# Patient Record
Sex: Male | Born: 1955 | Race: White | Hispanic: No | Marital: Married | Smoking: Never smoker
Health system: Southern US, Community
[De-identification: ages and names within clinical notes are randomized; demographics above are authoritative.]

## PROBLEM LIST (undated history)

## (undated) DIAGNOSIS — I10 Essential (primary) hypertension: Secondary | ICD-10-CM

## (undated) HISTORY — DX: Essential (primary) hypertension: I10

## (undated) HISTORY — PX: HERNIA REPAIR: SHX51

---

## 2008-01-05 ENCOUNTER — Encounter (INDEPENDENT_AMBULATORY_CARE_PROVIDER_SITE_OTHER): Payer: Self-pay | Admitting: General Surgery

## 2008-01-05 ENCOUNTER — Observation Stay (HOSPITAL_COMMUNITY): Admission: RE | Admit: 2008-01-05 | Discharge: 2008-01-08 | Payer: Self-pay | Admitting: General Surgery

## 2011-02-08 ENCOUNTER — Other Ambulatory Visit: Payer: Self-pay | Admitting: Gastroenterology

## 2011-02-23 NOTE — Consult Note (Signed)
Shane Sampson, Shane Sampson                 ACCOUNT NO.:  [id number]   MEDICAL RECORD NO.:  [id number]          PATIENT TYPE:  OIB   LOCATION:  1540                         FACILITY:  [facility]   PHYSICIAN:  Beckey Rutter, MD  DATE OF BIRTH:  05/02/1956   DATE OF CONSULTATION:  01/06/2008  DATE OF DISCHARGE:                                 CONSULTATION   PRIMARY CARE PHYSICIAN:  Unassigned to [facility].   REQUESTING PHYSICIAN:  Lorne Skeens. Hoxworth, M.D.   HISTORY:  Shane Sampson is a pleasant Caucasian male with past medical  history significant for hypertension who came in yesterday with rectal  bleeding and he underwent hemorrhoidectomy.  Since the operation the  patient was noticed to have extremely elevated blood pressure.  At  around 10:00 after surgery the patient's systolic blood pressure was 230  and diastolic was 110.  The patient had improvement in the systolic  blood pressure reading but the diastolic remained more than 100 all the  time.  Last blood pressure was 155/107.  The patient is known to have  hypertension and he was following up with his primary care physician who  increased the dose of Vasotec from 10 to 15 recently and the plan is to  follow up with her again to further adjust the blood pressure  medication.  The patient denied headache, sweating or any other  symptoms.   PAST MEDICAL HISTORY:  Significant for hypertension.   SOCIAL HISTORY:  The patient is married and lives with his wife.   PAST SURGICAL HISTORY:  Now postoperative day 1 is status post  hemorrhoidectomy.   MEDICATIONS:  Vasotec 15 mg daily.   REVIEW OF SYSTEMS:  Noncontributory.   PHYSICAL EXAMINATION:  VITAL SIGNS:  Recent vitals showing temperature  98.5, pulse 109, blood pressure is 155/107, saturation is 92.  HEENT:  Head atraumatic, normocephalic.  PERRL.  Mouth moist.  No ulcer.  NECK:  Supple.  No JVD.  HEART:  Precordium first and second heart sounds audible, no added  sound.  LUNGS:   Bilateral air entry.  ABDOMEN:  Soft, nontender.  Bowel sounds present.  EXTREMITIES:  No lower extremity edema.   LABS AND X-RAYS:  CBC done on January 05, 2008, showing white blood count  of 13.3, hematocrit is 44.8, hemoglobin is 8.1, platelet count of 396.  On January 05, 2008, his sodium is 141, potassium 4.2, chloride 110,  bicarb is 22, glucose 117, BUN is 31, creatinine is 1.94.  EKG done  January 05, 2008, was showing sinus bradycardia with ventricular rate 105.  There is questionable T wave in lead 2, 3 and aVF.   ASSESSMENT AND PLAN:  This is a 55 year old pleasant Caucasian status  post hemorrhoidectomy now with uncontrolled blood pressure.  The patient  has multiple reasons for elevated blood pressure on a background of  uncontrolled hypertension to start with.  The patient is on normal  saline fluid as well as the recent history of the surgical intervention.  Nevertheless, the patient is having some tachycardia as well with his  condition and for that  I will suggest to rule the patient out for acute  coronary syndrome.   For the blood pressure the patient currently is taking Vasotec 15 mg as  well as clonidine 0.2 mg p.o. twice a day.  I would continue on the  Vasotec at the current dose but I will add calcium channel blocker and  beta-blocker and hopefully the patient will be discharged on those  medications instead of the clonidine.  We will need to continue to  monitor blood pressure and adjust medication accordingly.   Next, the patient's creatinine is 1.94 which is obviously elevated as  well as the BUN 31.  The calculated glomerular filtration rate is 38%.  The patient obviously has renal insufficiency which could be acute but  given the history of the prolonged uncontrolled hypertension chronic  kidney disease is also possible in this setting.  I think the settling  factor for this is old records from his primary physician, this renal  imparement  is  obviously is a  contributory factor to his uncontrolled  hypertension.  I would suggest to continue on IV fluid hydration and the  patient can have further evaluation for chronic disease as well as  better control of blood pressure as an outpatient if the patient is  stable from the surgical point of view.   Thank you very much for the consultation.  We will follow through with  you.      Beckey Rutter, MD  Electronically Signed     EME/MEDQ  D:  01/06/2008  T:  01/06/2008  Job:  [id number]

## 2011-02-23 NOTE — Op Note (Signed)
Shane Sampson, FANCHER                 ACCOUNT NO.:  [id number]   MEDICAL RECORD NO.:  [id number]          PATIENT TYPE:  INP   LOCATION:  1540                         FACILITY:  [facility]   PHYSICIAN:  Sharlet Salina T. Hoxworth, M.D.DATE OF BIRTH:  08/22/1956   DATE OF PROCEDURE:  01/05/2008  DATE OF DISCHARGE:                               OPERATIVE REPORT   PREOPERATIVE DIAGNOSIS:  Severe thrombosed combination hemorrhoids with  prolapse.   POSTOPERATIVE DIAGNOSIS:  Severe thrombosed combination hemorrhoids with  prolapse.   SURGICAL PROCEDURE:  Extensive hemorrhoidectomy.   SURGEON:  Lorne Skeens. Hoxworth, M.D.   ANESTHESIA:  General.   BRIEF HISTORY:  [name] is a 55 year old male with only minimal  previous hemorrhoidal symptoms who presents with 5 days of gradually  worsening swelling, pain and now bleeding from hemorrhoids.  Examination  in the office revealed severe nearly circumferential thrombosed,  prolapsed, inflamed hemorrhoids with some bleeding as well.  This was  associated with severe pain.  We have recommended proceeding to the  operating room for urgent hemorrhoidectomy.  The nature of the  procedure, its indications, risks of bleeding, infection, expected  recovery were discussed and understood.  He was brought to the operating  room for this procedure.   DESCRIPTION OF OPERATION:  The patient was brought to the operating room  and placed in supine position on the operating room table and general  endotracheal anesthesia was induced.  He was carefully positioned in  lithotomy position and perineum sterilely prepped and draped.  He  received preoperative antibiotics.  External rectal exam revealed  severely edematous, thrombosed internal and external hemorrhoids with  prolapse.  The rectal retractor was placed and the anus gently dilated.  The hemorrhoids could be somewhat grouped into left lateral, right  anterior and right posterior groups with the left lateral  and the right  posterior being the most severe.  The right posterior group was  approached first.  A 3-0 chromic suture was placed in the rectum at the  apex of the hemorrhoidal group.  The hemorrhoidal group was then  elliptically excised out on to the anoderm and then carefully dissected  up off of the anal sphincter carefully protecting the sphincter.  There  were multiple large thromboses.  The group was completely excised.  Some  of the thrombosed hemorrhoids on either side were undermined slightly to  remove these and then the rectal mucosa and anoderm were closed with the  previously placed 3-0 chromic suture in running locking fashion  internally to externally.  Following this the left lateral group was  excised in an identical fashion.  The right anterior group was  significantly smaller and a more limited external excision was performed  here and closed with a running 3-0 chromic.  Blood loss was about 100  mL.  There did not appear to be any significant stenosis of the anal  canal  following the procedure.  All the suture lines were intact and without  bleeding.  A Gelfoam pack was placed.  A perianal block with Marcaine  was performed.  Sponges and  needle counts were correct.  The patient was  taken to recovery in satisfactory condition.      Lorne Skeens. Hoxworth, M.D.  Electronically Signed     BTH/MEDQ  D:  01/05/2008  T:  01/06/2008  Job:  [id number]

## 2011-07-05 LAB — BASIC METABOLIC PANEL
Calcium: 10
GFR calc Af Amer: 44 — ABNORMAL LOW
GFR calc non Af Amer: 37 — ABNORMAL LOW
GFR calc non Af Amer: 50 — ABNORMAL LOW
Potassium: 3.5
Potassium: 4.2
Sodium: 139
Sodium: 141

## 2011-07-05 LAB — CARDIAC PANEL(CRET KIN+CKTOT+MB+TROPI)
CK, MB: 0.9
CK, MB: 1.2
CK, MB: 1.7
Relative Index: 0.7
Relative Index: 0.7
Relative Index: 1.3
Relative Index: INVALID
Total CK: 161
Total CK: 173
Total CK: 83
Total CK: 97
Troponin I: 0.01
Troponin I: 0.02

## 2011-07-05 LAB — CBC
Hemoglobin: 12.5 — ABNORMAL LOW
Hemoglobin: 16.1
Platelets: 287
RBC: 5.11
RDW: 12.7
RDW: 13
WBC: 11.5 — ABNORMAL HIGH
WBC: 13.3 — ABNORMAL HIGH

## 2015-01-27 ENCOUNTER — Ambulatory Visit (INDEPENDENT_AMBULATORY_CARE_PROVIDER_SITE_OTHER): Payer: 59 | Admitting: Internal Medicine

## 2015-01-27 ENCOUNTER — Ambulatory Visit (INDEPENDENT_AMBULATORY_CARE_PROVIDER_SITE_OTHER): Payer: 59

## 2015-01-27 VITALS — BP 120/82 | HR 112 | Temp 98.2°F | Resp 18 | Ht 73.0 in | Wt 179.0 lb

## 2015-01-27 DIAGNOSIS — R059 Cough, unspecified: Secondary | ICD-10-CM

## 2015-01-27 DIAGNOSIS — J189 Pneumonia, unspecified organism: Secondary | ICD-10-CM

## 2015-01-27 DIAGNOSIS — R05 Cough: Secondary | ICD-10-CM | POA: Diagnosis not present

## 2015-01-27 DIAGNOSIS — R61 Generalized hyperhidrosis: Secondary | ICD-10-CM | POA: Diagnosis not present

## 2015-01-27 DIAGNOSIS — R6883 Chills (without fever): Secondary | ICD-10-CM

## 2015-01-27 DIAGNOSIS — H109 Unspecified conjunctivitis: Secondary | ICD-10-CM

## 2015-01-27 LAB — POCT CBC
GRANULOCYTE PERCENT: 89.1 % — AB (ref 37–80)
HEMATOCRIT: 49.5 % (ref 43.5–53.7)
HEMOGLOBIN: 17.1 g/dL (ref 14.1–18.1)
Lymph, poc: 1.8 (ref 0.6–3.4)
MCH: 29.9 pg (ref 27–31.2)
MCHC: 34.6 g/dL (ref 31.8–35.4)
MCV: 86.5 fL (ref 80–97)
MID (CBC): 0.4 (ref 0–0.9)
MPV: 7.1 fL (ref 0–99.8)
PLATELET COUNT, POC: 509 10*3/uL — AB (ref 142–424)
POC Granulocyte: 17.8 — AB (ref 2–6.9)
POC LYMPH PERCENT: 8.9 %L — AB (ref 10–50)
POC MID %: 2 %M (ref 0–12)
RBC: 5.72 M/uL (ref 4.69–6.13)
RDW, POC: 13.3 %
WBC: 20 10*3/uL — AB (ref 4.6–10.2)

## 2015-01-27 LAB — POCT INFLUENZA A/B
INFLUENZA B, POC: NEGATIVE
Influenza A, POC: NEGATIVE

## 2015-01-27 MED ORDER — CEFTRIAXONE SODIUM 1 G IJ SOLR
1.0000 g | Freq: Once | INTRAMUSCULAR | Status: AC
  Administered 2015-01-27: 1 g via INTRAMUSCULAR

## 2015-01-27 MED ORDER — OFLOXACIN 0.3 % OP SOLN: 1.0000 [drp] | OPHTHALMIC | Status: DC

## 2015-01-27 MED ORDER — AZITHROMYCIN 500 MG PO TABS: 500.0000 mg | ORAL_TABLET | Freq: Every day | ORAL | Status: DC

## 2015-01-27 MED ORDER — HYDROCODONE-ACETAMINOPHEN 7.5-325 MG/15ML PO SOLN: 5.0000 mL | Freq: Four times a day (QID) | ORAL | Status: DC | PRN

## 2015-01-27 NOTE — Patient Instructions (Signed)
Bacterial Conjunctivitis Bacterial conjunctivitis, commonly called pink eye, is an inflammation of the clear membrane that covers the white part of the eye (conjunctiva). The inflammation can also happen on the underside of the eyelids. The blood vessels in the conjunctiva become inflamed, causing the eye to become red or pink. Bacterial conjunctivitis may spread easily from one eye to another and from person to person (contagious).  CAUSES  Bacterial conjunctivitis is caused by bacteria. The bacteria may come from your own skin, your upper respiratory tract, or from someone else with bacterial conjunctivitis. SYMPTOMS  The normally white color of the eye or the underside of the eyelid is usually pink or red. The pink eye is usually associated with irritation, tearing, and some sensitivity to light. Bacterial conjunctivitis is often associated with a thick, yellowish discharge from the eye. The discharge may turn into a crust on the eyelids overnight, which causes your eyelids to stick together. If a discharge is present, there may also be some blurred vision in the affected eye. DIAGNOSIS  Bacterial conjunctivitis is diagnosed by your caregiver through an eye exam and the symptoms that you report. Your caregiver looks for changes in the surface tissues of your eyes, which may point to the specific type of conjunctivitis. A sample of any discharge may be collected on a cotton-tip swab if you have a severe case of conjunctivitis, if your cornea is affected, or if you keep getting repeat infections that do not respond to treatment. The sample will be sent to a lab to see if the inflammation is caused by a bacterial infection and to see if the infection will respond to antibiotic medicines. TREATMENT   Bacterial conjunctivitis is treated with antibiotics. Antibiotic eyedrops are most often used. However, antibiotic ointments are also available. Antibiotics pills are sometimes used. Artificial tears or eye  washes may ease discomfort. HOME CARE INSTRUCTIONS   To ease discomfort, apply a cool, clean washcloth to your eye for 10-20 minutes, 3-4 times a day.  Gently wipe away any drainage from your eye with a warm, wet washcloth or a cotton ball.  Wash your hands often with soap and water. Use paper towels to dry your hands.  Do not share towels or washcloths. This may spread the infection.  Change or wash your pillowcase every day.  You should not use eye makeup until the infection is gone.  Do not operate machinery or drive if your vision is blurred.  Stop using contact lenses. Ask your caregiver how to sterilize or replace your contacts before using them again. This depends on the type of contact lenses that you use.  When applying medicine to the infected eye, do not touch the edge of your eyelid with the eyedrop bottle or ointment tube. SEEK IMMEDIATE MEDICAL CARE IF:   Your infection has not improved within 3 days after beginning treatment.  You had yellow discharge from your eye and it returns.  You have increased eye pain.  Your eye redness is spreading.  Your vision becomes blurred.  You have a fever or persistent symptoms for more than 2-3 days.  You have a fever and your symptoms suddenly get worse.  You have facial pain, redness, or swelling. MAKE SURE YOU:   Understand these instructions.  Will watch your condition.  Will get help right away if you are not doing well or get worse. Document Released: 09/27/2005 Document Revised: 02/11/2014 Document Reviewed: 02/28/2012 [employer] Patient Information 2015 [employer]. This information is not intended to   replace advice given to you by your health care provider. Make sure you discuss any questions you have with your health care provider. Pneumonia Pneumonia is an infection of the lungs.  CAUSES Pneumonia may be caused by bacteria or a virus. Usually, these infections are caused by breathing infectious particles  into the lungs (respiratory tract). SIGNS AND SYMPTOMS   Cough.  Fever.  Chest pain.  Increased rate of breathing.  Wheezing.  Mucus production. DIAGNOSIS  If you have the common symptoms of pneumonia, your health care provider will typically confirm the diagnosis with a chest X-ray. The X-ray will show an abnormality in the lung (pulmonary infiltrate) if you have pneumonia. Other tests of your blood, urine, or sputum may be done to find the specific cause of your pneumonia. Your health care provider may also do tests (blood gases or pulse oximetry) to see how well your lungs are working. TREATMENT  Some forms of pneumonia may be spread to other people when you cough or sneeze. You may be asked to wear a mask before and during your exam. Pneumonia that is caused by bacteria is treated with antibiotic medicine. Pneumonia that is caused by the influenza virus may be treated with an antiviral medicine. Most other viral infections must run their course. These infections will not respond to antibiotics.  HOME CARE INSTRUCTIONS   Cough suppressants may be used if you are losing too much rest. However, coughing protects you by clearing your lungs. You should avoid using cough suppressants if you can.  Your health care provider may have prescribed medicine if he or she thinks your pneumonia is caused by bacteria or influenza. Finish your medicine even if you start to feel better.  Your health care provider may also prescribe an expectorant. This loosens the mucus to be coughed up.  Take medicines only as directed by your health care provider.  Do not smoke. Smoking is a common cause of bronchitis and can contribute to pneumonia. If you are a smoker and continue to smoke, your cough may last several weeks after your pneumonia has cleared.  A cold steam vaporizer or humidifier in your room or home may help loosen mucus.  Coughing is often worse at night. Sleeping in a semi-upright position in a  recliner or using a couple pillows under your head will help with this.  Get rest as you feel it is needed. Your body will usually let you know when you need to rest. PREVENTION A pneumococcal shot (vaccine) is available to prevent a common bacterial cause of pneumonia. This is usually suggested for:  People over 19 years old.  Patients on chemotherapy.  People with chronic lung problems, such as bronchitis or emphysema.  People with immune system problems. If you are over 65 or have a high risk condition, you may receive the pneumococcal vaccine if you have not received it before. In some countries, a routine influenza vaccine is also recommended. This vaccine can help prevent some cases of pneumonia.You may be offered the influenza vaccine as part of your care. If you smoke, it is time to quit. You may receive instructions on how to stop smoking. Your health care provider can provide medicines and counseling to help you quit. SEEK MEDICAL CARE IF: You have a fever. SEEK IMMEDIATE MEDICAL CARE IF:   Your illness becomes worse. This is especially true if you are elderly or weakened from any other disease.  You cannot control your cough with suppressants and are losing sleep.  You begin coughing up blood.  You develop pain which is getting worse or is uncontrolled with medicines.  Any of the symptoms which initially brought you in for treatment are getting worse rather than better.  You develop shortness of breath or chest pain. MAKE SURE YOU:   Understand these instructions.  Will watch your condition.  Will get help right away if you are not doing well or get worse. Document Released: 09/27/2005 Document Revised: 02/11/2014 Document Reviewed: 12/17/2010 [facility] Patient Information 2015 [place], [place]. This information is not intended to replace advice given to you by your health care provider. Make sure you discuss any questions you have with your health care provider.

## 2015-01-27 NOTE — Progress Notes (Signed)
   Subjective:    Patient ID: DEIDRICK RAINEY, male    DOB: January 02, 1956, 59 y.o.   MRN: [id number]  HPI  59 year old male   Complains of cough,headache,right eye redness with puss (grey Willmann sputum with sinus discharge.  3 days No sob or syncope, does have cp with cough and copius Bukowski sputum. Had normal 59 year old CPE this year.    Review of Systems     Objective:   Physical Exam  Constitutional: He is oriented to person, place, and time. He appears well-developed and well-nourished. He appears distressed.  HENT:  Head: Normocephalic.  Right Ear: External ear normal.  Left Ear: External ear normal.  Nose: Mucosal edema, rhinorrhea and sinus tenderness present. No epistaxis. Right sinus exhibits frontal sinus tenderness. Right sinus exhibits no maxillary sinus tenderness. Left sinus exhibits frontal sinus tenderness. Left sinus exhibits no maxillary sinus tenderness.  Mouth/Throat: Oropharynx is clear and moist.  Eyes: EOM are normal. Pupils are equal, round, and reactive to light. Right eye exhibits discharge and exudate. Left eye exhibits no discharge and no exudate. Right conjunctiva is injected. Left conjunctiva is not injected.  Neck: Normal range of motion.  Cardiovascular: Normal rate, regular rhythm and normal heart sounds.   Pulmonary/Chest: Effort normal. No respiratory distress. He has decreased breath sounds. He has no wheezes. He has rhonchi. He has no rales. He exhibits tenderness.  Musculoskeletal: Normal range of motion.  Lymphadenopathy:    He has cervical adenopathy.  Neurological: He is alert and oriented to person, place, and time. He exhibits normal muscle tone. Coordination normal.  Psychiatric: He has a normal mood and affect. His behavior is normal. Thought content normal.   Results for orders placed or performed in visit on 01/27/15  POCT CBC  Result Value Ref Range   WBC 20.0 (A) 4.6 - 10.2 K/uL   Lymph, poc 1.8 0.6 - 3.4   POC LYMPH PERCENT 8.9 (A) 10  - 50 %L   MID (cbc) 0.4 0 - 0.9   POC MID % 2.0 0 - 12 %M   POC Granulocyte 17.8 (A) 2 - 6.9   Granulocyte percent 89.1 (A) 37 - 80 %G   RBC 5.72 4.69 - 6.13 M/uL   Hemoglobin 17.1 14.1 - 18.1 g/dL   HCT, POC 49.5 43.5 - 53.7 %   MCV 86.5 80 - 97 fL   MCH, POC 29.9 27 - 31.2 pg   MCHC 34.6 31.8 - 35.4 g/dL   RDW, POC 13.3 %   Platelet Count, POC 509 (A) 142 - 424 K/uL   MPV 7.1 0 - 99.8 fL  POCT Influenza A/B  Result Value Ref Range   Influenza A, POC Negative    Influenza B, POC Negative    [facility] reading (PRIMARY) by  Dr.Dajane Valli infiltrates bilateral suggestive of pneumonia.         Assessment & Plan:  Bronchitis/Conjunctivitis/Pneumonitis Rocephin 1g/Zithromax 500mg /ocuflox eye

## 2015-01-29 ENCOUNTER — Ambulatory Visit (INDEPENDENT_AMBULATORY_CARE_PROVIDER_SITE_OTHER): Payer: 59 | Admitting: Internal Medicine

## 2015-01-29 ENCOUNTER — Encounter: Payer: Self-pay | Admitting: Internal Medicine

## 2015-01-29 VITALS — BP 122/78 | HR 77 | Temp 98.1°F | Resp 18 | Ht 73.0 in | Wt 178.8 lb

## 2015-01-29 DIAGNOSIS — J159 Unspecified bacterial pneumonia: Secondary | ICD-10-CM | POA: Diagnosis not present

## 2015-01-29 LAB — POCT CBC
Granulocyte percent: 81.3 %G — AB (ref 37–80)
HCT, POC: 48.4 % (ref 43.5–53.7)
Hemoglobin: 16.4 g/dL (ref 14.1–18.1)
Lymph, poc: 1.6 (ref 0.6–3.4)
MCH: 29.8 pg (ref 27–31.2)
MCHC: 33.8 g/dL (ref 31.8–35.4)
MCV: 88 fL (ref 80–97)
MID (CBC): 0.5 (ref 0–0.9)
MPV: 7.1 fL (ref 0–99.8)
PLATELET COUNT, POC: 535 10*3/uL — AB (ref 142–424)
POC Granulocyte: 9.1 — AB (ref 2–6.9)
POC LYMPH PERCENT: 13.9 %L (ref 10–50)
POC MID %: 4.8 %M (ref 0–12)
RBC: 5.5 M/uL (ref 4.69–6.13)
RDW, POC: 12.6 %
WBC: 11.2 10*3/uL — AB (ref 4.6–10.2)

## 2015-01-29 NOTE — Progress Notes (Signed)
   Subjective:    Patient ID: Shane Sampson, male    DOB: September 23, 1956, 59 y.o.   MRN: [id number]  HPI  Mr turman is following up today for pneumonia. He had an  with an elevated white count of 20k . He feels much better but tired. Chills are gone.  Less cough, minimal scant sputum, feeling better. No chest pain Review of Systems     Objective:   Physical Exam  Constitutional: He is oriented to person, place, and time. He appears well-developed and well-nourished. No distress.  HENT:  Head: Normocephalic.  Mouth/Throat: Oropharynx is clear and moist.  Eyes: EOM are normal. Pupils are equal, round, and reactive to light. No scleral icterus.  Neck: Normal range of motion. Neck supple.  Cardiovascular: Normal rate, regular rhythm and normal heart sounds.   Pulmonary/Chest: Effort normal and breath sounds normal. No respiratory distress. He has no wheezes. He has no rales. He exhibits no tenderness.  Musculoskeletal: Normal range of motion.  Neurological: He is alert and oriented to person, place, and time. He exhibits normal muscle tone. Coordination normal.  Psychiatric: He has a normal mood and affect. His behavior is normal.   Results for orders placed or performed in visit on 01/29/15  POCT CBC  Result Value Ref Range   WBC 11.2 (A) 4.6 - 10.2 K/uL   Lymph, poc 1.6 0.6 - 3.4   POC LYMPH PERCENT 13.9 10 - 50 %L   MID (cbc) 0.5 0 - 0.9   POC MID % 4.8 0 - 12 %M   POC Granulocyte 9.1 (A) 2 - 6.9   Granulocyte percent 81.3 (A) 37 - 80 %G   RBC 5.50 4.69 - 6.13 M/uL   Hemoglobin 16.4 14.1 - 18.1 g/dL   HCT, POC 48.4 43.5 - 53.7 %   MCV 88.0 80 - 97 fL   MCH, POC 29.8 27 - 31.2 pg   MCHC 33.8 31.8 - 35.4 g/dL   RDW, POC 12.6 %   Platelet Count, POC 535 (A) 142 - 424 K/uL   MPV 7.1 0 - 99.8 fL          Assessment & Plan:  Resolving pneumonia/Finish zithromax CXR needs repeat 1 month he agrees CBC

## 2015-01-29 NOTE — Patient Instructions (Signed)
Repeat CXR in 1 month  Immunization Schedule, Adult  Influenza vaccine.  All adults should be immunized every year.  All adults, including pregnant women and people with hives-only allergy to eggs can receive the inactivated influenza (IIV) vaccine.  Adults aged 60-49 years can receive the recombinant influenza (RIV) vaccine. The RIV vaccine does not contain any egg protein.  Adults aged 64 years or older can receive the standard-dose IIV or the high-dose IIV.  Tetanus, diphtheria, and acellular pertussis (Td, Tdap) vaccine.  Pregnant women should receive 1 dose of Tdap vaccine during each pregnancy. The dose should be obtained regardless of the length of time since the last dose. Immunization is preferred during the 27th to 36th week of gestation.  An adult who has not previously received Tdap or who does not know his or her vaccine status should receive 1 dose of Tdap. This initial dose should be followed by tetanus and diphtheria toxoids (Td) booster doses every 10 years.  Adults with an unknown or incomplete history of completing a 3-dose immunization series with Td-containing vaccines should begin or complete a primary immunization series including a Tdap dose.  Adults should receive a Td booster every 10 years.  Varicella vaccine.  An adult without evidence of immunity to varicella should receive 2 doses or a second dose if he or she has previously received 1 dose.  Pregnant females who do not have evidence of immunity should receive the first dose after pregnancy. This first dose should be obtained before leaving the health care facility. The second dose should be obtained 4-8 weeks after the first dose.  Human papillomavirus (HPV) vaccine.  Females aged 13-26 years who have not received the vaccine previously should obtain the 3-dose series.  The vaccine is not recommended for use in pregnant females. However, pregnancy testing is not needed before receiving a dose. If a  male is found to be pregnant after receiving a dose, no treatment is needed. In that case, the remaining doses should be delayed until after the pregnancy.  Males aged 38-21 years who have not received the vaccine previously should receive the 3-dose series. Males aged 22-26 years may be immunized.  Immunization is recommended through the age of 36 years for any male who has sex with males and did not get any or all doses earlier.  Immunization is recommended for any person with an immunocompromised condition through the age of 37 years if he or she did not get any or all doses earlier.  During the 3-dose series, the second dose should be obtained 4-8 weeks after the first dose. The third dose should be obtained 24 weeks after the first dose and 16 weeks after the second dose.  Zoster vaccine.  One dose is recommended for adults aged 49 years or older unless certain conditions are present.  Measles, mumps, and rubella (MMR) vaccine.  Adults born before [birth year] generally are considered immune to measles and mumps.  Adults born in [birth year] or later should have 1 or more doses of MMR vaccine unless there is a contraindication to the vaccine or there is laboratory evidence of immunity to each of the three diseases.  A routine second dose of MMR vaccine should be obtained at least 28 days after the first dose for students attending postsecondary schools, health care workers, or international travelers.  People who received inactivated measles vaccine or an unknown type of measles vaccine during 1963-1967 should receive 2 doses of MMR vaccine.  People who received inactivated  mumps vaccine or an unknown type of mumps vaccine before 1979 and are at high risk for mumps infection should consider immunization with 2 doses of MMR vaccine.  For females of childbearing age, rubella immunity should be determined. If there is no evidence of immunity, females who are not pregnant should be vaccinated. If there  is no evidence of immunity, females who are pregnant should delay immunization until after pregnancy.  Unvaccinated health care workers born before [birth year] who lack laboratory evidence of measles, mumps, or rubella immunity or laboratory confirmation of disease should consider measles and mumps immunization with 2 doses of MMR vaccine or rubella immunization with 1 dose of MMR vaccine.  Pneumococcal 13-valent conjugate (PCV13) vaccine.  When indicated, a person who is uncertain of his or her immunization history and has no record of immunization should receive the PCV13 vaccine.  An adult aged 70 years or older who has certain medical conditions and has not been previously immunized should receive 1 dose of PCV13 vaccine. This PCV13 should be followed with a dose of pneumococcal polysaccharide (PPSV23) vaccine. The PPSV23 vaccine dose should be obtained at least 8 weeks after the dose of PCV13 vaccine.  An adult aged 62 years or older who has certain medical conditions and previously received 1 or more doses of PPSV23 vaccine should receive 1 dose of PCV13. The PCV13 vaccine dose should be obtained 1 or more years after the last PPSV23 vaccine dose.  Pneumococcal polysaccharide (PPSV23) vaccine.  When PCV13 is also indicated, PCV13 should be obtained first.  All adults aged 64 years and older should be immunized.  An adult younger than age 42 years who has certain medical conditions should be immunized.  Any person who resides in a nursing home or long-term care facility should be immunized.  An adult smoker should be immunized.  People with an immunocompromised condition and certain other conditions should receive both PCV13 and PPSV23 vaccines.  People with human immunodeficiency virus (HIV) infection should be immunized as soon as possible after diagnosis.  Immunization during chemotherapy or radiation therapy should be avoided.  Routine use of PPSV23 vaccine is not recommended for  American Indians, [place] Natives, or people younger than 65 years unless there are medical conditions that require PPSV23 vaccine.  When indicated, people who have unknown immunization and have no record of immunization should receive PPSV23 vaccine.  One-time revaccination 5 years after the first dose of PPSV23 is recommended for people aged 19-64 years who have chronic kidney failure, nephrotic syndrome, asplenia, or immunocompromised conditions.  People who received 1-2 doses of PPSV23 before age 65 years should receive another dose of PPSV23 vaccine at age 45 years or later if at least 5 years have passed since the previous dose.  Doses of PPSV23 are not needed for people immunized with PPSV23 at or after age 86 years.  Meningococcal vaccine.  Adults with asplenia or persistent complement component deficiencies should receive 2 doses of quadrivalent meningococcal conjugate (MenACWY-D) vaccine. The doses should be obtained at least 2 months apart.  Microbiologists working with certain meningococcal bacteria, [place] recruits, people at risk during an outbreak, and people who travel to or live in countries with a high rate of meningitis should be immunized.  A first-year college student up through age 37 years who is living in a residence hall should receive a dose if he or she did not receive a dose on or after his or her 16th birthday.  Adults who have certain high-risk conditions  should receive one or more doses of vaccine.  Hepatitis A vaccine.  Adults who wish to be protected from this disease, have certain high-risk conditions, work with hepatitis A-infected animals, work in hepatitis A research labs, or travel to or work in countries with a high rate of hepatitis A should be immunized.  Adults who were previously unvaccinated and who anticipate close contact with an international adoptee during the first 60 days after arrival in the [place] from a country with a high rate of  hepatitis A should be immunized.  Hepatitis B vaccine.  Adults who wish to be protected from this disease, have certain high-risk conditions, may be exposed to blood or other infectious body fluids, are household contacts or sex partners of hepatitis B positive people, are clients or workers in certain care facilities, or travel to or work in countries with a high rate of hepatitis B should be immunized.  Haemophilus influenzae type b (Hib) vaccine.  A previously unvaccinated person with asplenia or sickle cell disease or having a scheduled splenectomy should receive 1 dose of Hib vaccine.  Regardless of previous immunization, a recipient of a hematopoietic stem cell transplant should receive a 3-dose series 6-12 months after his or her successful transplant.  Hib vaccine is not recommended for adults with HIV infection. Document Released: 12/18/2003 Document Revised: 01/22/2013 Document Reviewed: 11/14/2012 [facility] Patient Information 2015 [place], [place]. This information is not intended to replace advice given to you by your health care provider. Make sure you discuss any questions you have with your health care provider.

## 2017-09-07 ENCOUNTER — Other Ambulatory Visit: Payer: Self-pay | Admitting: Internal Medicine

## 2017-09-14 ENCOUNTER — Ambulatory Visit
Admission: RE | Admit: 2017-09-14 | Discharge: 2017-09-14 | Disposition: A | Discharge status: Home / Self Care | Payer: 59 | Source: Ambulatory Visit | Attending: Internal Medicine | Admitting: Internal Medicine

## 2018-02-14 DIAGNOSIS — Z23 Encounter for immunization: Secondary | ICD-10-CM | POA: Diagnosis not present

## 2018-08-16 DIAGNOSIS — Z23 Encounter for immunization: Secondary | ICD-10-CM | POA: Diagnosis not present

## 2018-09-11 DIAGNOSIS — D2261 Melanocytic nevi of right upper limb, including shoulder: Secondary | ICD-10-CM | POA: Diagnosis not present

## 2018-09-11 DIAGNOSIS — L570 Actinic keratosis: Secondary | ICD-10-CM | POA: Diagnosis not present

## 2018-09-11 DIAGNOSIS — D0462 Carcinoma in situ of skin of left upper limb, including shoulder: Secondary | ICD-10-CM | POA: Diagnosis not present

## 2018-09-11 DIAGNOSIS — D225 Melanocytic nevi of trunk: Secondary | ICD-10-CM | POA: Diagnosis not present

## 2018-09-11 DIAGNOSIS — D2262 Melanocytic nevi of left upper limb, including shoulder: Secondary | ICD-10-CM | POA: Diagnosis not present

## 2018-09-19 DIAGNOSIS — E785 Hyperlipidemia, unspecified: Secondary | ICD-10-CM | POA: Diagnosis not present

## 2018-09-19 DIAGNOSIS — Z125 Encounter for screening for malignant neoplasm of prostate: Secondary | ICD-10-CM | POA: Diagnosis not present

## 2018-09-19 DIAGNOSIS — R7301 Impaired fasting glucose: Secondary | ICD-10-CM | POA: Diagnosis not present

## 2018-09-19 DIAGNOSIS — R82998 Other abnormal findings in urine: Secondary | ICD-10-CM | POA: Diagnosis not present

## 2018-09-19 DIAGNOSIS — I10 Essential (primary) hypertension: Secondary | ICD-10-CM | POA: Diagnosis not present

## 2018-09-26 DIAGNOSIS — Z0000 Encounter for general adult medical examination without abnormal findings: Secondary | ICD-10-CM | POA: Diagnosis not present

## 2018-09-26 DIAGNOSIS — K8020 Calculus of gallbladder without cholecystitis without obstruction: Secondary | ICD-10-CM | POA: Diagnosis not present

## 2018-09-26 DIAGNOSIS — Z1389 Encounter for screening for other disorder: Secondary | ICD-10-CM | POA: Diagnosis not present

## 2018-09-26 DIAGNOSIS — K760 Fatty (change of) liver, not elsewhere classified: Secondary | ICD-10-CM | POA: Diagnosis not present

## 2018-09-26 DIAGNOSIS — Z1212 Encounter for screening for malignant neoplasm of rectum: Secondary | ICD-10-CM | POA: Diagnosis not present

## 2018-09-26 DIAGNOSIS — R9431 Abnormal electrocardiogram [ECG] [EKG]: Secondary | ICD-10-CM | POA: Diagnosis not present

## 2018-09-27 ENCOUNTER — Telehealth (HOSPITAL_COMMUNITY): Payer: Self-pay | Admitting: *Deleted

## 2018-09-27 NOTE — Telephone Encounter (Addendum)
09/27/18 attempted to reach pt to schedule - 09/27/2018 msg left asking for return call to schedule

## 2018-10-03 ENCOUNTER — Telehealth (HOSPITAL_COMMUNITY): Payer: Self-pay | Admitting: *Deleted

## 2018-10-03 NOTE — Telephone Encounter (Signed)
10/03/2018 pt left msg stating he will call after 10/09/2018 to schedule appt.

## 2018-10-13 ENCOUNTER — Ambulatory Visit (HOSPITAL_COMMUNITY)
Admission: RE | Admit: 2018-10-13 | Discharge: 2018-10-13 | Disposition: A | Discharge status: Home / Self Care | Payer: 59 | Source: Ambulatory Visit | Attending: Family | Admitting: Family

## 2018-10-13 ENCOUNTER — Other Ambulatory Visit (HOSPITAL_COMMUNITY): Payer: Self-pay | Admitting: Internal Medicine

## 2018-10-13 DIAGNOSIS — N183 Chronic kidney disease, stage 3 unspecified: Secondary | ICD-10-CM

## 2018-10-30 DIAGNOSIS — I10 Essential (primary) hypertension: Secondary | ICD-10-CM | POA: Diagnosis not present

## 2019-02-06 DIAGNOSIS — I129 Hypertensive chronic kidney disease with stage 1 through stage 4 chronic kidney disease, or unspecified chronic kidney disease: Secondary | ICD-10-CM | POA: Diagnosis not present

## 2019-02-06 DIAGNOSIS — R809 Proteinuria, unspecified: Secondary | ICD-10-CM | POA: Diagnosis not present

## 2019-02-06 DIAGNOSIS — N183 Chronic kidney disease, stage 3 (moderate): Secondary | ICD-10-CM | POA: Diagnosis not present

## 2019-02-07 DIAGNOSIS — N183 Chronic kidney disease, stage 3 (moderate): Secondary | ICD-10-CM | POA: Diagnosis not present

## 2019-02-14 ENCOUNTER — Other Ambulatory Visit: Payer: Self-pay | Admitting: Nephrology

## 2019-02-14 DIAGNOSIS — N183 Chronic kidney disease, stage 3 unspecified: Secondary | ICD-10-CM

## 2019-02-26 ENCOUNTER — Ambulatory Visit
Admission: RE | Admit: 2019-02-26 | Discharge: 2019-02-26 | Disposition: A | Discharge status: Home / Self Care | Payer: 59 | Source: Ambulatory Visit | Attending: Nephrology | Admitting: Nephrology

## 2019-02-26 DIAGNOSIS — N183 Chronic kidney disease, stage 3 unspecified: Secondary | ICD-10-CM

## 2019-11-14 ENCOUNTER — Other Ambulatory Visit: Payer: Self-pay | Admitting: Internal Medicine

## 2019-11-14 DIAGNOSIS — E785 Hyperlipidemia, unspecified: Secondary | ICD-10-CM

## 2019-12-31 ENCOUNTER — Ambulatory Visit
Admission: RE | Admit: 2019-12-31 | Discharge: 2019-12-31 | Disposition: A | Discharge status: Home / Self Care | Payer: No Typology Code available for payment source | Source: Ambulatory Visit | Attending: Internal Medicine | Admitting: Internal Medicine

## 2019-12-31 DIAGNOSIS — E785 Hyperlipidemia, unspecified: Secondary | ICD-10-CM

## 2020-01-02 ENCOUNTER — Encounter: Payer: Self-pay | Admitting: Cardiology

## 2020-01-02 ENCOUNTER — Ambulatory Visit (INDEPENDENT_AMBULATORY_CARE_PROVIDER_SITE_OTHER): Payer: 59 | Admitting: Cardiology

## 2020-01-02 ENCOUNTER — Other Ambulatory Visit: Payer: Self-pay

## 2020-01-02 VITALS — BP 148/82 | HR 67 | Ht 72.0 in | Wt 180.8 lb

## 2020-01-02 DIAGNOSIS — R9431 Abnormal electrocardiogram [ECG] [EKG]: Secondary | ICD-10-CM

## 2020-01-02 DIAGNOSIS — I10 Essential (primary) hypertension: Secondary | ICD-10-CM

## 2020-01-02 DIAGNOSIS — I2510 Atherosclerotic heart disease of native coronary artery without angina pectoris: Secondary | ICD-10-CM | POA: Diagnosis not present

## 2020-01-02 DIAGNOSIS — R931 Abnormal findings on diagnostic imaging of heart and coronary circulation: Secondary | ICD-10-CM

## 2020-01-02 DIAGNOSIS — E785 Hyperlipidemia, unspecified: Secondary | ICD-10-CM

## 2020-01-02 NOTE — Patient Instructions (Signed)
Medication Instructions:  Your physician recommends that you continue on your current medications as directed. Please refer to the Current Medication list given to you today.  Lab Work: NONE  Testing/Procedures: Your physician has requested that you have an echocardiogram. Echocardiography is a painless test that uses sound waves to create images of your heart. It provides your doctor with information about the size and shape of your heart and how well your heart's chambers and valves are working. This procedure takes approximately one hour. There are no restrictions for this procedure.  This will be done at our [facility] location:  [place] has requested that you have a lexiscan myoview. For further information please visit [email or web address]. Please follow instruction sheet, as given.   Follow-Up: At [facility], you and your health needs are our priority.  As part of our continuing mission to provide you with exceptional heart care, we have created designated Provider Care Teams.  These Care Teams include your primary Cardiologist (physician) and Advanced Practice Providers (APPs -  Physician Assistants and Nurse Practitioners) who all work together to provide you with the care you need, when you need it.  We recommend signing up for the patient portal called "MyChart".  Sign up information is provided on this After Visit Summary.  MyChart is used to connect with patients for Virtual Visits (Telemedicine).  Patients are able to view lab/test results, encounter notes, upcoming appointments, etc.  Non-urgent messages can be sent to your provider as well.   To learn more about what you can do with MyChart, go to [email or web address].    Your next appointment:   2 month(s)  The format for your next appointment:   Either In Person or Virtual  Provider:   Oswaldo Milian, MD

## 2020-01-02 NOTE — Progress Notes (Signed)
Cardiology Office Note:    Date:  01-05-20   ID:  Shane Sampson, DOB [birth year], MRN [id number]  PCP:  Crist Infante, MD  Cardiologist:  No primary care provider on file.  Electrophysiologist:  None   Referring MD: Crist Infante, MD   Chief Complaint  Patient presents with  . Coronary Artery Disease    History of Present Illness:    Shane Sampson is a 64 y.o. male with a hx of hypertension, hyperlipidemia, CKD who is referred by Dr. Joylene Draft for evaluation of coronary artery disease.  He underwent a calcium score in 12/29/2019, which showed calcium score 1065 (94th percentile).  His atorvastatin was increased from 10 mg to 40 mg and he was referred to cardiology for further evaluation.  He reports that he is active, golfing 18 holes every Saturday and 05-Jan-2023, in addition to golfing 6-7 holes several times during the week.  He walks the golf course and carries his bag, denies any chest pain or dyspnea.  Smoked 1 pack/day for 6 years, quit in 01-05-1979.  Father died of brain anuerysm rupture at 33.  Mother had MI at 19.      Past Medical History:  Diagnosis Date  . Hypertension     Past Surgical History:  Procedure Laterality Date  . HERNIA REPAIR      Current Medications: Current Meds  Medication Sig  . amLODipine (NORVASC) 10 MG tablet Take 10 mg by mouth daily.  Marland Kitchen aspirin 81 MG tablet Take 81 mg by mouth daily.  Marland Kitchen atorvastatin (LIPITOR) 40 MG tablet Take 40 mg by mouth daily.  . enalapril (VASOTEC) 20 MG tablet Take 20 mg by mouth daily.  . hydrALAZINE (APRESOLINE) 25 MG tablet Take 25 mg by mouth 3 (three) times daily.  . metoprolol (LOPRESSOR) 100 MG tablet Take 100 mg by mouth daily.     Allergies:   Patient has no known allergies.   Social History   Socioeconomic History  . Marital status: Married    Spouse name: Not on file  . Number of children: Not on file  . Years of education: Not on file  . Highest education level: Not on file  Occupational History  . Not on  file  Tobacco Use  . Smoking status: Never Smoker  . Smokeless tobacco: Never Used  Substance and Sexual Activity  . Alcohol use: Yes    Alcohol/week: 1.0 standard drinks    Types: 1 Standard drinks or equivalent per week  . Drug use: No  . Sexual activity: Not on file  Other Topics Concern  . Not on file  Social History Narrative  . Not on file   Social Determinants of Health   Financial Resource Strain:   . Difficulty of Paying Living Expenses:   Food Insecurity:   . Worried About Charity fundraiser in the Last Year:   . Arboriculturist in the Last Year:   Transportation Needs:   . Film/video editor (Medical):   Marland Kitchen Lack of Transportation (Non-Medical):   Physical Activity:   . Days of Exercise per Week:   . Minutes of Exercise per Session:   Stress:   . Feeling of Stress :   Social Connections:   . Frequency of Communication with Friends and Family:   . Frequency of Social Gatherings with Friends and Family:   . Attends Religious Services:   . Active Member of Clubs or Organizations:   . Attends Archivist Meetings:   .  Marital Status:      Family History: Father died of brain anuerysm rupture at 39.  Mother had MI at 19.    ROS:   Please see the history of present illness.     All other systems reviewed and are negative.  EKGs/Labs/Other Studies Reviewed:    The following studies were reviewed today:   EKG:  EKG is  ordered today.  The ekg ordered today demonstrates normal sinus rhythm, rate 67, Q waves in leads III/aVF, no ST/T abnormalities  Calcium score: 1. Age advanced coronary artery calcium. Coronary calcium score is 1065 and this is at percentile 94 for patients of the same age, gender and ethnicity. 2. Several small pulmonary nodules scattered throughout the visualized lungs. Some of these pulmonary nodules are calcified. These small pulmonary nodules are indeterminate but could represent postinflammatory changes and granulomas.  No follow-up needed if patient is low-risk (and has no known or suspected primary neoplasm). Non-contrast chest CT can be considered in 12 months if patient is high-risk. This recommendation follows the consensus statement: Guidelines for Management of Incidental Pulmonary Nodules Detected on CT Images: From the Fleischner Society 2017; Radiology 2017; 284:228-243. 3.  Aortic Atherosclerosis (ICD10-I70.0).  Recent Labs: No results found for requested labs within last 8760 hours.  Recent Lipid Panel No results found for: CHOL, TRIG, HDL, CHOLHDL, VLDL, LDLCALC, LDLDIRECT  Physical Exam:    VS:  BP (!) 148/82   Pulse 67   Ht 6' (1.829 m)   Wt 180 lb 12.8 oz (82 kg)   BMI 24.52 kg/m     Wt Readings from Last 3 Encounters:  01/02/20 180 lb 12.8 oz (82 kg)  01/29/15 178 lb 12.8 oz (81.1 kg)  01/27/15 179 lb (81.2 kg)     GEN:  Well nourished, well developed in no acute distress HEENT: Normal NECK: No JVD; No carotid bruits LYMPHATICS: No lymphadenopathy CARDIAC: RRR, no murmurs, rubs, gallops RESPIRATORY:  Clear to auscultation without rales, wheezing or rhonchi  ABDOMEN: Soft, non-tender, non-distended MUSCULOSKELETAL:  No edema; No deformity  SKIN: Warm and dry NEUROLOGIC:  Alert and oriented x 3 PSYCHIATRIC:  Normal affect   ASSESSMENT:    1. Coronary artery disease involving native coronary artery of native heart without angina pectoris   2. Elevated coronary artery calcium score   3. Abnormal electrocardiogram   4. Hyperlipidemia, unspecified hyperlipidemia type   5. Essential hypertension    PLAN:     CAD: Calcium score 1065 (94th percentile).  He is asymptomatic, but given significantly elevated calcium score will evaluate for ischemia with Lexiscan Myoview.  LDL 92 on atorvastatin 10 mg daily, recently increased to 40 mg for goal LDL less than 70 given CAD.  Abnormal EKG: Inferior Q waves suggestive of prior infarct, will check TTE  Hypertension: On  hydralazine 25 mg 3 times daily, Toprol-XL 100 mg daily, enalapril 20 mg daily, amlodipine 10 mg daily  Hyperlipidemia: Recently increase atorvastatin to 40 mg daily as above.  RTC in 2 months   Medication Adjustments/Labs and Tests Ordered: Current medicines are reviewed at length with the patient today.  Concerns regarding medicines are outlined above.  Orders Placed This Encounter  Procedures  . MYOCARDIAL PERFUSION IMAGING  . EKG 12-Lead  . ECHOCARDIOGRAM COMPLETE   No orders of the defined types were placed in this encounter.   Patient Instructions  Medication Instructions:  Your physician recommends that you continue on your current medications as directed. Please refer to the Current Medication  list given to you today.  Lab Work: NONE  Testing/Procedures: Your physician has requested that you have an echocardiogram. Echocardiography is a painless test that uses sound waves to create images of your heart. It provides your doctor with information about the size and shape of your heart and how well your heart's chambers and valves are working. This procedure takes approximately one hour. There are no restrictions for this procedure.  This will be done at our [facility] location:  [place] has requested that you have a lexiscan myoview. For further information please visit [email or web address]. Please follow instruction sheet, as given.   Follow-Up: At [facility], you and your health needs are our priority.  As part of our continuing mission to provide you with exceptional heart care, we have created designated Provider Care Teams.  These Care Teams include your primary Cardiologist (physician) and Advanced Practice Providers (APPs -  Physician Assistants and Nurse Practitioners) who all work together to provide you with the care you need, when you need it.  We recommend signing up for the patient portal called "MyChart".  Sign up  information is provided on this After Visit Summary.  MyChart is used to connect with patients for Virtual Visits (Telemedicine).  Patients are able to view lab/test results, encounter notes, upcoming appointments, etc.  Non-urgent messages can be sent to your provider as well.   To learn more about what you can do with MyChart, go to [email or web address].    Your next appointment:   2 month(s)  The format for your next appointment:   Either In Person or Virtual  Provider:   Oswaldo Milian, MD         Signed, Donato Heinz, MD  01/02/2020 1:21 PM    [place]

## 2020-01-08 ENCOUNTER — Encounter (HOSPITAL_COMMUNITY): Payer: 59

## 2020-01-09 ENCOUNTER — Ambulatory Visit (HOSPITAL_COMMUNITY): Payer: 59 | Attending: Cardiovascular Disease

## 2020-01-09 ENCOUNTER — Other Ambulatory Visit: Payer: Self-pay

## 2020-01-09 DIAGNOSIS — R9431 Abnormal electrocardiogram [ECG] [EKG]: Secondary | ICD-10-CM | POA: Insufficient documentation

## 2020-01-09 DIAGNOSIS — R931 Abnormal findings on diagnostic imaging of heart and coronary circulation: Secondary | ICD-10-CM | POA: Diagnosis present

## 2020-01-10 ENCOUNTER — Telehealth (HOSPITAL_COMMUNITY): Payer: Self-pay

## 2020-01-10 NOTE — Telephone Encounter (Signed)
Encounter complete. 

## 2020-01-15 ENCOUNTER — Other Ambulatory Visit: Payer: Self-pay

## 2020-01-15 ENCOUNTER — Ambulatory Visit (HOSPITAL_COMMUNITY)
Admission: RE | Admit: 2020-01-15 | Discharge: 2020-01-15 | Disposition: A | Discharge status: Home / Self Care | Payer: 59 | Source: Ambulatory Visit | Attending: Cardiology | Admitting: Cardiology

## 2020-01-15 DIAGNOSIS — R9431 Abnormal electrocardiogram [ECG] [EKG]: Secondary | ICD-10-CM | POA: Insufficient documentation

## 2020-01-15 DIAGNOSIS — R931 Abnormal findings on diagnostic imaging of heart and coronary circulation: Secondary | ICD-10-CM | POA: Insufficient documentation

## 2020-01-15 LAB — MYOCARDIAL PERFUSION IMAGING
LV dias vol: 143 mL (ref 62–150)
LV sys vol: 78 mL
Peak HR: 88 {beats}/min
Rest HR: 54 {beats}/min
SDS: 8
SRS: 0
SSS: 8
TID: 1.04

## 2020-01-15 MED ORDER — REGADENOSON 0.4 MG/5ML IV SOLN
0.4000 mg | Freq: Once | INTRAVENOUS | Status: AC
  Administered 2020-01-15: 0.4 mg via INTRAVENOUS

## 2020-01-15 MED ORDER — TECHNETIUM TC 99M TETROFOSMIN IV KIT
31.0000 | PACK | Freq: Once | INTRAVENOUS | Status: AC | PRN
  Administered 2020-01-15: 31 via INTRAVENOUS
  Filled 2020-01-15: qty 31

## 2020-01-15 MED ORDER — TECHNETIUM TC 99M TETROFOSMIN IV KIT
10.8000 | PACK | Freq: Once | INTRAVENOUS | Status: AC | PRN
  Administered 2020-01-15: 10.8 via INTRAVENOUS
  Filled 2020-01-15: qty 11

## 2020-03-02 NOTE — Progress Notes (Signed)
Virtual Visit via Video Note   This visit type was conducted due to national recommendations for restrictions regarding the COVID-19 Pandemic (e.g. social distancing) in an effort to limit this patient's exposure and mitigate transmission in our community.  Due to his co-morbid illnesses, this patient is at least at moderate risk for complications without adequate follow up.  This format is felt to be most appropriate for this patient at this time.  All issues noted in this document were discussed and addressed.  A limited physical exam was performed with this format.  Please refer to the patient's chart for his consent to telehealth for [facility].   Date:  03/03/2020   ID:  Shane Sampson, DOB May 22, 1956, MRN [id number]  Patient Location: Home Provider Location: Office  PCP:  Crist Infante, MD  Cardiologist:  No primary care provider on file.  Electrophysiologist:  None   Evaluation Performed:  Follow-Up Visit  Chief Complaint:  CAD  History of Present Illness:    Shane Sampson is a 64 y.o. male with a hx of hypertension, hyperlipidemia, CKD who presents for follow-up. He was referred by Dr. Joylene Draft for evaluation of coronary artery disease, initially seen on 11-Jan-2020.  He underwent a calcium score in 12/29/2019, which showed calcium score 1065 (94th percentile).  His atorvastatin was increased from 10 mg to 40 mg and he was referred to cardiology for further evaluation.  He reports that he is active, golfing 18 holes every Saturday and 01/11/2023, in addition to golfing 6-7 holes several times during the week.  He walks the golf course and carries his bag, denies any chest pain or dyspnea.  Smoked 1 pack/day for 6 years, quit in 01-11-79.  Father died of brain anuerysm rupture at 60.  Mother had MI at 48.    Lexiscan Myoview on 01/15/2020 showed normal perfusion, EF 46%. TTE 01/09/2020 showed LVEF 55 to 60%, mild hypertrophy septal hypertrophy, grade 2 diastolic dysfunction, normal RV function, no  significant valvular disease.  Since last clinic visit, he reports that he has been doing well.  He denies any chest pain or dyspnea.  Continues to golf 18 holes Saturday and 01-11-23, walks and carries his bag (walked 11.5 miles this weekend).  Also golfs 6-10 holes after work most days.  He denies any lightheadedness, syncope, or palpitations.  Reports BP has been 120s to 130s over 80s.   Past Medical History:  Diagnosis Date  . Hypertension    Past Surgical History:  Procedure Laterality Date  . HERNIA REPAIR       Current Meds  Medication Sig  . amLODipine (NORVASC) 10 MG tablet Take 10 mg by mouth daily.  Marland Kitchen aspirin 81 MG tablet Take 81 mg by mouth daily.  Marland Kitchen atorvastatin (LIPITOR) 40 MG tablet Take 40 mg by mouth daily.  . enalapril (VASOTEC) 20 MG tablet Take 20 mg by mouth daily.  . hydrALAZINE (APRESOLINE) 25 MG tablet Take 25 mg by mouth 3 (three) times daily.  . metoprolol (LOPRESSOR) 100 MG tablet Take 100 mg by mouth daily.     Allergies:   Patient has no known allergies.   Social History   Tobacco Use  . Smoking status: Never Sampson  . Smokeless tobacco: Never Used  Substance Use Topics  . Alcohol use: Yes    Alcohol/week: 1.0 standard drinks    Types: 1 Standard drinks or equivalent per week  . Drug use: No     Family Hx: The patient's family  history is not on file.  ROS:   Please see the history of present illness.     All other systems reviewed and are negative.   Prior CV studies:   The following studies were reviewed today:    Labs/Other Tests and Data Reviewed:    EKG:  No ECG reviewed.  Recent Labs: No results found for requested labs within last 8760 hours.   Recent Lipid Panel No results found for: CHOL, TRIG, HDL, CHOLHDL, LDLCALC, LDLDIRECT  Wt Readings from Last 3 Encounters:  03/03/20 176 lb (79.8 kg)  01/15/20 180 lb (81.6 kg)  01/02/20 180 lb 12.8 oz (82 kg)     Objective:    Vital Signs:  Ht 6' (1.829 m)   Wt 176 lb  (79.8 kg)   BMI 23.87 kg/m    VITAL SIGNS:  reviewed  Gen: in no acute distress, appears comfortable.  Able to converse comfortably  ASSESSMENT & PLAN:     CAD: Calcium score 1065 (94th percentile).  He is asymptomatic, but given significantly elevated calcium score Lexiscan Myoview was ordered, which showed normal perfusion.  LDL 92 on 10/31/19 on atorvastatin 10 mg daily, increased to 40 mg for goal LDL less than 70.  TTE showed normal LV systolic function  Hypertension: On hydralazine 25 mg 3 times daily, Toprol-XL 100 mg daily, enalapril 20 mg daily, amlodipine 10 mg daily.  Reports controlled.    CKD stage III: Follows with nephrology  Hyperlipidemia:  Continue atorvastatin 40 mg daily  RTC in 6 months   Time:   Today, I have spent 10 minutes with the patient with telehealth technology discussing the above problems.     Medication Adjustments/Labs and Tests Ordered: Current medicines are reviewed at length with the patient today.  Concerns regarding medicines are outlined above.   Tests Ordered: No orders of the defined types were placed in this encounter.   Medication Changes: No orders of the defined types were placed in this encounter.   Follow Up:  In Person in 6 month(s)  Signed, Donato Heinz, MD  03/03/2020 12:58 PM    [place]

## 2020-03-03 ENCOUNTER — Telehealth (INDEPENDENT_AMBULATORY_CARE_PROVIDER_SITE_OTHER): Payer: 59 | Admitting: Cardiology

## 2020-03-03 VITALS — Ht 72.0 in | Wt 176.0 lb

## 2020-03-03 DIAGNOSIS — I10 Essential (primary) hypertension: Secondary | ICD-10-CM

## 2020-03-03 DIAGNOSIS — E785 Hyperlipidemia, unspecified: Secondary | ICD-10-CM

## 2020-03-03 DIAGNOSIS — I2510 Atherosclerotic heart disease of native coronary artery without angina pectoris: Secondary | ICD-10-CM

## 2020-03-03 NOTE — Patient Instructions (Signed)
Medication Instructions:  Your physician recommends that you continue on your current medications as directed. Please refer to the Current Medication list given to you today.  *If you need a refill on your cardiac medications before your next appointment, please call your pharmacy*  Follow-Up: At [facility], you and your health needs are our priority.  As part of our continuing mission to provide you with exceptional heart care, we have created designated Provider Care Teams.  These Care Teams include your primary Cardiologist (physician) and Advanced Practice Providers (APPs -  Physician Assistants and Nurse Practitioners) who all work together to provide you with the care you need, when you need it.  We recommend signing up for the patient portal called "MyChart".  Sign up information is provided on this After Visit Summary.  MyChart is used to connect with patients for Virtual Visits (Telemedicine).  Patients are able to view lab/test results, encounter notes, upcoming appointments, etc.  Non-urgent messages can be sent to your provider as well.   To learn more about what you can do with MyChart, go to [email or web address].    Your next appointment:   6 month(s)  The format for your next appointment:   Either In Person or Virtual  Provider:   Oswaldo Milian, MD

## 2020-08-31 NOTE — Progress Notes (Signed)
Cardiology Office Note:    Date:  09/01/2020   ID:  Shane Sampson, DOB 10-02-1956, MRN [id number]  PCP:  Crist Infante, MD  Cardiologist:  No primary care provider on file.  Electrophysiologist:  None   Referring MD: Crist Infante, MD   No chief complaint on file.   History of Present Illness:    Shane Sampson is a 64 y.o. male with a hx of hypertension, hyperlipidemia, CKD who presents for follow-up.  He was referred by Dr. Joylene Draft for evaluation of coronary artery disease, initially seen on 01/02/2020.  He underwent a calcium score in 12/29/2019, which showed calcium score 1065 (94th percentile).  His atorvastatin was increased from 10 mg to 40 mg and he was referred to cardiology for further evaluation.  He reports that he is active, golfing 18 holes every Saturday and Sunday, in addition to golfing 6-7 holes several times during the week.  He walks the golf course and carries his bag, denies any chest pain or dyspnea.  Smoked 1 pack/day for 6 years, quit in 1979-01-08.  Father died of brain anuerysm rupture at 69.  Mother had MI at 69.    Lexiscan Myoview on 01/15/2020 showed normal perfusion, EF 46%. TTE 01/09/2020 showed LVEF 55 to 60%, mild hypertrophy septal hypertrophy, grade 2 diastolic dysfunction, normal RV function, no significant valvular disease.  Since last clinic visit, he reports that he has been doing well.  He denies any chest pain or dyspnea.  Denies any lightheadedness, syncope, lower extremity edema, or palpitations.  He continues to get regular exercise by golfing.  Always walks the course.  This summer was golfing 6 days/week.  Reports his BP has been 130s to 140s over 80s when he checks at home.   BP Readings from Last 3 Encounters:  09/01/20 (!) 145/76  01/02/20 (!) 148/82  01/29/15 122/78     Past Medical History:  Diagnosis Date  . Hypertension     Past Surgical History:  Procedure Laterality Date  . HERNIA REPAIR      Current Medications: Current Meds    Medication Sig  . amLODipine (NORVASC) 10 MG tablet Take 10 mg by mouth daily.  Marland Kitchen aspirin 81 MG tablet Take 81 mg by mouth daily.  Marland Kitchen atorvastatin (LIPITOR) 40 MG tablet Take 40 mg by mouth daily.  . enalapril (VASOTEC) 20 MG tablet Take 20 mg by mouth daily.  . hydrALAZINE (APRESOLINE) 25 MG tablet Take 25 mg by mouth 3 (three) times daily.  . [DISCONTINUED] metoprolol (LOPRESSOR) 100 MG tablet Take 100 mg by mouth daily.     Allergies:   Patient has no known allergies.   Social History   Socioeconomic History  . Marital status: Married    Spouse name: Not on file  . Number of children: Not on file  . Years of education: Not on file  . Highest education level: Not on file  Occupational History  . Not on file  Tobacco Use  . Smoking status: Never Smoker  . Smokeless tobacco: Never Used  Substance and Sexual Activity  . Alcohol use: Yes    Alcohol/week: 1.0 standard drink    Types: 1 Standard drinks or equivalent per week  . Drug use: No  . Sexual activity: Not on file  Other Topics Concern  . Not on file  Social History Narrative  . Not on file   Social Determinants of Health   Financial Resource Strain:   . Difficulty of Paying Living Expenses:  Not on file  Food Insecurity:   . Worried About Charity fundraiser in the Last Year: Not on file  . Ran Out of Food in the Last Year: Not on file  Transportation Needs:   . Lack of Transportation (Medical): Not on file  . Lack of Transportation (Non-Medical): Not on file  Physical Activity:   . Days of Exercise per Week: Not on file  . Minutes of Exercise per Session: Not on file  Stress:   . Feeling of Stress : Not on file  Social Connections:   . Frequency of Communication with Friends and Family: Not on file  . Frequency of Social Gatherings with Friends and Family: Not on file  . Attends Religious Services: Not on file  . Active Member of Clubs or Organizations: Not on file  . Attends Archivist Meetings:  Not on file  . Marital Status: Not on file     Family History: Father died of brain anuerysm rupture at 16.  Mother had MI at 74.    ROS:   Please see the history of present illness.     All other systems reviewed and are negative.  EKGs/Labs/Other Studies Reviewed:    The following studies were reviewed today:   EKG:  EKG is  ordered today.  The ekg ordered today demonstrates normal sinus rhythm, rate 64,  no ST/T abnormalities  Calcium score: 1. Age advanced coronary artery calcium. Coronary calcium score is 1065 and this is at percentile 94 for patients of the same age, gender and ethnicity. 2. Several small pulmonary nodules scattered throughout the visualized lungs. Some of these pulmonary nodules are calcified. These small pulmonary nodules are indeterminate but could represent postinflammatory changes and granulomas. No follow-up needed if patient is low-risk (and has no known or suspected primary neoplasm). Non-contrast chest CT can be considered in 12 months if patient is high-risk. This recommendation follows the consensus statement: Guidelines for Management of Incidental Pulmonary Nodules Detected on CT Images: From the Fleischner Society 2017; Radiology 2017; 284:228-243. 3.  Aortic Atherosclerosis (ICD10-I70.0).  Recent Labs: No results found for requested labs within last 8760 hours.  Recent Lipid Panel No results found for: CHOL, TRIG, HDL, CHOLHDL, VLDL, LDLCALC, LDLDIRECT  Physical Exam:    VS:  BP (!) 145/76   Pulse 64   Ht 6' (1.829 m)   Wt 181 lb 9.6 oz (82.4 kg)   SpO2 98%   BMI 24.63 kg/m     Wt Readings from Last 3 Encounters:  09/01/20 181 lb 9.6 oz (82.4 kg)  03/03/20 176 lb (79.8 kg)  01/15/20 180 lb (81.6 kg)     GEN:  Well nourished, well developed in no acute distress HEENT: Normal NECK: No JVD; No carotid bruits LYMPHATICS: No lymphadenopathy CARDIAC: RRR, no murmurs, rubs, gallops RESPIRATORY:  Clear to auscultation without  rales, wheezing or rhonchi  ABDOMEN: Soft, non-tender, non-distended MUSCULOSKELETAL:  No edema; No deformity  SKIN: Warm and dry NEUROLOGIC:  Alert and oriented x 3 PSYCHIATRIC:  Normal affect   ASSESSMENT:    1. Coronary artery disease involving native coronary artery of native heart without angina pectoris   2. Essential hypertension   3. Stage 3 chronic kidney disease, unspecified whether stage 3a or 3b CKD ([place])   4. Hyperlipidemia, unspecified hyperlipidemia type    PLAN:     BLT:[id number] score 1065 (94th percentile). He is asymptomatic, butgivensignificantlyelevated calcium score Lexiscan Myoview was ordered, which showed normal perfusion. LDL 92  on 10/31/19 on atorvastatin 10 mgdaily, increased to 40 mg for goal LDL less than 70.  TTE showed normal LV systolic function  Hypertension: On hydralazine 25 mg 3 times daily, Toprol-XL 100 mg daily, enalapril 20 mg daily, amlodipine 10 mg daily.    BP elevated, will switch from Toprol-XL to carvedilol 12.5 mg twice daily.  Asked patient to monitor BP daily for next 2 weeks and call with results.  CKD stage III: Follows with nephrology  Hyperlipidemia: Continue atorvastatin 40 mg daily.  He is having lab work done with PCP next week, will follow up results of lipid panel.  Goal LDL less than 70.  RTC in 6 months  Medication Adjustments/Labs and Tests Ordered: Current medicines are reviewed at length with the patient today.  Concerns regarding medicines are outlined above.  Orders Placed This Encounter  Procedures  . EKG 12-Lead   Meds ordered this encounter  Medications  . carvedilol (COREG) 12.5 MG tablet    Sig: Take 1 tablet (12.5 mg total) by mouth 2 (two) times daily.    Dispense:  180 tablet    Refill:  3    Patient Instructions  Medication Instructions:  STOP metoprolol START carvedilol (Coreg) 12.5 mg two times daily  Please check your blood pressure at home daily, write it down.  Call the office or  send message via Mychart with the readings in 2 weeks for Dr. Gardiner Rhyme to review.   *If you need a refill on your cardiac medications before your next appointment, please call your pharmacy*   Lab Work: Call when you get your cholesterol checked with your primary Have them fax to [phone]  Follow-Up: At [facility], you and your health needs are our priority.  As part of our continuing mission to provide you with exceptional heart care, we have created designated Provider Care Teams.  These Care Teams include your primary Cardiologist (physician) and Advanced Practice Providers (APPs -  Physician Assistants and Nurse Practitioners) who all work together to provide you with the care you need, when you need it.  We recommend signing up for the patient portal called "MyChart".  Sign up information is provided on this After Visit Summary.  MyChart is used to connect with patients for Virtual Visits (Telemedicine).  Patients are able to view lab/test results, encounter notes, upcoming appointments, etc.  Non-urgent messages can be sent to your provider as well.   To learn more about what you can do with MyChart, go to [email or web address].    Your next appointment:   6 month(s)  The format for your next appointment:   In Person  Provider:   Dr. Gardiner Rhyme      Signed, Donato Heinz, MD  09/01/2020 10:19 PM    [place]

## 2020-09-01 ENCOUNTER — Other Ambulatory Visit: Payer: Self-pay

## 2020-09-01 ENCOUNTER — Ambulatory Visit (INDEPENDENT_AMBULATORY_CARE_PROVIDER_SITE_OTHER): Payer: 59 | Admitting: Cardiology

## 2020-09-01 ENCOUNTER — Encounter: Payer: Self-pay | Admitting: Cardiology

## 2020-09-01 VITALS — BP 145/76 | HR 64 | Ht 72.0 in | Wt 181.6 lb

## 2020-09-01 DIAGNOSIS — I10 Essential (primary) hypertension: Secondary | ICD-10-CM | POA: Diagnosis not present

## 2020-09-01 DIAGNOSIS — I2510 Atherosclerotic heart disease of native coronary artery without angina pectoris: Secondary | ICD-10-CM | POA: Diagnosis not present

## 2020-09-01 DIAGNOSIS — E785 Hyperlipidemia, unspecified: Secondary | ICD-10-CM | POA: Diagnosis not present

## 2020-09-01 DIAGNOSIS — N183 Chronic kidney disease, stage 3 unspecified: Secondary | ICD-10-CM

## 2020-09-01 MED ORDER — CARVEDILOL 12.5 MG PO TABS
12.5000 mg | ORAL_TABLET | Freq: Two times a day (BID) | ORAL | 3 refills | Status: DC
Start: 2020-09-01 — End: 2020-09-29

## 2020-09-01 NOTE — Patient Instructions (Signed)
Medication Instructions:  STOP metoprolol START carvedilol (Coreg) 12.5 mg two times daily  Please check your blood pressure at home daily, write it down.  Call the office or send message via Mychart with the readings in 2 weeks for Dr. Gardiner Rhyme to review.   *If you need a refill on your cardiac medications before your next appointment, please call your pharmacy*   Lab Work: Call when you get your cholesterol checked with your primary Have them fax to [phone]  Follow-Up: At [facility], you and your health needs are our priority.  As part of our continuing mission to provide you with exceptional heart care, we have created designated Provider Care Teams.  These Care Teams include your primary Cardiologist (physician) and Advanced Practice Providers (APPs -  Physician Assistants and Nurse Practitioners) who all work together to provide you with the care you need, when you need it.  We recommend signing up for the patient portal called "MyChart".  Sign up information is provided on this After Visit Summary.  MyChart is used to connect with patients for Virtual Visits (Telemedicine).  Patients are able to view lab/test results, encounter notes, upcoming appointments, etc.  Non-urgent messages can be sent to your provider as well.   To learn more about what you can do with MyChart, go to [email or web address].    Your next appointment:   6 month(s)  The format for your next appointment:   In Person  Provider:   Dr. Gardiner Rhyme

## 2020-09-29 NOTE — Telephone Encounter (Signed)
BP is looking better, no changes at this time

## 2021-05-18 ENCOUNTER — Ambulatory Visit: Payer: Self-pay

## 2021-05-18 ENCOUNTER — Other Ambulatory Visit: Payer: Self-pay

## 2021-05-18 ENCOUNTER — Encounter: Payer: Self-pay | Admitting: Orthopaedic Surgery

## 2021-05-18 ENCOUNTER — Ambulatory Visit (INDEPENDENT_AMBULATORY_CARE_PROVIDER_SITE_OTHER): Payer: 59 | Admitting: Orthopaedic Surgery

## 2021-05-18 VITALS — BP 133/80 | HR 99 | Ht 72.0 in | Wt 178.0 lb

## 2021-05-18 DIAGNOSIS — M7121 Synovial cyst of popliteal space [Baker], right knee: Secondary | ICD-10-CM

## 2021-05-18 DIAGNOSIS — M25561 Pain in right knee: Secondary | ICD-10-CM

## 2021-05-18 NOTE — Progress Notes (Addendum)
Office Visit Note   Patient: Shane Sampson           Date of Birth: [birth year]           MRN: [id number] Visit Date: 05/18/2021              Requested by: Crist Infante, MD [address] [place],  [place] [address] PCP: Crist Infante, MD   Assessment & Plan: Visit Diagnoses:  1. Acute pain of right knee   2. Baker's cyst of knee, right     Plan: Patient has Baker's cyst decreased in size last 48 hours.  He is ambulatory not really having as much symptoms.  If it enlarges he can return.  Follow-Up Instructions: No follow-ups on file.   Orders:  Orders Placed This Encounter  Procedures   XR Knee 1-2 Views Right   No orders of the defined types were placed in this encounter.     Procedures: No procedures performed   Clinical Data: No additional findings.   Subjective: Chief Complaint  Patient presents with   Right Knee - Pain    HPI 65 year old male had onset of significant popliteal swelling on Thursday and Friday of last week with tightness with flexion.  No specific injury.  He does like to walk and play golf but does not recall any twisting injury.  Swelling in the popliteal region was moderate to large.  He had come by my house it was noticed that it was tight and he came into the office today for imaging studies and repeat exam.  Review of Systems patient has fairly significant hypertension on several medications also high cholesterol.  He has had a cardiac stress test which was normal.  He did have high cardiac calcium score.  Negative for chest pain negative for MI.   Objective: Vital Signs: BP 133/80   Pulse 99   Ht 6' (1.829 m)   Wt 178 lb (80.7 kg)   BMI 24.14 kg/m   Physical Exam Constitutional:      Appearance: He is well-developed.  HENT:     Head: Normocephalic and atraumatic.     Right Ear: External ear normal.     Left Ear: External ear normal.  Eyes:     Pupils: Pupils are equal, round, and reactive to light.  Neck:     Thyroid: No  thyromegaly.     Trachea: No tracheal deviation.  Cardiovascular:     Rate and Rhythm: Normal rate.  Pulmonary:     Effort: Pulmonary effort is normal.     Breath sounds: No wheezing.  Abdominal:     General: Bowel sounds are normal.     Palpations: Abdomen is soft.  Musculoskeletal:     Cervical back: Neck supple.  Skin:    General: Skin is warm and dry.     Capillary Refill: Capillary refill takes less than 2 seconds.  Neurological:     Mental Status: He is alert and oriented to person, place, and time.  Psychiatric:        Behavior: Behavior normal.        Thought Content: Thought content normal.        Judgment: Judgment normal.    Ortho Exam patient has tenderness and less popliteal swelling then 48 hours ago.  Previously posterior region was tight.  He does have a Baker's cyst that is small to medium size.  No tenderness down in the calf.  Gastrocs normal.  He is able to  heel and toe walk negative straight leg raising 90 degrees.  Negative Lachman.  No medial or lateral joint line tenderness.  Specialty Comments:  No specialty comments available.  Imaging: No results found.   PMFS History: Patient Active Problem List   Diagnosis Date Noted   Baker's cyst of knee, right 05/19/2021   Past Medical History:  Diagnosis Date   Hypertension     No family history on file.  Past Surgical History:  Procedure Laterality Date   HERNIA REPAIR     Social History   Occupational History   Not on file  Tobacco Use   Smoking status: Never   Smokeless tobacco: Never  Substance and Sexual Activity   Alcohol use: Yes    Alcohol/week: 1.0 standard drink    Types: 1 Standard drinks or equivalent per week   Drug use: No   Sexual activity: Not on file

## 2021-05-19 DIAGNOSIS — M7121 Synovial cyst of popliteal space [Baker], right knee: Secondary | ICD-10-CM | POA: Insufficient documentation

## 2021-06-02 ENCOUNTER — Encounter: Payer: Self-pay | Admitting: Orthopaedic Surgery

## 2021-06-02 DIAGNOSIS — M25561 Pain in right knee: Secondary | ICD-10-CM

## 2021-06-02 DIAGNOSIS — M7121 Synovial cyst of popliteal space [Baker], right knee: Secondary | ICD-10-CM

## 2021-06-18 ENCOUNTER — Ambulatory Visit
Admission: RE | Admit: 2021-06-18 | Discharge: 2021-06-18 | Disposition: A | Discharge status: Home / Self Care | Payer: 59 | Source: Ambulatory Visit | Attending: Orthopaedic Surgery | Admitting: Orthopaedic Surgery

## 2021-06-18 ENCOUNTER — Other Ambulatory Visit: Payer: Self-pay

## 2021-06-18 DIAGNOSIS — M25561 Pain in right knee: Secondary | ICD-10-CM

## 2021-06-18 DIAGNOSIS — M7121 Synovial cyst of popliteal space [Baker], right knee: Secondary | ICD-10-CM

## 2021-07-01 ENCOUNTER — Encounter: Payer: Self-pay | Admitting: Orthopaedic Surgery

## 2021-07-01 NOTE — Telephone Encounter (Signed)
Please advise. Patient has had MRI.

## 2021-07-07 ENCOUNTER — Encounter: Payer: Self-pay | Admitting: Orthopaedic Surgery

## 2021-07-07 ENCOUNTER — Other Ambulatory Visit: Payer: Self-pay

## 2021-07-07 ENCOUNTER — Ambulatory Visit (INDEPENDENT_AMBULATORY_CARE_PROVIDER_SITE_OTHER): Payer: 59 | Admitting: Orthopaedic Surgery

## 2021-07-07 VITALS — BP 116/73 | HR 89 | Ht 72.0 in | Wt 178.0 lb

## 2021-07-07 DIAGNOSIS — M25561 Pain in right knee: Secondary | ICD-10-CM | POA: Diagnosis not present

## 2021-07-07 DIAGNOSIS — M7121 Synovial cyst of popliteal space [Baker], right knee: Secondary | ICD-10-CM

## 2021-07-07 MED ORDER — LIDOCAINE HCL 1 % IJ SOLN
0.5000 mL | INTRAMUSCULAR | Status: AC | PRN
  Administered 2021-07-07: .5 mL

## 2021-07-07 MED ORDER — BUPIVACAINE HCL 0.25 % IJ SOLN
4.0000 mL | INTRAMUSCULAR | Status: AC | PRN
  Administered 2021-07-07: 4 mL via INTRA_ARTICULAR

## 2021-07-07 MED ORDER — METHYLPREDNISOLONE ACETATE 40 MG/ML IJ SUSP
40.0000 mg | INTRAMUSCULAR | Status: AC | PRN
  Administered 2021-07-07: 40 mg via INTRA_ARTICULAR

## 2021-07-07 NOTE — Progress Notes (Signed)
Office Visit Note   Patient: Shane Sampson           Date of Birth: [birth year]           MRN: [id number] Visit Date: 07/07/2021              Requested by: Crist Infante, MD [address] [place],  [place] [address] PCP: Crist Infante, MD   Assessment & Plan: Visit Diagnoses:  1. Baker's cyst of knee, right     Plan: MRI scan images and report reviewed with patient.  Knee injected with cortisone hopefully this will decrease production of fluid help with a Baker's cyst.  He has persistent problems he can return for Baker's cyst aspiration.  We discussed possible arthroscopy if he has persistent or increasing symptoms with time.  He can follow-up with me as needed.  Follow-Up Instructions: No follow-ups on file.   Orders:  No orders of the defined types were placed in this encounter.  No orders of the defined types were placed in this encounter.     Procedures: Large Joint Inj: R knee on 07/07/2021 10:54 AM Indications: pain and joint swelling Details: 22 G 1.5 in needle, anterolateral approach  Arthrogram: No  Medications: 40 mg methylPREDNISolone acetate 40 MG/ML; 0.5 mL lidocaine 1 %; 4 mL bupivacaine 0.25 % Outcome: tolerated well, no immediate complications Procedure, treatment alternatives, risks and benefits explained, specific risks discussed. Consent was given by the patient. Immediately prior to procedure a time out was called to verify the correct patient, procedure, equipment, support staff and site/side marked as required. Patient was prepped and draped in the usual sterile fashion.      Clinical Data: No additional findings.   Subjective: Chief Complaint  Patient presents with  . Right Knee - Pain    HPI 65 year old male likes to walk the golf course carry his bag returns post MRI scan which showed moderately large Baker's cyst with posterior medial meniscal tear and some chondrosis of the knee primarily patellofemoral joint.  He has some discomfort with  full extension notes some fullness in the popliteal region and sometimes pain and discomfort in that region.  Review of Systems all other systems updated noncontributory.   Objective: Vital Signs: BP 116/73   Pulse 89   Ht 6' (1.829 m)   Wt 178 lb (80.7 kg)   BMI 24.14 kg/m   Physical Exam Constitutional:      Appearance: He is well-developed.  HENT:     Head: Normocephalic and atraumatic.     Right Ear: External ear normal.     Left Ear: External ear normal.  Eyes:     Pupils: Pupils are equal, round, and reactive to light.  Neck:     Thyroid: No thyromegaly.     Trachea: No tracheal deviation.  Cardiovascular:     Rate and Rhythm: Normal rate.  Pulmonary:     Effort: Pulmonary effort is normal.     Breath sounds: No wheezing.  Abdominal:     General: Bowel sounds are normal.     Palpations: Abdomen is soft.  Musculoskeletal:     Cervical back: Neck supple.  Skin:    General: Skin is warm and dry.     Capillary Refill: Capillary refill takes less than 2 seconds.  Neurological:     Mental Status: He is alert and oriented to person, place, and time.  Psychiatric:        Behavior: Behavior normal.  Thought Content: Thought content normal.        Judgment: Judgment normal.    Ortho Exam right knee moderate Baker's cyst.  Posterior medial pain with hyperextension.  He is amatory without limp.  2+ knee effusion noted anteriorly.  Specialty Comments:  No specialty comments available.  Imaging: No results found.CLINICAL DATA:  Knee pain swelling.   EXAM: MRI OF THE RIGHT KNEE WITHOUT CONTRAST   TECHNIQUE: Multiplanar, multisequence MR imaging of the knee was performed. No intravenous contrast was administered.   COMPARISON:  Radiographs 05/18/2021   FINDINGS: MENISCI   Medial meniscus: Free edge tears involving the posterior horn mid body junction region and peripheral longitudinal tear involving the midbody.   Lateral meniscus:  Intact    LIGAMENTS   Cruciates:  Intact   Collaterals:  Intact. MCL and pes anserine bursitis.   CARTILAGE   Patellofemoral:  Moderate degenerative chondrosis.   Medial: Moderate degenerative chondrosis with early joint space narrowing and spurring.   Lateral:  Mild to moderate degenerative chondrosis.   Joint:  Moderate-sized joint effusion.   Popliteal Fossa:  Large leaking Baker's cyst measuring 7.3 x 3.4 cm.   Extensor Mechanism: The patella retinacular structures are intact and the quadriceps and patellar tendons are intact.   Bones:  No acute bony findings osteochondral lesion.   Other: Unremarkable knee musculature.   IMPRESSION: 1. Free edge tears involving the posterior horn mid body junction region of the medial meniscus along with a peripheral longitudinal tear involving the midbody region. 2. Intact ligamentous structures and no acute bony findings. 3. Moderate-sized joint effusion and large leaking Baker's cyst. 4. Tricompartmental degenerative degenerative changes.     Electronically Signed   By: Marijo Sanes M.D.   On: 06/21/2021 10:41   PMFS History: Patient Active Problem List   Diagnosis Date Noted  . Baker's cyst of knee, right 05/19/2021   Past Medical History:  Diagnosis Date  . Hypertension     No family history on file.  Past Surgical History:  Procedure Laterality Date  . HERNIA REPAIR     Social History   Occupational History  . Not on file  Tobacco Use  . Smoking status: Never  . Smokeless tobacco: Never  Substance and Sexual Activity  . Alcohol use: Yes    Alcohol/week: 1.0 standard drink    Types: 1 Standard drinks or equivalent per week  . Drug use: No  . Sexual activity: Not on file

## 2021-09-23 ENCOUNTER — Other Ambulatory Visit: Payer: Self-pay | Admitting: Cardiology

## 2021-09-25 ENCOUNTER — Other Ambulatory Visit: Payer: Self-pay | Admitting: Cardiology

## 2021-09-25 ENCOUNTER — Telehealth: Payer: Self-pay | Admitting: Cardiology

## 2021-09-25 NOTE — Telephone Encounter (Signed)
This is Dr. Schumann's pt 

## 2021-09-25 NOTE — Telephone Encounter (Signed)
Returned call to pt. No answer. Unable to leave msg to call back.

## 2021-09-25 NOTE — Telephone Encounter (Signed)
Pt c/o medication issue:  1. Name of Medication: carvedillol  2. How are you currently taking this medication (dosage and times per day)?    3. Are you having a reaction (difficulty breathing--STAT)? no  4. What is your medication issue? Patient was calling for refill on this medication but is not on his current list of medication. Patient is out of medication Please advise

## 2021-09-27 NOTE — Telephone Encounter (Signed)
Can give short-term prescription, but needs seen prior to further refills

## 2021-10-02 NOTE — Telephone Encounter (Signed)
Attempted to call patient, unable to reach. Will try again at another time.

## 2021-10-28 NOTE — Telephone Encounter (Signed)
Attempted to call patient to get him scheduled for a follow up, unable to reach or leave message at this time. Will try again at another time.

## 2021-11-16 NOTE — Telephone Encounter (Signed)
Attempted to call patient, unable to reach or leave message at this time. Patient will need follow up appointment for future refills. Attempted to call patient x3. Will close this encounter.

## 2022-03-17 IMAGING — CT CT CARDIAC CORONARY ARTERY CALCIUM SCORE
1 series · 6 of 20 positions shown, 8 images · non-contrast
Comparison: None.

CLINICAL DATA: 63-year-old white male with hyperlipidemia.

EXAM:
CT CARDIAC CORONARY ARTERY CALCIUM SCORE
TECHNIQUE: Non-contrast imaging through the heart was performed using
prospective ECG gating. Image post processing was performed on an
independent workstation, allowing for quantitative analysis of the
heart and coronary arteries. Note that this exam targets the heart
and the chest was not imaged in its entirety.

[Series 7: calcium scoring 2.00 br40 bestdiast 69% sag · sagittal · 0.27mm/px · 6 of 193 slices shown, 8 images]
[im 21/193  vessel]
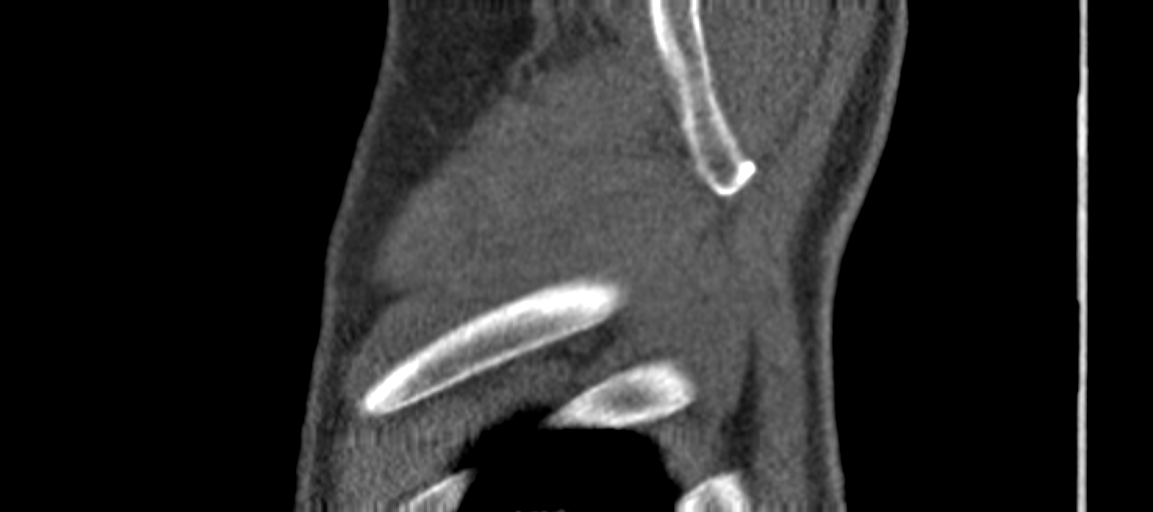
[im 21/193  lung]
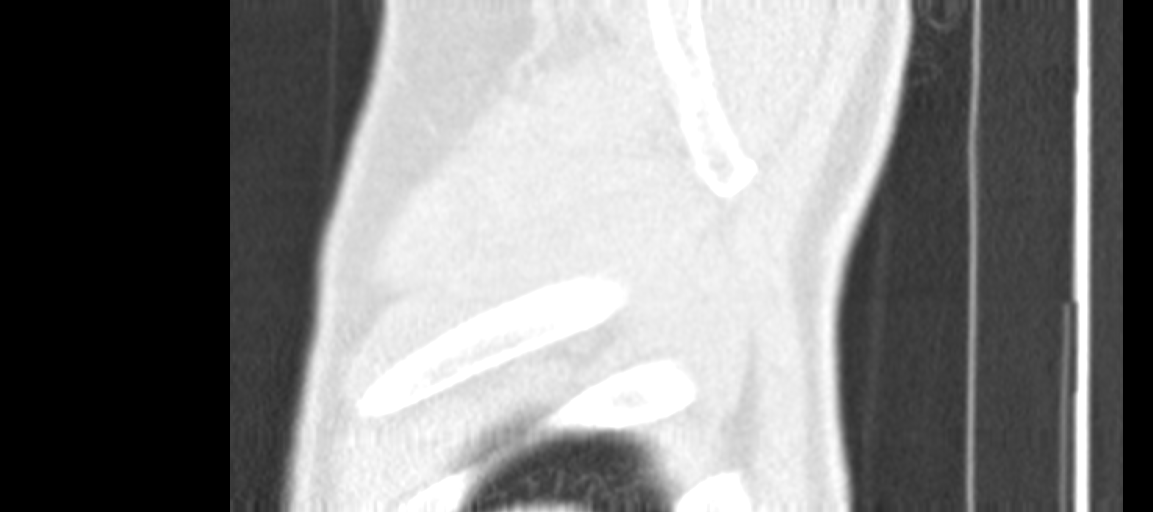
[im 51/193  vessel]
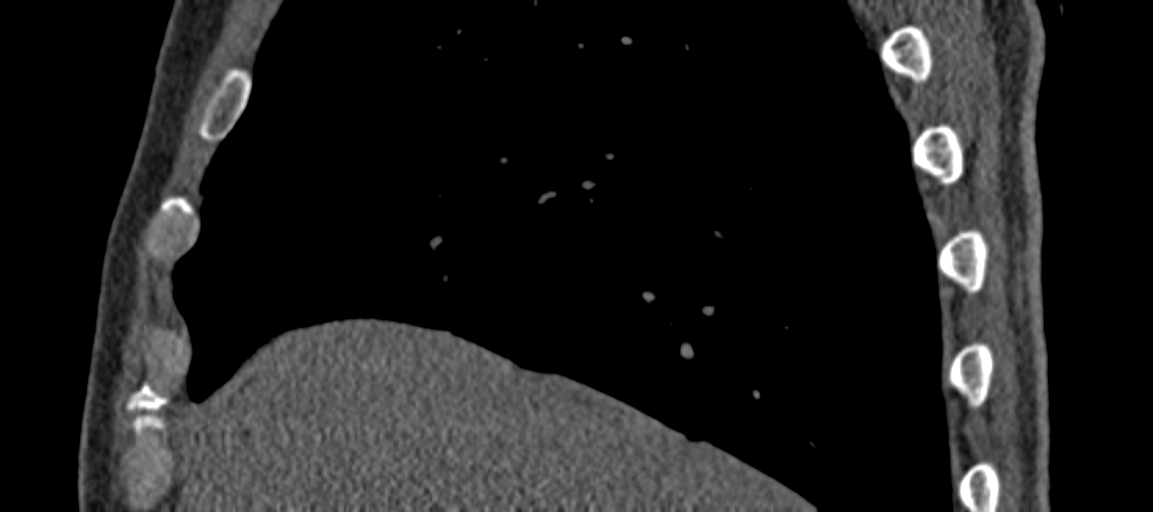
[im 81/193  vessel]
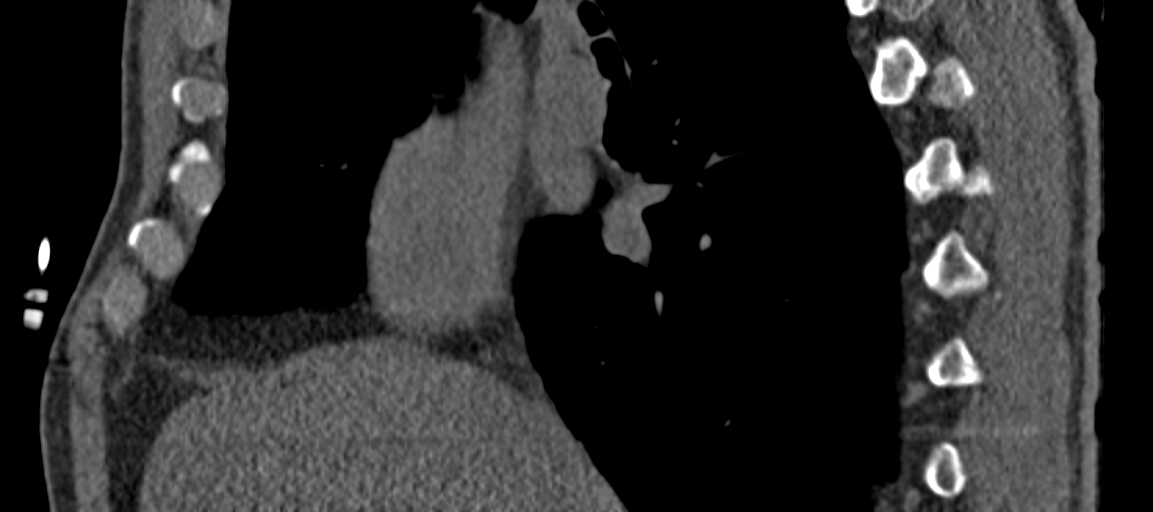
[im 112/193  vessel]
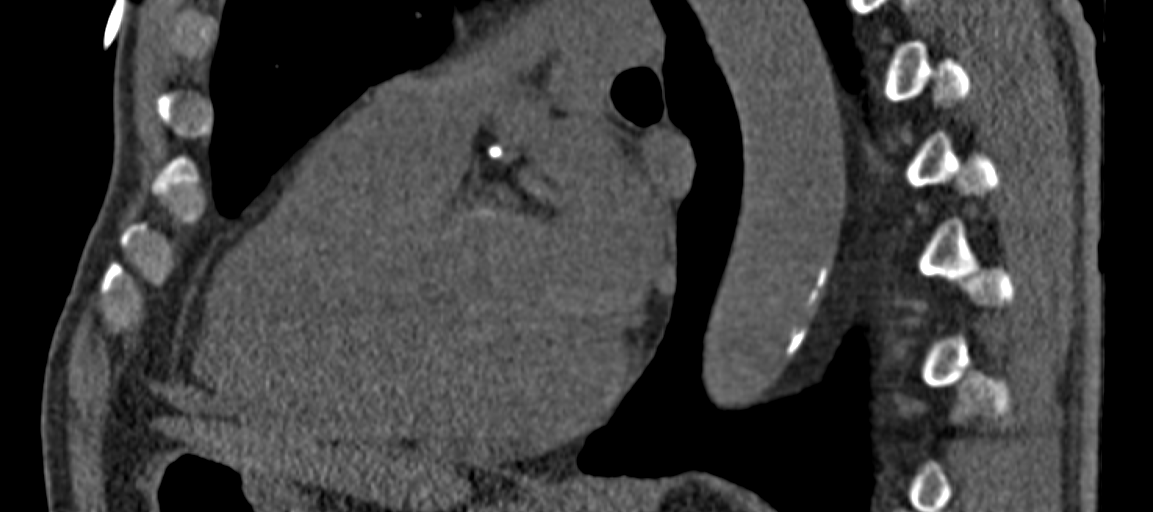
[im 142/193  vessel]
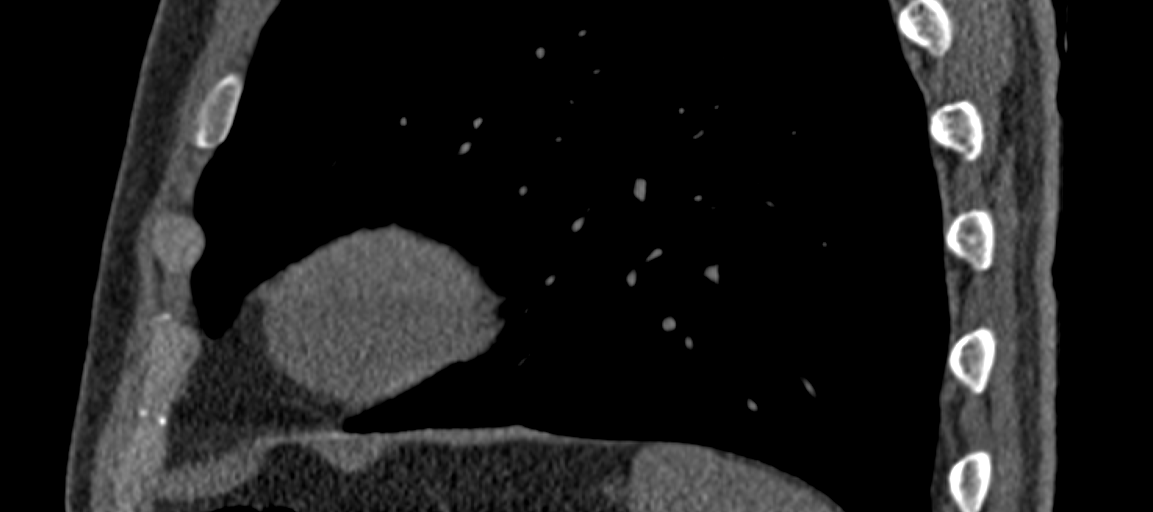
[im 142/193  lung]
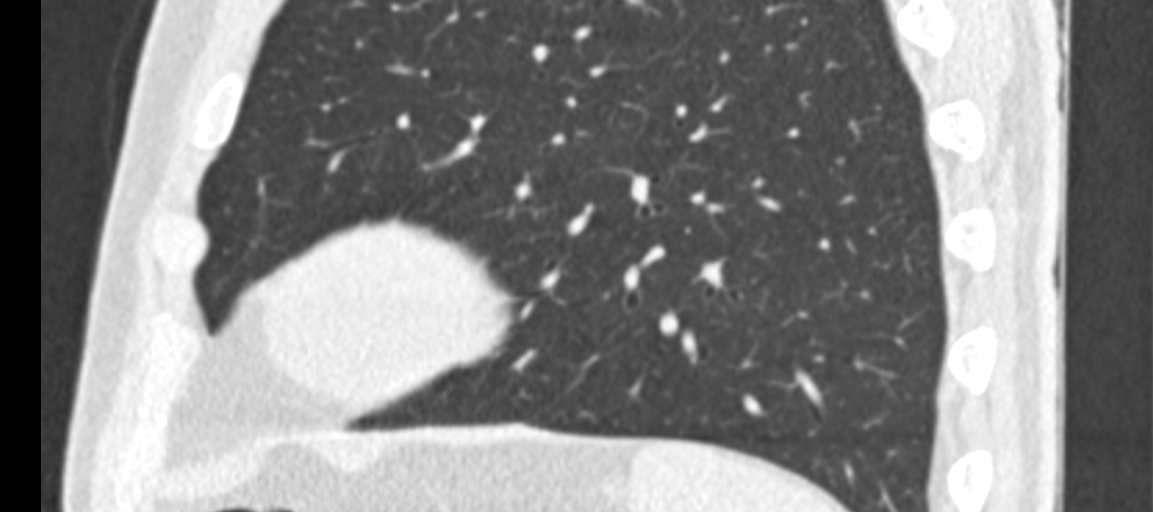
[im 172/193  vessel]
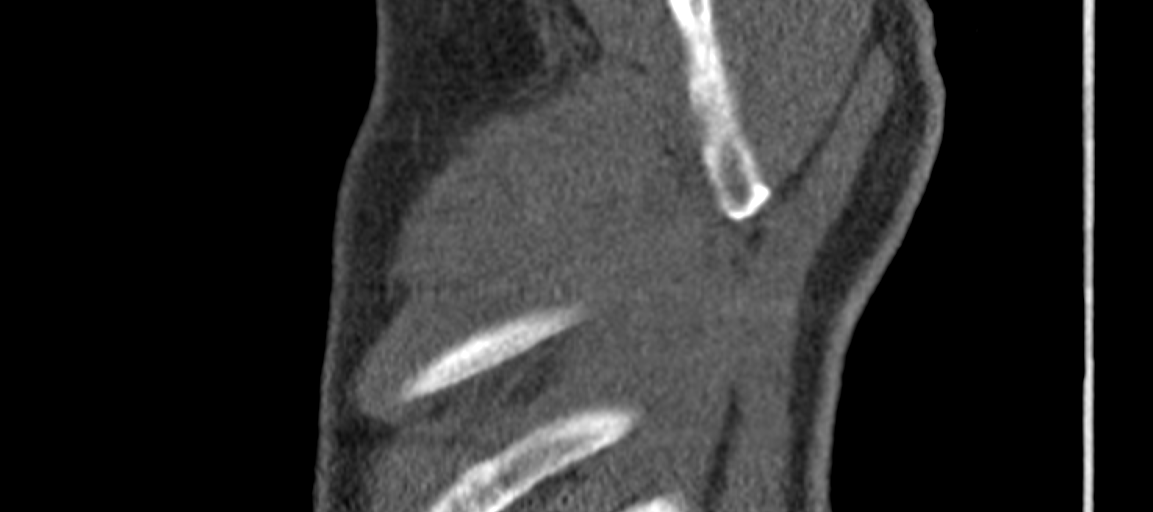

[6 of 20 positions shown; findings below may reference images not displayed]

FINDINGS: CORONARY CALCIUM SCORES:

Left Main:

LAD: 401

LCx: 309

RCA: 307

Total Agatston Score: 1104

[HOSPITAL] percentile: 94

AORTA MEASUREMENTS:

Ascending Aorta: 35 mm

Descending Aorta: 28 mm

OTHER FINDINGS:

Small amount of calcium at the aortic arch and descending thoracic
aorta. Normal caliber of the thoracic aorta. Large amount of
coronary artery calcium. No significant lymph node enlargement in
the visualized mediastinal or hilar regions. Heart size is normal.
No significant pericardial fluid. Images of the upper abdomen are
unremarkable. No large pleural effusions. 5 mm nodule in the right
lower lobe on sequence 9, image 63. Partially calcified nodule in
the right middle lobe on sequence 9 image, image 37 measures 5 mm. 4
mm nodule in the right upper lobe on sequence 9, image 6. Subtle
nodule in the right middle lobe may be associated with a small
fissure on sequence 9, image 30. 2 mm nodule in the lingula on
sequence 9, image 48. No acute bone abnormality.
IMPRESSION: 1. Age advanced coronary artery calcium. Coronary calcium score is
1104 and this is at percentile 94 for patients of the same age,
gender and ethnicity.
2. Several small pulmonary nodules scattered throughout the
visualized lungs. Some of these pulmonary nodules are calcified.
These small pulmonary nodules are indeterminate but could represent
postinflammatory changes and granulomas. No follow-up needed if
patient is low-risk (and has no known or suspected primary
neoplasm). Non-contrast chest CT can be considered in 12 months if
patient is high-risk. This recommendation follows the consensus
statement: Guidelines for Management of Incidental Pulmonary Nodules
Detected on CT Images: From the [HOSPITAL] 5905; Radiology
3.  Aortic Atherosclerosis ([id number]).

## 2022-04-11 NOTE — Progress Notes (Unsigned)
Cardiology Office Note:    Date:  04/14/2022   ID:  Shane Sampson, DOB 09/18/1956, MRN [id number]  PCP:  Crist Infante, MD  Cardiologist:  None  Electrophysiologist:  None   Referring MD: Crist Infante, MD   Chief Complaint  Patient presents with   Coronary Artery Disease    History of Present Illness:    Shane Sampson is a 66 y.o. male with a hx of hypertension, hyperlipidemia, CKD who presents for follow-up.  He was referred by Dr. Joylene Draft for evaluation of coronary artery disease, initially seen on 01/02/2020.  He underwent a calcium score in 12/29/2019, which showed calcium score 1065 (94th percentile).  His atorvastatin was increased from 10 mg to 40 mg and he was referred to cardiology for further evaluation.  Smoked 1 pack/day for 6 years, quit in 01-12-79.  Father died of brain anuerysm rupture at 49.  Mother had MI at 41.    Lexiscan Myoview on 01/15/2020 showed normal perfusion, EF 46%. TTE 01/09/2020 showed LVEF 55 to 60%, mild hypertrophy septal hypertrophy, grade 2 diastolic dysfunction, normal RV function, no significant valvular disease.  Since last clinic visit, he reports that he has been doing well.  Denies any chest pain, dyspnea, lightheadedness, syncope, lower extremity edema, or palpitations.  He continues to walk regularly, walks 18 holes of golf on Saturday and again on Sunday and then 4-6 holes each evening during the week.  Denies any exertional symptoms.  Reports BP 120s to 130s over 70s to 80s when he checks at home.   BP Readings from Last 3 Encounters:  04/14/22 (!) 160/90  07/07/21 116/73  05/18/21 133/80     Past Medical History:  Diagnosis Date   Hypertension     Past Surgical History:  Procedure Laterality Date   HERNIA REPAIR      Current Medications: Current Meds  Medication Sig   amLODipine (NORVASC) 10 MG tablet Take 10 mg by mouth daily.   aspirin 81 MG tablet Take 81 mg by mouth daily.   atorvastatin (LIPITOR) 40 MG tablet Take 40 mg by mouth  daily.   enalapril (VASOTEC) 20 MG tablet Take 20 mg by mouth daily.   ezetimibe (ZETIA) 10 MG tablet    hydrALAZINE (APRESOLINE) 50 MG tablet Take 1 tablet twice a day by oral route.   tamsulosin (FLOMAX) 0.4 MG CAPS capsule Take 1 capsule every day by oral route.   [DISCONTINUED] hydrALAZINE (APRESOLINE) 25 MG tablet Take 25 mg by mouth 3 (three) times daily.     Allergies:   Patient has no known allergies.   Social History   Socioeconomic History   Marital status: Married    Spouse name: Not on file   Number of children: Not on file   Years of education: Not on file   Highest education level: Not on file  Occupational History   Not on file  Tobacco Use   Smoking status: Never   Smokeless tobacco: Never  Substance and Sexual Activity   Alcohol use: Yes    Alcohol/week: 1.0 standard drink of alcohol    Types: 1 Standard drinks or equivalent per week   Drug use: No   Sexual activity: Not on file  Other Topics Concern   Not on file  Social History Narrative   Not on file   Social Determinants of Health   Financial Resource Strain: Not on file  Food Insecurity: Not on file  Transportation Needs: Not on file  Physical Activity:  Not on file  Stress: Not on file  Social Connections: Not on file     Family History: Father died of brain anuerysm rupture at 61.  Mother had MI at 67.    ROS:   Please see the history of present illness.     All other systems reviewed and are negative.  EKGs/Labs/Other Studies Reviewed:    The following studies were reviewed today:   EKG:   04/14/2022: Normal sinus rhythm, rate 72, no ST abnormality  Calcium score: 1. Age advanced coronary artery calcium. Coronary calcium score is 1065 and this is at percentile 94 for patients of the same age, gender and ethnicity. 2. Several small pulmonary nodules scattered throughout the visualized lungs. Some of these pulmonary nodules are calcified. These small pulmonary nodules are  indeterminate but could represent postinflammatory changes and granulomas. No follow-up needed if patient is low-risk (and has no known or suspected primary neoplasm). Non-contrast chest CT can be considered in 12 months if patient is high-risk. This recommendation follows the consensus statement: Guidelines for Management of Incidental Pulmonary Nodules Detected on CT Images: From the Fleischner Society 2017; Radiology 2017; 284:228-243. 3.  Aortic Atherosclerosis (ICD10-I70.0).  Recent Labs: No results found for requested labs within last 365 days.  Recent Lipid Panel No results found for: "CHOL", "TRIG", "HDL", "CHOLHDL", "VLDL", "LDLCALC", "LDLDIRECT"  Physical Exam:    VS:  BP (!) 160/90   Pulse 72   Ht 6' (1.829 m)   Wt 179 lb (81.2 kg)   SpO2 96%   BMI 24.28 kg/m     Wt Readings from Last 3 Encounters:  04/14/22 179 lb (81.2 kg)  07/07/21 178 lb (80.7 kg)  05/18/21 178 lb (80.7 kg)     GEN:  Well nourished, well developed in no acute distress HEENT: Normal NECK: No JVD; No carotid bruits LYMPHATICS: No lymphadenopathy CARDIAC: RRR, no murmurs, rubs, gallops RESPIRATORY:  Clear to auscultation without rales, wheezing or rhonchi  ABDOMEN: Soft, non-tender, non-distended MUSCULOSKELETAL:  No edema; No deformity  SKIN: Warm and dry NEUROLOGIC:  Alert and oriented x 3 PSYCHIATRIC:  Normal affect   ASSESSMENT:    1. Coronary artery disease involving native coronary artery of native heart without angina pectoris   2. Essential hypertension   3. Stage 3 chronic kidney disease, unspecified whether stage 3a or 3b CKD ([place])   4. Hyperlipidemia, unspecified hyperlipidemia type     PLAN:     CAD: Calcium score 1065 (94th percentile).  He is asymptomatic, but given significantly elevated calcium score Lexiscan Myoview was ordered 01/15/20, which showed normal perfusion.  TTE 01/09/2020 showed normal LV systolic function -Continue ASA, statin   Hypertension: On  hydralazine 50 mg 2 times daily, enalapril 20 mg daily, amlodipine 10 mg daily, Coreg 12.5 mg twice daily.  BP elevated in clinic today but reports good control at home.  Asked to check BP twice daily for next week and call with results   CKD stage III: Follows with nephrology.  Cr 1.5 on 10/29/21.  Hyperlipidemia:  Continue atorvastatin 40 mg daily and zetia 10 mg daily.  LDL 50 11/13/20.  He reports had lipid panel checked in March, will obtain records    RTC in 1 year  Medication Adjustments/Labs and Tests Ordered: Current medicines are reviewed at length with the patient today.  Concerns regarding medicines are outlined above.  Orders Placed This Encounter  Procedures   EKG 12-Lead   No orders of the defined types were placed  in this encounter.   Patient Instructions  Medication Instructions:  Your physician recommends that you continue on your current medications as directed. Please refer to the Current Medication list given to you today. Please take your blood pressure twice daily for 1 week and send in a MyChart message. Please include heart rates.   HOW TO TAKE YOUR BLOOD PRESSURE: Rest 5 minutes before taking your blood pressure. Don't smoke or drink caffeinated beverages for at least 30 minutes before. Take your blood pressure before (not after) you eat. Sit comfortably with your back supported and both feet on the floor (don't cross your legs). Elevate your arm to heart level on a table or a desk. Use the proper sized cuff. It should fit smoothly and snugly around your bare upper arm. There should be enough room to slip a fingertip under the cuff. The bottom edge of the cuff should be 1 inch above the crease of the elbow. Ideally, take 3 measurements at one sitting and record the average.  *If you need a refill on your cardiac medications before your next appointment, please call your pharmacy*   Follow-Up: At [facility], you and your health needs are our priority.  As  part of our continuing mission to provide you with exceptional heart care, we have created designated Provider Care Teams.  These Care Teams include your primary Cardiologist (physician) and Advanced Practice Providers (APPs -  Physician Assistants and Nurse Practitioners) who all work together to provide you with the care you need, when you need it.  We recommend signing up for the patient portal called "MyChart".  Sign up information is provided on this After Visit Summary.  MyChart is used to connect with patients for Virtual Visits (Telemedicine).  Patients are able to view lab/test results, encounter notes, upcoming appointments, etc.  Non-urgent messages can be sent to your provider as well.   To learn more about what you can do with MyChart, go to [email or web address].    Your next appointment:   1 year(s)  The format for your next appointment:   In Person  Provider:   Oswaldo Milian, MD    Other Instructions   Important Information About Sugar         Signed, Donato Heinz, MD  04/14/2022 4:35 PM    [place]

## 2022-04-14 ENCOUNTER — Encounter: Payer: Self-pay | Admitting: Cardiology

## 2022-04-14 ENCOUNTER — Ambulatory Visit (INDEPENDENT_AMBULATORY_CARE_PROVIDER_SITE_OTHER): Payer: 59 | Admitting: Cardiology

## 2022-04-14 VITALS — BP 160/90 | HR 72 | Ht 72.0 in | Wt 179.0 lb

## 2022-04-14 DIAGNOSIS — I10 Essential (primary) hypertension: Secondary | ICD-10-CM | POA: Diagnosis not present

## 2022-04-14 DIAGNOSIS — E785 Hyperlipidemia, unspecified: Secondary | ICD-10-CM | POA: Diagnosis not present

## 2022-04-14 DIAGNOSIS — I2510 Atherosclerotic heart disease of native coronary artery without angina pectoris: Secondary | ICD-10-CM

## 2022-04-14 DIAGNOSIS — N183 Chronic kidney disease, stage 3 unspecified: Secondary | ICD-10-CM | POA: Diagnosis not present

## 2022-04-14 NOTE — Patient Instructions (Signed)
Medication Instructions:  Your physician recommends that you continue on your current medications as directed. Please refer to the Current Medication list given to you today. Please take your blood pressure twice daily for 1 week and send in a MyChart message. Please include heart rates.   HOW TO TAKE YOUR BLOOD PRESSURE: Rest 5 minutes before taking your blood pressure. Don't smoke or drink caffeinated beverages for at least 30 minutes before. Take your blood pressure before (not after) you eat. Sit comfortably with your back supported and both feet on the floor (don't cross your legs). Elevate your arm to heart level on a table or a desk. Use the proper sized cuff. It should fit smoothly and snugly around your bare upper arm. There should be enough room to slip a fingertip under the cuff. The bottom edge of the cuff should be 1 inch above the crease of the elbow. Ideally, take 3 measurements at one sitting and record the average.  *If you need a refill on your cardiac medications before your next appointment, please call your pharmacy*   Follow-Up: At [facility], you and your health needs are our priority.  As part of our continuing mission to provide you with exceptional heart care, we have created designated Provider Care Teams.  These Care Teams include your primary Cardiologist (physician) and Advanced Practice Providers (APPs -  Physician Assistants and Nurse Practitioners) who all work together to provide you with the care you need, when you need it.  We recommend signing up for the patient portal called "MyChart".  Sign up information is provided on this After Visit Summary.  MyChart is used to connect with patients for Virtual Visits (Telemedicine).  Patients are able to view lab/test results, encounter notes, upcoming appointments, etc.  Non-urgent messages can be sent to your provider as well.   To learn more about what you can do with MyChart, go to [email or web address].     Your next appointment:   1 year(s)  The format for your next appointment:   In Person  Provider:   Oswaldo Milian, MD    Other Instructions   Important Information About Sugar

## 2023-01-14 ENCOUNTER — Other Ambulatory Visit: Payer: Self-pay | Admitting: Internal Medicine

## 2023-01-21 ENCOUNTER — Ambulatory Visit
Admission: RE | Admit: 2023-01-21 | Discharge: 2023-01-21 | Disposition: A | Discharge status: Home / Self Care | Payer: 59 | Source: Ambulatory Visit | Attending: Internal Medicine | Admitting: Internal Medicine

## 2023-02-28 ENCOUNTER — Other Ambulatory Visit: Payer: Self-pay

## 2023-02-28 ENCOUNTER — Emergency Department (HOSPITAL_COMMUNITY): Payer: 59

## 2023-02-28 ENCOUNTER — Inpatient Hospital Stay (HOSPITAL_COMMUNITY)
Admission: EM | Admit: 2023-02-28 | Discharge: 2023-03-04 | DRG: 281 | Disposition: A | Discharge status: Home / Self Care | Payer: 59 | Attending: Cardiology | Admitting: Cardiology

## 2023-02-28 DIAGNOSIS — E278 Other specified disorders of adrenal gland: Secondary | ICD-10-CM | POA: Diagnosis present

## 2023-02-28 DIAGNOSIS — I428 Other cardiomyopathies: Secondary | ICD-10-CM

## 2023-02-28 DIAGNOSIS — Z7982 Long term (current) use of aspirin: Secondary | ICD-10-CM

## 2023-02-28 DIAGNOSIS — R7303 Prediabetes: Secondary | ICD-10-CM | POA: Diagnosis present

## 2023-02-28 DIAGNOSIS — I129 Hypertensive chronic kidney disease with stage 1 through stage 4 chronic kidney disease, or unspecified chronic kidney disease: Secondary | ICD-10-CM | POA: Diagnosis present

## 2023-02-28 DIAGNOSIS — R079 Chest pain, unspecified: Secondary | ICD-10-CM | POA: Diagnosis not present

## 2023-02-28 DIAGNOSIS — N400 Enlarged prostate without lower urinary tract symptoms: Secondary | ICD-10-CM | POA: Diagnosis present

## 2023-02-28 DIAGNOSIS — I214 Non-ST elevation (NSTEMI) myocardial infarction: Principal | ICD-10-CM | POA: Diagnosis present

## 2023-02-28 DIAGNOSIS — Z79899 Other long term (current) drug therapy: Secondary | ICD-10-CM

## 2023-02-28 DIAGNOSIS — E785 Hyperlipidemia, unspecified: Secondary | ICD-10-CM | POA: Diagnosis present

## 2023-02-28 DIAGNOSIS — N1831 Chronic kidney disease, stage 3a: Secondary | ICD-10-CM | POA: Diagnosis present

## 2023-02-28 DIAGNOSIS — I10 Essential (primary) hypertension: Secondary | ICD-10-CM | POA: Diagnosis present

## 2023-02-28 DIAGNOSIS — E279 Disorder of adrenal gland, unspecified: Secondary | ICD-10-CM | POA: Diagnosis present

## 2023-02-28 DIAGNOSIS — D72828 Other elevated white blood cell count: Secondary | ICD-10-CM | POA: Diagnosis present

## 2023-02-28 DIAGNOSIS — D72829 Elevated white blood cell count, unspecified: Secondary | ICD-10-CM | POA: Diagnosis present

## 2023-02-28 DIAGNOSIS — I2510 Atherosclerotic heart disease of native coronary artery without angina pectoris: Secondary | ICD-10-CM | POA: Diagnosis present

## 2023-02-28 DIAGNOSIS — I5181 Takotsubo syndrome: Secondary | ICD-10-CM | POA: Diagnosis present

## 2023-02-28 DIAGNOSIS — R000 Tachycardia, unspecified: Secondary | ICD-10-CM | POA: Diagnosis present

## 2023-02-28 LAB — BASIC METABOLIC PANEL
Anion gap: 11 (ref 5–15)
BUN: 25 mg/dL — ABNORMAL HIGH (ref 8–23)
CO2: 23 mmol/L (ref 22–32)
Calcium: 9.5 mg/dL (ref 8.9–10.3)
Chloride: 104 mmol/L (ref 98–111)
Creatinine, Ser: 1.61 mg/dL — ABNORMAL HIGH (ref 0.61–1.24)
GFR, Estimated: 46 mL/min — ABNORMAL LOW (ref >60)
Glucose, Bld: 244 mg/dL — ABNORMAL HIGH (ref 70–99)
Potassium: 4.6 mmol/L (ref 3.5–5.1)
Sodium: 138 mmol/L (ref 135–145)

## 2023-02-28 LAB — I-STAT CHEM 8, ED
BUN: 26 mg/dL — ABNORMAL HIGH (ref 8–23)
Calcium, Ion: 1.21 mmol/L (ref 1.15–1.40)
Chloride: 103 mmol/L (ref 98–111)
Creatinine, Ser: 1.5 mg/dL — ABNORMAL HIGH (ref 0.61–1.24)
Glucose, Bld: 235 mg/dL — ABNORMAL HIGH (ref 70–99)
HCT: 43 % (ref 39.0–52.0)
Hemoglobin: 14.6 g/dL (ref 13.0–17.0)
Potassium: 4.5 mmol/L (ref 3.5–5.1)
Sodium: 138 mmol/L (ref 135–145)
TCO2: 26 mmol/L (ref 22–32)

## 2023-02-28 LAB — CBC
HCT: 44.1 % (ref 39.0–52.0)
Hemoglobin: 14.7 g/dL (ref 13.0–17.0)
MCH: 29.8 pg (ref 26.0–34.0)
MCHC: 33.3 g/dL (ref 30.0–36.0)
MCV: 89.3 fL (ref 80.0–100.0)
Platelets: 444 10*3/uL — ABNORMAL HIGH (ref 150–400)
RBC: 4.94 MIL/uL (ref 4.22–5.81)
RDW: 13.2 % (ref 11.5–15.5)
WBC: 16.5 10*3/uL — ABNORMAL HIGH (ref 4.0–10.5)
nRBC: 0 % (ref 0.0–0.2)

## 2023-02-28 LAB — TROPONIN I (HIGH SENSITIVITY): Troponin I (High Sensitivity): 294 ng/L (ref <18)

## 2023-02-28 MED ORDER — MORPHINE SULFATE (PF) 4 MG/ML IV SOLN
4.0000 mg | Freq: Once | INTRAVENOUS | Status: AC
  Administered 2023-02-28: 4 mg via INTRAVENOUS
  Filled 2023-02-28: qty 1

## 2023-02-28 MED ORDER — NITROGLYCERIN IN D5W 200-5 MCG/ML-% IV SOLN
0.0000 ug/min | INTRAVENOUS | Status: DC
  Administered 2023-02-28: 5 ug/min via INTRAVENOUS
  Administered 2023-02-28: 40 ug/min via INTRAVENOUS
  Administered 2023-03-01: 91.63 ug/min via INTRAVENOUS
  Administered 2023-03-01: 100 ug/min via INTRAVENOUS
  Administered 2023-03-01: 115 ug/min via INTRAVENOUS
  Filled 2023-02-28 (×4): qty 250

## 2023-02-28 MED ORDER — METOPROLOL TARTRATE 5 MG/5ML IV SOLN
5.0000 mg | Freq: Once | INTRAVENOUS | Status: AC
  Administered 2023-02-28: 5 mg via INTRAVENOUS
  Filled 2023-02-28: qty 5

## 2023-02-28 MED ORDER — HEPARIN BOLUS VIA INFUSION
4000.0000 [IU] | Freq: Once | INTRAVENOUS | Status: AC
  Administered 2023-02-28: 4000 [IU] via INTRAVENOUS
  Filled 2023-02-28: qty 4000

## 2023-02-28 MED ORDER — HEPARIN (PORCINE) 25000 UT/250ML-% IV SOLN
1300.0000 [IU]/h | INTRAVENOUS | Status: DC
  Administered 2023-02-28: 1100 [IU]/h via INTRAVENOUS
  Filled 2023-02-28: qty 250

## 2023-02-28 NOTE — ED Triage Notes (Signed)
Pt presents from home for CP that started  Central upper chest, nonradiating, 7/10. EMS arrived to pt 210/120   325 ASA pta by EMS Nitro x2 improved BP to 170/110 and pain to 2/10 H/o htn Hr150bpm with EMS, improved to 135 after 500cc bolus  EKG unremarkable with EMS

## 2023-02-28 NOTE — Progress Notes (Signed)
ANTICOAGULATION CONSULT NOTE - Initial Consult  Pharmacy Consult for heparin Indication: chest pain/ACS  No Known Allergies  Patient Measurements: Weight: 81 kg (178 lb 9.2 oz) Heparin Dosing Weight: 81kg  Vital Signs: BP: 134/96 (05/20 2220) Pulse Rate: 115 (05/20 2220)  Labs: Recent Labs    02/28/23 2115 02/28/23 2134  HGB 14.7 14.6  HCT 44.1 43.0  PLT 444*  --   CREATININE 1.61* 1.50*  TROPONINIHS 294*  --     CrCl cannot be calculated (Unknown ideal weight.).   Medical History: Past Medical History:  Diagnosis Date   Hypertension     Assessment: 81 YOM presenting with CP, elevated troponin, he is not on anticoagulation PTA, CBC wnl  Goal of Therapy:  Heparin level 0.3-0.7 units/ml Monitor platelets by anticoagulation protocol: Yes   Plan:  Heparin 4000 units IV x 1, and gtt at 1100 units/hr F/u 6 hour heparin level F/u cards eval and recs  Daylene Posey, PharmD, [facility] Clinical Pharmacist ED Pharmacist Phone # [phone] 02/28/2023 10:41 PM

## 2023-02-28 NOTE — ED Notes (Signed)
EDP notified on elevated Troponin result.  

## 2023-02-28 NOTE — ED Notes (Signed)
Admitting MD at bedside.

## 2023-02-28 NOTE — ED Provider Notes (Signed)
[facility] AT [facility] Provider Note   CSN: [id number] Arrival date & time: 02/28/23  2111     History  Chief Complaint  Patient presents with   Chest Pain    Shane QURAISHI is a 67 y.o. male.  67 year old male with prior medical history as detailed below presents for evaluation.  Patient reports onset of anterior chest pain.  Pain began around 630 this evening.  He reports significant pressure to the central upper chest.  He denies associated nausea or vomiting.  He denies shortness of breath.  He denies prior known cardiac disease.  EMS administered 325 mg of aspirin during transport.  EMS gave 2 sublingual nitroglycerin during transport with improvement in patient's reported pain.  Patient was significantly hypertensive on initial EMS evaluation with a BP of 210/120.  Heart rate was as high as 150 with EMS.  The history is provided by the patient and medical records.       Home Medications Prior to Admission medications   Medication Sig Start Date End Date Taking? Authorizing Provider  amLODipine (NORVASC) 10 MG tablet Take 10 mg by mouth daily.    [provider]  aspirin 81 MG tablet Take 81 mg by mouth daily.    [provider]  atorvastatin (LIPITOR) 40 MG tablet Take 40 mg by mouth daily.    [provider]  carvedilol (COREG) 12.5 MG tablet Take 1 tablet (12.5 mg total) by mouth 2 (two) times daily. 10/01/21 12/30/21  Little Ishikawa, MD  enalapril (VASOTEC) 20 MG tablet Take 20 mg by mouth daily.    [provider]  ezetimibe (ZETIA) 10 MG tablet     [provider]  hydrALAZINE (APRESOLINE) 50 MG tablet Take 1 tablet twice a day by oral route.    [provider]  tamsulosin (FLOMAX) 0.4 MG CAPS capsule Take 1 capsule every day by oral route.    [provider]      Allergies    Patient has no known allergies.    Review of Systems   Review of Systems  All  other systems reviewed and are negative.   Physical Exam Updated Vital Signs Wt 81 kg   BMI 24.22 kg/m  Physical Exam Vitals and nursing note reviewed.  Constitutional:      General: He is not in acute distress.    Appearance: Normal appearance. He is well-developed.     Comments: Alert, appears uncomfortable  HENT:     Head: Normocephalic and atraumatic.  Eyes:     Conjunctiva/sclera: Conjunctivae normal.     Pupils: Pupils are equal, round, and reactive to light.  Cardiovascular:     Rate and Rhythm: Regular rhythm. Tachycardia present.     Heart sounds: Normal heart sounds.  Pulmonary:     Effort: Pulmonary effort is normal. No respiratory distress.     Breath sounds: Normal breath sounds.  Abdominal:     General: There is no distension.     Palpations: Abdomen is soft.     Tenderness: There is no abdominal tenderness.  Musculoskeletal:        General: No deformity. Normal range of motion.     Cervical back: Normal range of motion and neck supple.  Skin:    General: Skin is warm and dry.  Neurological:     General: No focal deficit present.     Mental Status: He is alert and oriented to person, place, and time.  ED Results / Procedures / Treatments   Labs (all labs ordered are listed, but only abnormal results are displayed) Labs Reviewed  CBC - Abnormal; Notable for the following components:      Result Value   WBC 16.5 (*)    Platelets 444 (*)    All other components within normal limits  I-STAT CHEM 8, ED - Abnormal; Notable for the following components:   BUN 26 (*)    Creatinine, Ser 1.50 (*)    Glucose, Bld 235 (*)    All other components within normal limits  BASIC METABOLIC PANEL  TROPONIN I (HIGH SENSITIVITY)    EKG EKG Interpretation  Date/Time:  Monday Feb 28 2023 21:44:30 EDT Ventricular Rate:  93 PR Interval:  192 QRS Duration: 91 QT Interval:  313 QTC Calculation: 390 R Axis:   2 Text Interpretation: Sinus rhythm Probable left  atrial enlargement Confirmed by Kristine Royal [phone]) on 02/28/2023 9:47:48 PM  Radiology No results found.  Procedures Procedures    Medications Ordered in ED Medications  nitroGLYCERIN 50 mg in dextrose 5 % 250 mL (0.2 mg/mL) infusion (5 mcg/min Intravenous New Bag/Given 02/28/23 2148)  morphine (PF) 4 MG/ML injection 4 mg (4 mg Intravenous Given 02/28/23 2133)  metoprolol tartrate (LOPRESSOR) injection 5 mg (5 mg Intravenous Given 02/28/23 2135)    ED Course/ Medical Decision Making/ A&P                             Medical Decision Making Amount and/or Complexity of Data Reviewed Labs: ordered. Radiology: ordered.  Risk Prescription drug management. Decision regarding hospitalization.    Medical Screen Complete  This patient presented to the ED with complaint of chest pain.  This complaint involves an extensive number of treatment options. The initial differential diagnosis includes, but is not limited to, ACS, hypertensive urgency, metabolic abnormality, etc.   This presentation is: Acute, Self-Limited, Previously Undiagnosed, Uncertain Prognosis, Complicated, Systemic Symptoms, and Threat to Life/Bodily Function  Patient presents with acute onset chest pain.  On the pain was at 6:30 PM.  Patient reports significant substernal chest pain associated with hypertension and tachycardia.  Patient's pain is improving with administration of nitroglycerin.  With use of nitroglycerin drip and morphine and Lopressor patient's heart rate and blood pressure is improved.  Patient reports significant improvement in chest discomfort.  Initial troponin is 294.  EKG was without clear evidence of acute ischemia.  Presentation is concerning for NSTEMI.  Heparin drip initiated.  Cardiology Hyacinth Meeker) is aware of case and will evaluate for admission.   Additional history obtained:  Additional history obtained from EMS External records from outside sources obtained and reviewed  including prior ED visits and prior Inpatient records.    Lab Tests:  I ordered and personally interpreted labs.     Imaging Studies ordered:  I ordered imaging studies including CXR  I independently visualized and interpreted obtained imaging which showed NAD I agree with the radiologist interpretation.   Cardiac Monitoring:  The patient was maintained on a cardiac monitor.  I personally viewed and interpreted the cardiac monitor which showed an underlying rhythm of: sinus tach   Medicines ordered:  I ordered medication including ntg gtt, morphine, heparin  for suspected ACS  Reevaluation of the patient after these medicines showed that the patient: improved   Problem List / ED Course:  Chest pain, suspected ACS   Reevaluation:  After the interventions noted above, I reevaluated  the patient and found that they have: improved  Disposition:  After consideration of the diagnostic results and the patients response to treatment, I feel that the patent would benefit from admission.   CRITICAL CARE Performed by: Wynetta Fines   Total critical care time: 30 minutes  Critical care time was exclusive of separately billable procedures and treating other patients.  Critical care was necessary to treat or prevent imminent or life-threatening deterioration.  Critical care was time spent personally by me on the following activities: development of treatment plan with patient and/or surrogate as well as nursing, discussions with consultants, evaluation of patient's response to treatment, examination of patient, obtaining history from patient or surrogate, ordering and performing treatments and interventions, ordering and review of laboratory studies, ordering and review of radiographic studies, pulse oximetry and re-evaluation of patient's condition.          Final Clinical Impression(s) / ED Diagnoses Final diagnoses:  Chest pain, unspecified type    Rx / DC  Orders ED Discharge Orders     None         Wynetta Fines, MD 02/28/23 2318

## 2023-03-01 ENCOUNTER — Inpatient Hospital Stay (HOSPITAL_COMMUNITY): Payer: 59

## 2023-03-01 ENCOUNTER — Other Ambulatory Visit (HOSPITAL_COMMUNITY): Payer: 59

## 2023-03-01 ENCOUNTER — Encounter (HOSPITAL_COMMUNITY)
Admission: EM | Disposition: A | Discharge status: Home / Self Care | Payer: Self-pay | Source: Home / Self Care | Attending: Cardiology

## 2023-03-01 ENCOUNTER — Encounter (HOSPITAL_COMMUNITY): Payer: Self-pay | Admitting: Internal Medicine

## 2023-03-01 DIAGNOSIS — Z79899 Other long term (current) drug therapy: Secondary | ICD-10-CM | POA: Diagnosis not present

## 2023-03-01 DIAGNOSIS — N1831 Chronic kidney disease, stage 3a: Secondary | ICD-10-CM | POA: Diagnosis present

## 2023-03-01 DIAGNOSIS — Z7982 Long term (current) use of aspirin: Secondary | ICD-10-CM | POA: Diagnosis not present

## 2023-03-01 DIAGNOSIS — I214 Non-ST elevation (NSTEMI) myocardial infarction: Secondary | ICD-10-CM | POA: Diagnosis present

## 2023-03-01 DIAGNOSIS — N400 Enlarged prostate without lower urinary tract symptoms: Secondary | ICD-10-CM | POA: Diagnosis present

## 2023-03-01 DIAGNOSIS — R7303 Prediabetes: Secondary | ICD-10-CM | POA: Diagnosis present

## 2023-03-01 DIAGNOSIS — I2510 Atherosclerotic heart disease of native coronary artery without angina pectoris: Secondary | ICD-10-CM

## 2023-03-01 DIAGNOSIS — E785 Hyperlipidemia, unspecified: Secondary | ICD-10-CM | POA: Diagnosis present

## 2023-03-01 DIAGNOSIS — I5181 Takotsubo syndrome: Secondary | ICD-10-CM | POA: Diagnosis present

## 2023-03-01 DIAGNOSIS — R079 Chest pain, unspecified: Secondary | ICD-10-CM | POA: Diagnosis present

## 2023-03-01 DIAGNOSIS — R000 Tachycardia, unspecified: Secondary | ICD-10-CM | POA: Diagnosis present

## 2023-03-01 DIAGNOSIS — D72828 Other elevated white blood cell count: Secondary | ICD-10-CM | POA: Diagnosis present

## 2023-03-01 DIAGNOSIS — E279 Disorder of adrenal gland, unspecified: Secondary | ICD-10-CM | POA: Diagnosis present

## 2023-03-01 DIAGNOSIS — I129 Hypertensive chronic kidney disease with stage 1 through stage 4 chronic kidney disease, or unspecified chronic kidney disease: Secondary | ICD-10-CM | POA: Diagnosis present

## 2023-03-01 DIAGNOSIS — I2489 Other forms of acute ischemic heart disease: Secondary | ICD-10-CM | POA: Diagnosis not present

## 2023-03-01 DIAGNOSIS — I519 Heart disease, unspecified: Secondary | ICD-10-CM | POA: Diagnosis not present

## 2023-03-01 HISTORY — PX: LEFT HEART CATH AND CORONARY ANGIOGRAPHY: CATH118249

## 2023-03-01 LAB — CBC
HCT: 41.7 % (ref 39.0–52.0)
Hemoglobin: 13.9 g/dL (ref 13.0–17.0)
MCH: 30 pg (ref 26.0–34.0)
MCHC: 33.3 g/dL (ref 30.0–36.0)
MCV: 89.9 fL (ref 80.0–100.0)
Platelets: 426 10*3/uL — ABNORMAL HIGH (ref 150–400)
RBC: 4.64 MIL/uL (ref 4.22–5.81)
RDW: 13.4 % (ref 11.5–15.5)
WBC: 15.5 10*3/uL — ABNORMAL HIGH (ref 4.0–10.5)
nRBC: 0 % (ref 0.0–0.2)

## 2023-03-01 LAB — COMPREHENSIVE METABOLIC PANEL
ALT: 24 U/L (ref 0–44)
AST: 22 U/L (ref 15–41)
Albumin: 3.7 g/dL (ref 3.5–5.0)
Alkaline Phosphatase: 74 U/L (ref 38–126)
Anion gap: 11 (ref 5–15)
BUN: 27 mg/dL — ABNORMAL HIGH (ref 8–23)
CO2: 23 mmol/L (ref 22–32)
Calcium: 9.3 mg/dL (ref 8.9–10.3)
Chloride: 104 mmol/L (ref 98–111)
Creatinine, Ser: 1.69 mg/dL — ABNORMAL HIGH (ref 0.61–1.24)
GFR, Estimated: 44 mL/min — ABNORMAL LOW (ref >60)
Glucose, Bld: 149 mg/dL — ABNORMAL HIGH (ref 70–99)
Potassium: 4.1 mmol/L (ref 3.5–5.1)
Sodium: 138 mmol/L (ref 135–145)
Total Bilirubin: 1.2 mg/dL (ref 0.3–1.2)
Total Protein: 6.5 g/dL (ref 6.5–8.1)

## 2023-03-01 LAB — ECHOCARDIOGRAM COMPLETE
Area-P 1/2: 5.64 cm2
Calc EF: 46.8 %
Height: 72 in
S' Lateral: 3.3 cm
Single Plane A2C EF: 50.2 %
Single Plane A4C EF: 42.4 %
Weight: 2761.6 oz

## 2023-03-01 LAB — D-DIMER, QUANTITATIVE: D-Dimer, Quant: <0.27 ug/mL-FEU (ref 0.00–0.50)

## 2023-03-01 LAB — HEMOGLOBIN A1C
Hgb A1c MFr Bld: 6.3 % — ABNORMAL HIGH (ref 4.8–5.6)
Mean Plasma Glucose: 134.11 mg/dL

## 2023-03-01 LAB — LIPID PANEL
Cholesterol: 90 mg/dL (ref 0–200)
HDL: 30 mg/dL — ABNORMAL LOW (ref >40)
LDL Cholesterol: 43 mg/dL (ref 0–99)
Total CHOL/HDL Ratio: 3 RATIO
Triglycerides: 86 mg/dL (ref <150)
VLDL: 17 mg/dL (ref 0–40)

## 2023-03-01 LAB — PROTIME-INR
INR: 1.2 (ref 0.8–1.2)
Prothrombin Time: 15.2 seconds (ref 11.4–15.2)

## 2023-03-01 LAB — HIV ANTIBODY (ROUTINE TESTING W REFLEX): HIV Screen 4th Generation wRfx: NONREACTIVE

## 2023-03-01 LAB — HEPARIN LEVEL (UNFRACTIONATED): Heparin Unfractionated: 0.22 IU/mL — ABNORMAL LOW (ref 0.30–0.70)

## 2023-03-01 LAB — TROPONIN I (HIGH SENSITIVITY): Troponin I (High Sensitivity): 1420 ng/L (ref <18)

## 2023-03-01 SURGERY — LEFT HEART CATH AND CORONARY ANGIOGRAPHY
Anesthesia: LOCAL

## 2023-03-01 MED ORDER — FENTANYL CITRATE (PF) 100 MCG/2ML IJ SOLN
INTRAMUSCULAR | Status: AC
  Filled 2023-03-01: qty 2

## 2023-03-01 MED ORDER — MORPHINE SULFATE (PF) 2 MG/ML IV SOLN
2.0000 mg | Freq: Once | INTRAVENOUS | Status: AC
  Administered 2023-03-01: 2 mg via INTRAVENOUS
  Filled 2023-03-01: qty 1

## 2023-03-01 MED ORDER — LIDOCAINE HCL (PF) 1 % IJ SOLN
INTRAMUSCULAR | Status: AC
  Filled 2023-03-01: qty 30

## 2023-03-01 MED ORDER — SODIUM CHLORIDE 0.9 % WEIGHT BASED INFUSION
3.0000 mL/kg/h | INTRAVENOUS | Status: DC
  Administered 2023-03-01: 3 mL/kg/h via INTRAVENOUS

## 2023-03-01 MED ORDER — ENALAPRIL MALEATE 10 MG PO TABS: 20.0000 mg | ORAL_TABLET | Freq: Every day | ORAL | Status: DC

## 2023-03-01 MED ORDER — NITROGLYCERIN 0.4 MG SL SUBL: 0.4000 mg | SUBLINGUAL_TABLET | SUBLINGUAL | Status: DC | PRN

## 2023-03-01 MED ORDER — ONDANSETRON HCL 4 MG/2ML IJ SOLN: 4.0000 mg | Freq: Four times a day (QID) | INTRAMUSCULAR | Status: DC | PRN

## 2023-03-01 MED ORDER — LABETALOL HCL 5 MG/ML IV SOLN
10.0000 mg | INTRAVENOUS | Status: AC | PRN
  Administered 2023-03-01: 10 mg via INTRAVENOUS
  Filled 2023-03-01 (×3): qty 4

## 2023-03-01 MED ORDER — ATORVASTATIN CALCIUM 80 MG PO TABS
80.0000 mg | ORAL_TABLET | Freq: Every day | ORAL | Status: DC
  Administered 2023-03-01 – 2023-03-04 (×4): 80 mg via ORAL
  Filled 2023-03-01 (×4): qty 1

## 2023-03-01 MED ORDER — SODIUM CHLORIDE 0.9 % IV SOLN: 250.0000 mL | INTRAVENOUS | Status: DC | PRN

## 2023-03-01 MED ORDER — MIDAZOLAM HCL 2 MG/2ML IJ SOLN
INTRAMUSCULAR | Status: DC | PRN
  Administered 2023-03-01: 2 mg via INTRAVENOUS

## 2023-03-01 MED ORDER — VERAPAMIL HCL 2.5 MG/ML IV SOLN
INTRAVENOUS | Status: AC
  Filled 2023-03-01: qty 2

## 2023-03-01 MED ORDER — CARVEDILOL 12.5 MG PO TABS
12.5000 mg | ORAL_TABLET | Freq: Two times a day (BID) | ORAL | Status: DC
  Administered 2023-03-01 – 2023-03-04 (×7): 12.5 mg via ORAL
  Filled 2023-03-01 (×7): qty 1

## 2023-03-01 MED ORDER — ACETAMINOPHEN 325 MG PO TABS
650.0000 mg | ORAL_TABLET | ORAL | Status: DC | PRN
  Administered 2023-03-02: 650 mg via ORAL

## 2023-03-01 MED ORDER — HEPARIN (PORCINE) 25000 UT/250ML-% IV SOLN
1500.0000 [IU]/h | INTRAVENOUS | Status: DC
  Administered 2023-03-01: 1300 [IU]/h via INTRAVENOUS
  Filled 2023-03-01 (×2): qty 250

## 2023-03-01 MED ORDER — AMLODIPINE BESYLATE 10 MG PO TABS
10.0000 mg | ORAL_TABLET | Freq: Every day | ORAL | Status: DC
  Administered 2023-03-01 – 2023-03-03 (×3): 10 mg via ORAL
  Filled 2023-03-01 (×3): qty 1
  Filled 2023-03-01: qty 2

## 2023-03-01 MED ORDER — HYDRALAZINE HCL 50 MG PO TABS
50.0000 mg | ORAL_TABLET | Freq: Two times a day (BID) | ORAL | Status: DC
  Administered 2023-03-01 – 2023-03-03 (×5): 50 mg via ORAL
  Filled 2023-03-01 (×5): qty 1

## 2023-03-01 MED ORDER — HEPARIN SODIUM (PORCINE) 1000 UNIT/ML IJ SOLN
INTRAMUSCULAR | Status: AC
  Filled 2023-03-01: qty 10

## 2023-03-01 MED ORDER — LIDOCAINE HCL (PF) 1 % IJ SOLN
INTRAMUSCULAR | Status: DC | PRN
  Administered 2023-03-01: 2 mL

## 2023-03-01 MED ORDER — HEPARIN BOLUS VIA INFUSION
2000.0000 [IU] | Freq: Once | INTRAVENOUS | Status: AC
  Administered 2023-03-01: 2000 [IU] via INTRAVENOUS
  Filled 2023-03-01: qty 2000

## 2023-03-01 MED ORDER — SODIUM CHLORIDE 0.9% FLUSH: 3.0000 mL | INTRAVENOUS | Status: DC | PRN

## 2023-03-01 MED ORDER — DEXAMETHASONE 0.5 MG PO TABS
1.0000 mg | ORAL_TABLET | Freq: Once | ORAL | Status: AC
  Administered 2023-03-01: 1 mg via ORAL
  Filled 2023-03-01: qty 2

## 2023-03-01 MED ORDER — ACETAMINOPHEN 325 MG PO TABS
650.0000 mg | ORAL_TABLET | ORAL | Status: DC | PRN
  Filled 2023-03-01: qty 2

## 2023-03-01 MED ORDER — VERAPAMIL HCL 2.5 MG/ML IV SOLN
INTRAVENOUS | Status: DC | PRN
  Administered 2023-03-01: 10 mL via INTRA_ARTERIAL

## 2023-03-01 MED ORDER — SODIUM CHLORIDE 0.9 % IV SOLN: INTRAVENOUS | Status: AC

## 2023-03-01 MED ORDER — ASPIRIN 81 MG PO TBEC
81.0000 mg | DELAYED_RELEASE_TABLET | Freq: Every day | ORAL | Status: DC
  Administered 2023-03-02 – 2023-03-04 (×3): 81 mg via ORAL
  Filled 2023-03-01 (×3): qty 1

## 2023-03-01 MED ORDER — HEPARIN SODIUM (PORCINE) 1000 UNIT/ML IJ SOLN
INTRAMUSCULAR | Status: DC | PRN
  Administered 2023-03-01: 4000 [IU] via INTRAVENOUS

## 2023-03-01 MED ORDER — IOHEXOL 350 MG/ML SOLN
INTRAVENOUS | Status: DC | PRN
  Administered 2023-03-01: 65 mL

## 2023-03-01 MED ORDER — SODIUM CHLORIDE 0.9 % WEIGHT BASED INFUSION
1.0000 mL/kg/h | INTRAVENOUS | Status: DC
  Administered 2023-03-01: 1 mL/kg/h via INTRAVENOUS

## 2023-03-01 MED ORDER — HEPARIN (PORCINE) IN NACL 1000-0.9 UT/500ML-% IV SOLN
INTRAVENOUS | Status: DC | PRN
  Administered 2023-03-01 (×2): 500 mL

## 2023-03-01 MED ORDER — TAMSULOSIN HCL 0.4 MG PO CAPS
0.4000 mg | ORAL_CAPSULE | Freq: Every day | ORAL | Status: DC
  Administered 2023-03-01 – 2023-03-04 (×4): 0.4 mg via ORAL
  Filled 2023-03-01 (×4): qty 1

## 2023-03-01 MED ORDER — SODIUM CHLORIDE 0.9% FLUSH
3.0000 mL | Freq: Two times a day (BID) | INTRAVENOUS | Status: DC
  Administered 2023-03-02: 3 mL via INTRAVENOUS
  Administered 2023-03-02: 10 mL via INTRAVENOUS
  Administered 2023-03-03 – 2023-03-04 (×3): 3 mL via INTRAVENOUS

## 2023-03-01 MED ORDER — ASPIRIN 81 MG PO CHEW
81.0000 mg | CHEWABLE_TABLET | ORAL | Status: AC
  Administered 2023-03-01: 81 mg via ORAL
  Filled 2023-03-01: qty 1

## 2023-03-01 MED ORDER — MIDAZOLAM HCL 2 MG/2ML IJ SOLN
INTRAMUSCULAR | Status: AC
  Filled 2023-03-01: qty 2

## 2023-03-01 MED ORDER — FENTANYL CITRATE (PF) 100 MCG/2ML IJ SOLN
INTRAMUSCULAR | Status: DC | PRN
  Administered 2023-03-01: 25 ug via INTRAVENOUS

## 2023-03-01 MED ORDER — VERAPAMIL HCL 2.5 MG/ML IV SOLN
INTRAVENOUS | Status: DC | PRN
  Administered 2023-03-01: 2.5 mg via INTRA_ARTERIAL

## 2023-03-01 MED ORDER — EZETIMIBE 10 MG PO TABS
10.0000 mg | ORAL_TABLET | Freq: Every day | ORAL | Status: DC
  Administered 2023-03-01 – 2023-03-04 (×4): 10 mg via ORAL
  Filled 2023-03-01 (×4): qty 1

## 2023-03-01 MED ORDER — IOHEXOL 350 MG/ML SOLN
60.0000 mL | Freq: Once | INTRAVENOUS | Status: AC | PRN
  Administered 2023-03-01: 60 mL via INTRAVENOUS

## 2023-03-01 MED ORDER — HYDRALAZINE HCL 20 MG/ML IJ SOLN
10.0000 mg | INTRAMUSCULAR | Status: AC | PRN
  Administered 2023-03-01: 10 mg via INTRAVENOUS
  Filled 2023-03-01: qty 1

## 2023-03-01 SURGICAL SUPPLY — 12 items
BAND CMPR LRG ZPHR (HEMOSTASIS) ×1
BAND ZEPHYR COMPRESS 30 LONG (HEMOSTASIS) IMPLANT
CATH 5FR JL3.5 JR4 ANG PIG MP (CATHETERS) IMPLANT
GLIDESHEATH SLEND SS 6F .021 (SHEATH) IMPLANT
GUIDEWIRE INQWIRE 1.5J.035X260 (WIRE) IMPLANT
INQWIRE 1.5J .035X260CM (WIRE) ×1
KIT HEART LEFT (KITS) ×1 IMPLANT
PACK CARDIAC CATHETERIZATION (CUSTOM PROCEDURE TRAY) ×1 IMPLANT
SHEATH 6FR 85 DEST SLENDER (SHEATH) IMPLANT
SHEATH PROBE COVER 6X72 (BAG) IMPLANT
TRANSDUCER W/STOPCOCK (MISCELLANEOUS) ×1 IMPLANT
TUBING CIL FLEX 10 FLL-RA (TUBING) ×1 IMPLANT

## 2023-03-01 NOTE — Progress Notes (Signed)
Echocardiogram 2D Echocardiogram has been performed.  Toni Amend 03/01/2023, 3:32 PM

## 2023-03-01 NOTE — Progress Notes (Signed)
Paged Su Hilt, NP for clarification on nitro gtt. Per NP, give PO medications and then wean nitro gtt.   Despite PO medications, patient's BP now 150/86. Per cardiology, decrease nitro from 115 to 80 mg, then decrease by 20 mcg Q 15.   Shortly after decreasing nitro Patient c/o 10/10 chest pain. Obtained EKG, called rapid response, increased nitro to 115. PRN labetalol and 2 mg morphine given, patient reports chest pain has resolved. Su Hilt, NP at the bedside, continue to wean nitro, CTA ordered.   After returning from CT patient's chest pain began to return, now 4/10. HR 120's, BP 182/100. Paged cards, instructed to titrate up on nitro to control BP, and 2 mg morphine ordered for pain.   BP improved and chest pain resolved, HR now 120's - 140's, cardiology is aware, no new orders at this time.

## 2023-03-01 NOTE — ED Notes (Signed)
Pt to cath lab on monitor with this rn

## 2023-03-01 NOTE — Interval H&P Note (Signed)
Cath Lab Visit (complete for each Cath Lab visit)  Clinical Evaluation Leading to the Procedure:   ACS: Yes.    Non-ACS:    Anginal Classification: CCS IV  Anti-ischemic medical therapy: Minimal Therapy (1 class of medications)  Non-Invasive Test Results: No non-invasive testing performed  Prior CABG: No previous CABG      History and Physical Interval Note:  03/01/2023 11:41 AM  Shane Sampson  has presented today for surgery, with the diagnosis of nstemi.  The various methods of treatment have been discussed with the patient and family. After consideration of risks, benefits and other options for treatment, the patient has consented to  Procedure(s): LEFT HEART CATH AND CORONARY ANGIOGRAPHY (N/A) as a surgical intervention.  The patient's history has been reviewed, patient examined, no change in status, stable for surgery.  I have reviewed the patient's chart and labs.  Questions were answered to the patient's satisfaction.     Lance Muss

## 2023-03-01 NOTE — Progress Notes (Addendum)
    Called by RN with patient developing recurrent episode of intense centralized chest pain this afternoon. Has been on IV nitro with attempts to wean down. Has been persistently tachycardic since admission. Does report he flew to [employer] and back over the weekend. No reports of LE edema. At the time of exam, chest pain improving/resolved -- will obtain stat CT angio, Ddimer -- IV heparin being resumed 2 hrs post TR band removal -- continue to attempt wean of IV nitro -- discussed with attending, signed out to PM coverage team  Signed, Laverda Page, NP-C 03/01/2023, 5:20 PM Pager: [phone]

## 2023-03-01 NOTE — Progress Notes (Addendum)
   Follow CTA PE study negative for PE or dissection.  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   Adrenal gland mass - Will need abdomen MRI and surgery consult tomorrow.  - will obtain metanephrines, aldosterone, renin, dexamethasone suppression test  - Consider transfer patient to internal medicine service tomorrow  Chest pain - maybe HTN urgency related, BP elevated to 190 systolic per nurse report. Advised the nurse to uptitrate nitroglycerin for blood pressure control.  Will repeat PRN morphine 2mg  x1. Continue PO antihypertensive. Up titrate  when needed

## 2023-03-01 NOTE — ED Notes (Signed)
1000 meds held per Cards.

## 2023-03-01 NOTE — H&P (View-Only) (Signed)
Rounding Note    Patient Name: Shane Sampson Date of Encounter: 03/01/2023  [facility] Cardiologist: None   Subjective   No chest pain this morning. Resting comfortable in bed.   Inpatient Medications    Scheduled Meds:  amLODipine  10 mg Oral Daily   aspirin  81 mg Oral Pre-Cath   aspirin EC  81 mg Oral Daily   atorvastatin  80 mg Oral Daily   carvedilol  12.5 mg Oral BID WC   enalapril  20 mg Oral Daily   ezetimibe  10 mg Oral Daily   hydrALAZINE  50 mg Oral BID   tamsulosin  0.4 mg Oral Daily   Continuous Infusions:  sodium chloride     Followed by   sodium chloride     heparin 1,300 Units/hr (03/01/23 0512)   nitroGLYCERIN 100 mcg/min (03/01/23 0723)   PRN Meds: acetaminophen, nitroGLYCERIN, ondansetron (ZOFRAN) IV   Vital Signs    Vitals:   03/01/23 0515 03/01/23 0518 03/01/23 0600 03/01/23 0645  BP: 111/77  119/79 115/85  Pulse: (!) 109  (!) 106 99  Resp: (!) 21  19 12   Temp:  98 F (36.7 C)    TempSrc:      SpO2: 95%  93% 96%  Weight:      Height:       No intake or output data in the 24 hours ending 03/01/23 0723    02/28/2023   10:48 PM 02/28/2023    9:16 PM 04/14/2022    3:58 PM  Last 3 Weights  Weight (lbs) 175 lb 178 lb 9.2 oz 179 lb  Weight (kg) 79.379 kg 81 kg 81.194 kg      Telemetry    Sinus tachycardia 100-110s - Personally Reviewed  ECG    Sinus tachycardia 120 - Personally Reviewed  Physical Exam   GEN: No acute distress.   Neck: No JVD Cardiac: RRR, no murmurs, rubs, or gallops.  Respiratory: Clear to auscultation bilaterally. GI: Soft, nontender, non-distended  MS: No edema; No deformity. Neuro:  Nonfocal  Psych: Normal affect   Labs    High Sensitivity Troponin:   Recent Labs  Lab 02/28/23 2115 02/28/23 2335  TROPONINIHS 294* 1,420*     Chemistry Recent Labs  Lab 02/28/23 2115 02/28/23 2134 03/01/23 0124  NA 138 138 138  K 4.6 4.5 4.1  CL 104 103 104  CO2 23  --  23  GLUCOSE 244* 235*  149*  BUN 25* 26* 27*  CREATININE 1.61* 1.50* 1.69*  CALCIUM 9.5  --  9.3  PROT  --   --  6.5  ALBUMIN  --   --  3.7  AST  --   --  22  ALT  --   --  24  ALKPHOS  --   --  74  BILITOT  --   --  1.2  GFRNONAA 46*  --  44*  ANIONGAP 11  --  11    Lipids  Recent Labs  Lab 03/01/23 0124  CHOL 90  TRIG 86  HDL 30*  LDLCALC 43  CHOLHDL 3.0    Hematology Recent Labs  Lab 02/28/23 2115 02/28/23 2134 03/01/23 0124  WBC 16.5*  --  15.5*  RBC 4.94  --  4.64  HGB 14.7 14.6 13.9  HCT 44.1 43.0 41.7  MCV 89.3  --  89.9  MCH 29.8  --  30.0  MCHC 33.3  --  33.3  RDW 13.2  --  13.4  PLT 444*  --  426*   Thyroid No results for input(s): "TSH", "FREET4" in the last 168 hours.  BNPNo results for input(s): "BNP", "PROBNP" in the last 168 hours.  DDimer No results for input(s): "DDIMER" in the last 168 hours.   Radiology    DG Chest Port 1 View  Result Date: 02/28/2023 CLINICAL DATA:  Chest pain EXAM: PORTABLE CHEST 1 VIEW COMPARISON:  None Available. FINDINGS: The heart size and mediastinal contours are within normal limits. Both lungs are clear. The visualized skeletal structures are unremarkable. IMPRESSION: No active disease. Electronically Signed   By: Helyn Numbers M.D.   On: 02/28/2023 21:50    Cardiac Studies   N/a   Patient Profile     67 y.o. male with PMH of HTN, HLD and BPH who presented with chest pain and elevated troponin.   Assessment & Plan    NSTEMI -- presented to ED with chest pain and shortness of breath after playing golf. hsTn 294>>1420. Received SL nitro with improved symptoms. No further chest pain this morning.  -- continue IV heparin, ASA, statin, coreg and IV nitro -- echo pending  Shared Decision Making/Informed Consent The risks [stroke (1 in 1000), death (1 in 1000), kidney failure [usually temporary] (1 in 500), bleeding (1 in 200), allergic reaction [possibly serious] (1 in 200)], benefits (diagnostic support and management of coronary  artery disease) and alternatives of a cardiac catheterization were discussed in detail with Shane Sampson and he is willing to proceed.  HTN -- initially hypertensive on admission, improved this morning -- continue coreg 12.5mg  BID, hydralazine 50mg  BID, norvasc 10mg , IV nitro -- will hold ACE given plans for cath  HLD -- LDL 43, HDL 30 -- on atorvastatin 40mg  PTA, increased to 80mg   -- continue Zetia 10mg    CKD III -- baseline Cr around 1.5, will give pre cath IVFs -- follow BMET  For questions or updates, please contact [facility] Please consult [email or web address] for contact info under        Signed, Laverda Page, NP  03/01/2023, 7:23 AM     Agree with note by Laverda Page NP-C  Patient of Dr. Nobie Putnam with respecters including treated hypertension hyperlipidemia who was admitted with non-STEMI.  His troponins rose to 1420.  His EKG showed no acute changes.  He is currently pain-free on IV heparin and nitroglycerin.  His exam is benign.  Plan for coronary angiography later this morning.The patient understands that risks included but are not limited to stroke (1 in 1000), death (1 in 1000), kidney failure [usually temporary] (1 in 500), bleeding (1 in 200), allergic reaction [possibly serious] (1 in 200).  The patient understands and agrees to proceed   Runell Gess, M.D., FACP, [facility]ryne Eriksson [facility] [address] [place], [place]  [address]  [phone] 03/01/2023 9:40 AM

## 2023-03-01 NOTE — Progress Notes (Signed)
ANTICOAGULATION CONSULT NOTE - Initial Consult  Pharmacy Consult for heparin Indication: chest pain/ACS  No Known Allergies  Patient Measurements: Height: 6' (182.9 cm) Weight: 78.3 kg (172 lb 9.6 oz) IBW/kg (Calculated) : 77.6 Heparin Dosing Weight: 81kg  Vital Signs: Temp: 97.6 F (36.4 C) (05/21 1416) Temp Source: Oral (05/21 1416) BP: 132/78 (05/21 1426) Pulse Rate: 91 (05/21 1426)  Labs: Recent Labs    02/28/23 2115 02/28/23 2134 02/28/23 2335 03/01/23 0124  HGB 14.7 14.6  --  13.9  HCT 44.1 43.0  --  41.7  PLT 444*  --   --  426*  LABPROT  --   --   --  15.2  INR  --   --   --  1.2  HEPARINUNFRC  --   --   --  0.22*  CREATININE 1.61* 1.50*  --  1.69*  TROPONINIHS 294*  --  1,420*  --      Estimated Creatinine Clearance: 45.9 mL/min (A) (by C-G formula based on SCr of 1.69 mg/dL (H)).   Medical History: Past Medical History:  Diagnosis Date   Hypertension     Assessment: 69 YOM presenting with CP, elevated troponin, he is not on anticoagulation PTA, CBC wnl. S/p LHC on 5/21. Pharmacy consulted to resume IV heparin 2 hours after TR band is removed. TR band was removed ~1600.  Goal of Therapy:  Heparin level 0.3-0.7 units/ml Monitor platelets by anticoagulation protocol: Yes   Plan:  Resume heparin drip at 1300 units/hr at 6pm Check heparin level 6 hours post drip re-initiation  Rexford Maus, PharmD, BCPS 03/01/2023 4:08 PM

## 2023-03-01 NOTE — Progress Notes (Signed)
Rapid Response Note  Called for central non-radiating chest pain, fluctuating pain, stabbing 10/10 at worst and 6-7 constant. Nitro gtt had been titrated down recently; titrating back up with some relief. SBP 200, HR 120; prn labetalol 10mg  given with relief. EKG obtained, no acute changes. Cardiology NP to bedside--ordering CT angio, Ddimer; restarting IV heparin. Continue to wean nitro gtt.   176/107 (123) HR 115 RR 20 O2 96% 2L [place]  Quinto Tippy Rapid Response Nurse

## 2023-03-01 NOTE — Progress Notes (Signed)
ANTICOAGULATION CONSULT NOTE  Pharmacy Consult for heparin Indication: chest pain/ACS Brief A/P: Heparin level subtherapeutic Increase Heparin rate  No Known Allergies  Patient Measurements: Height: 6' (182.9 cm) Weight: 79.4 kg (175 lb) IBW/kg (Calculated) : 77.6 Heparin Dosing Weight: 81kg  Vital Signs: Temp: 98.2 F (36.8 C) (05/21 0100) Temp Source: Oral (05/21 0100) BP: 110/80 (05/21 0430) Pulse Rate: 114 (05/21 0430)  Labs: Recent Labs    02/28/23 2115 02/28/23 2134 02/28/23 2335 03/01/23 0124  HGB 14.7 14.6  --  13.9  HCT 44.1 43.0  --  41.7  PLT 444*  --   --  426*  LABPROT  --   --   --  15.2  INR  --   --   --  1.2  HEPARINUNFRC  --   --   --  0.22*  CREATININE 1.61* 1.50*  --  1.69*  TROPONINIHS 294*  --  1,420*  --      Estimated Creatinine Clearance: 45.9 mL/min (A) (by C-G formula based on SCr of 1.69 mg/dL (H)).  Assessment: 67 y.o. male with chest pain for heparin   Goal of Therapy:  Heparin level 0.3-0.7 units/ml Monitor platelets by anticoagulation protocol: Yes   Plan:  Heparin 2000 units IV bolus, then increase heparin 1300 units/hr Check heparin level in 6 hours.   Geannie Risen, PharmD, BCPS  03/01/2023 5:09 AM

## 2023-03-01 NOTE — H&P (Signed)
Cardiology Admission History and Physical   Patient ID: Shane Sampson MRN: [id number]; DOB: 07-Jan-1956   Admission date: 02/28/2023  PCP:  Rodrigo Ran, MD   [facility] Providers Cardiologist:  None        Chief Complaint: Chest pain  Patient Profile:   Shane Sampson is a 67 y.o. male with hypertension, hyperlipidemia, BPH who is being seen 03/01/2023 for the evaluation of chest pain.  History of Present Illness:   Mr. Shane Sampson is a 67 year old male with a history of hypertension, hyperlipidemia, BPH who has had well-controlled hypertension for many years on multiple medications and is seen routinely by his primary care doctor.  He is very active at baseline and plays golf walking with clubs routinely.  He has been in his usual state of health up until yesterday when he was on the golf course and had episode of significant chest discomfort and some left arm pain.  Also felt like he was having some heartburn.  It was persistent so he called 911.  No ECG changes initially but was found to be tachycardic and extremely hypertensive to the 200s.  On arrival to the emergency department he was treated with nitroglycerin and his blood pressure came down at that time his symptoms started to improve some to.  However when I saw him he still did have some chest pain despite blood pressure normalizing. Trop 294 >> 1420.    Past Medical History:  Diagnosis Date   Hypertension     Past Surgical History:  Procedure Laterality Date   HERNIA REPAIR       Medications Prior to Admission: Prior to Admission medications   Medication Sig Start Date End Date Taking? Authorizing Provider  amLODipine (NORVASC) 10 MG tablet Take 10 mg by mouth daily.    [provider]  aspirin 81 MG tablet Take 81 mg by mouth daily.    [provider]  atorvastatin (LIPITOR) 40 MG tablet Take 40 mg by mouth daily.    [provider]  carvedilol (COREG) 12.5 MG tablet Take 1 tablet  (12.5 mg total) by mouth 2 (two) times daily. 10/01/21 12/30/21  Little Ishikawa, MD  enalapril (VASOTEC) 20 MG tablet Take 20 mg by mouth daily.    [provider]  ezetimibe (ZETIA) 10 MG tablet     [provider]  hydrALAZINE (APRESOLINE) 50 MG tablet Take 1 tablet twice a day by oral route.    [provider]  tamsulosin (FLOMAX) 0.4 MG CAPS capsule Take 1 capsule every day by oral route.    [provider]     Allergies:   No Known Allergies  Social History:   Social History   Socioeconomic History   Marital status: Married    Spouse name: Not on file   Number of children: Not on file   Years of education: Not on file   Highest education level: Not on file  Occupational History   Not on file  Tobacco Use   Smoking status: Never   Smokeless tobacco: Never  Substance and Sexual Activity   Alcohol use: Yes    Alcohol/week: 1.0 standard drink of alcohol    Types: 1 Standard drinks or equivalent per week   Drug use: No   Sexual activity: Not on file  Other Topics Concern   Not on file  Social History Narrative   Not on file   Social Determinants of Health   Financial Resource Strain: Not  on file  Food Insecurity: Not on file  Transportation Needs: Not on file  Physical Activity: Not on file  Stress: Not on file  Social Connections: Not on file  Intimate Partner Violence: Not on file    Family History:   The patient's family history is not on file.    ROS:  Please see the history of present illness.  All other ROS reviewed and negative.     Physical Exam/Data:   Vitals:   03/01/23 0245 03/01/23 0430 03/01/23 0515 03/01/23 0518  BP: 117/82 110/80 111/77   Pulse: (!) 110 (!) 114 (!) 109   Resp: 16 14 (!) 21   Temp:    98 F (36.7 C)  TempSrc:      SpO2: 98% 96% 95%   Weight:      Height:       No intake or output data in the 24 hours ending 03/01/23 0558    02/28/2023   10:48 PM 02/28/2023    9:16 PM 04/14/2022     3:58 PM  Last 3 Weights  Weight (lbs) 175 lb 178 lb 9.2 oz 179 lb  Weight (kg) 79.379 kg 81 kg 81.194 kg     Body mass index is 23.73 kg/m.  General:  Well nourished, well developed, in no acute distress HEENT: normal Neck: no JVD Vascular: No carotid bruits; Distal pulses 2+ bilaterally   Cardiac:  normal S1, S2; RRR; no murmur  Lungs:  clear to auscultation bilaterally, no wheezing, rhonchi or rales  Abd: soft, nontender, no hepatomegaly  Ext: no edema Musculoskeletal:  No deformities, BUE and BLE strength normal and equal Skin: warm and dry  Neuro:  CNs 2-12 intact, no focal abnormalities noted Psych:  Normal affect    EKG:  The ECG that was done  was personally reviewed and demonstrates sinus tach  Relevant CV Studies: none  Laboratory Data:  High Sensitivity Troponin:   Recent Labs  Lab 02/28/23 2115 02/28/23 2335  TROPONINIHS 294* 1,420*      Chemistry Recent Labs  Lab 02/28/23 2115 02/28/23 2134 03/01/23 0124  NA 138 138 138  K 4.6 4.5 4.1  CL 104 103 104  CO2 23  --  23  GLUCOSE 244* 235* 149*  BUN 25* 26* 27*  CREATININE 1.61* 1.50* 1.69*  CALCIUM 9.5  --  9.3  GFRNONAA 46*  --  44*  ANIONGAP 11  --  11    Recent Labs  Lab 03/01/23 0124  PROT 6.5  ALBUMIN 3.7  AST 22  ALT 24  ALKPHOS 74  BILITOT 1.2   Lipids  Recent Labs  Lab 03/01/23 0124  CHOL 90  TRIG 86  HDL 30*  LDLCALC 43  CHOLHDL 3.0   Hematology Recent Labs  Lab 02/28/23 2115 02/28/23 2134 03/01/23 0124  WBC 16.5*  --  15.5*  RBC 4.94  --  4.64  HGB 14.7 14.6 13.9  HCT 44.1 43.0 41.7  MCV 89.3  --  89.9  MCH 29.8  --  30.0  MCHC 33.3  --  33.3  RDW 13.2  --  13.4  PLT 444*  --  426*   Thyroid No results for input(s): "TSH", "FREET4" in the last 168 hours. BNPNo results for input(s): "BNP", "PROBNP" in the last 168 hours.  DDimer No results for input(s): "DDIMER" in the last 168 hours.   Radiology/Studies:  [facility] Chest Port 1 View  Result Date:  02/28/2023 CLINICAL DATA:  Chest pain EXAM: PORTABLE  CHEST 1 VIEW COMPARISON:  None Available. FINDINGS: The heart size and mediastinal contours are within normal limits. Both lungs are clear. The visualized skeletal structures are unremarkable. IMPRESSION: No active disease. Electronically Signed   By: Helyn Numbers M.D.   On: 02/28/2023 21:50     Assessment and Plan:   NSTEMI.  Typical anginal symptoms and believe the hypertension was secondary to likely ischemic process.  Symptoms were still persistent even after blood pressure dropped but improved some.  He warrants a left heart catheterization.  Plan as below.  - npo after midnight for lhc tomorrow - echo ordered for tomorrow - heparin for anticoagulation - loaded 325 mg ASA; continue 81 mg daily  - hold off on p2y12i until cath - increasehigh intensity statin to atorvastatin 80 mg; continue zetia; check lipid panel and LP(a) - continue coreg 12.5 mg for beta blocker therapy - continue enalapril after cath - will check CBC, CMP, INR, hemoglobin A1c, tsh/FT4 - referral for cardiac rehab - admit for cardiac tele; strict I&Os; daily weights  - sublingual NTG PRN for pain - can escalate to nitro gtt if needed     Risk Assessment/Risk Scores:    TIMI Risk Score for Unstable Angina or Non-ST Elevation MI:   The patient's TIMI risk score is 3, which indicates a 13% risk of all cause mortality, new or recurrent myocardial infarction or need for urgent revascularization in the next 14 days.     Severity of Illness: The appropriate patient status for this patient is INPATIENT. Inpatient status is judged to be reasonable and necessary in order to provide the required intensity of service to ensure the patient's safety. The patient's presenting symptoms, physical exam findings, and initial radiographic and laboratory data in the context of their chronic comorbidities is felt to place them at high risk for further clinical deterioration.  Furthermore, it is not anticipated that the patient will be medically stable for discharge from the hospital within 2 midnights of admission.   * I certify that at the point of admission it is my clinical judgment that the patient will require inpatient hospital care spanning beyond 2 midnights from the point of admission due to high intensity of service, high risk for further deterioration and high frequency of surveillance required.*   For questions or updates, please contact [facility] Please consult [email or web address] for contact info under     Signed, Joellen Jersey, MD  03/01/2023 5:58 AM

## 2023-03-01 NOTE — Progress Notes (Addendum)
 Rounding Note    Patient Name: Shane Sampson Date of Encounter: 03/01/2023  [facility] Cardiologist: None   Subjective   No chest pain this morning. Resting comfortable in bed.   Inpatient Medications    Scheduled Meds:  amLODipine  10 mg Oral Daily   aspirin  81 mg Oral Pre-Cath   aspirin EC  81 mg Oral Daily   atorvastatin  80 mg Oral Daily   carvedilol  12.5 mg Oral BID WC   enalapril  20 mg Oral Daily   ezetimibe  10 mg Oral Daily   hydrALAZINE  50 mg Oral BID   tamsulosin  0.4 mg Oral Daily   Continuous Infusions:  sodium chloride     Followed by   sodium chloride     heparin 1,300 Units/hr (03/01/23 0512)   nitroGLYCERIN 100 mcg/min (03/01/23 0723)   PRN Meds: acetaminophen, nitroGLYCERIN, ondansetron (ZOFRAN) IV   Vital Signs    Vitals:   03/01/23 0515 03/01/23 0518 03/01/23 0600 03/01/23 0645  BP: 111/77  119/79 115/85  Pulse: (!) 109  (!) 106 99  Resp: (!) 21  19 12  Temp:  98 F (36.7 C)    TempSrc:      SpO2: 95%  93% 96%  Weight:      Height:       No intake or output data in the 24 hours ending 03/01/23 0723    02/28/2023   10:48 PM 02/28/2023    9:16 PM 04/14/2022    3:58 PM  Last 3 Weights  Weight (lbs) 175 lb 178 lb 9.2 oz 179 lb  Weight (kg) 79.379 kg 81 kg 81.194 kg      Telemetry    Sinus tachycardia 100-110s - Personally Reviewed  ECG    Sinus tachycardia 120 - Personally Reviewed  Physical Exam   GEN: No acute distress.   Neck: No JVD Cardiac: RRR, no murmurs, rubs, or gallops.  Respiratory: Clear to auscultation bilaterally. GI: Soft, nontender, non-distended  MS: No edema; No deformity. Neuro:  Nonfocal  Psych: Normal affect   Labs    High Sensitivity Troponin:   Recent Labs  Lab 02/28/23 2115 02/28/23 2335  TROPONINIHS 294* 1,420*     Chemistry Recent Labs  Lab 02/28/23 2115 02/28/23 2134 03/01/23 0124  NA 138 138 138  K 4.6 4.5 4.1  CL 104 103 104  CO2 23  --  23  GLUCOSE 244* 235*  149*  BUN 25* 26* 27*  CREATININE 1.61* 1.50* 1.69*  CALCIUM 9.5  --  9.3  PROT  --   --  6.5  ALBUMIN  --   --  3.7  AST  --   --  22  ALT  --   --  24  ALKPHOS  --   --  74  BILITOT  --   --  1.2  GFRNONAA 46*  --  44*  ANIONGAP 11  --  11    Lipids  Recent Labs  Lab 03/01/23 0124  CHOL 90  TRIG 86  HDL 30*  LDLCALC 43  CHOLHDL 3.0    Hematology Recent Labs  Lab 02/28/23 2115 02/28/23 2134 03/01/23 0124  WBC 16.5*  --  15.5*  RBC 4.94  --  4.64  HGB 14.7 14.6 13.9  HCT 44.1 43.0 41.7  MCV 89.3  --  89.9  MCH 29.8  --  30.0  MCHC 33.3  --  33.3  RDW 13.2  --    13.4  PLT 444*  --  426*   Thyroid No results for input(s): "TSH", "FREET4" in the last 168 hours.  BNPNo results for input(s): "BNP", "PROBNP" in the last 168 hours.  DDimer No results for input(s): "DDIMER" in the last 168 hours.   Radiology    DG Chest Port 1 View  Result Date: 02/28/2023 CLINICAL DATA:  Chest pain EXAM: PORTABLE CHEST 1 VIEW COMPARISON:  None Available. FINDINGS: The heart size and mediastinal contours are within normal limits. Both lungs are clear. The visualized skeletal structures are unremarkable. IMPRESSION: No active disease. Electronically Signed   By: Ashesh  Parikh M.D.   On: 02/28/2023 21:50    Cardiac Studies   N/a   Patient Profile     67 y.o. male with PMH of HTN, HLD and BPH who presented with chest pain and elevated troponin.   Assessment & Plan    NSTEMI -- presented to ED with chest pain and shortness of breath after playing golf. hsTn 294>>1420. Received SL nitro with improved symptoms. No further chest pain this morning.  -- continue IV heparin, ASA, statin, coreg and IV nitro -- echo pending  Shared Decision Making/Informed Consent The risks [stroke (1 in 1000), death (1 in 1000), kidney failure [usually temporary] (1 in 500), bleeding (1 in 200), allergic reaction [possibly serious] (1 in 200)], benefits (diagnostic support and management of coronary  artery disease) and alternatives of a cardiac catheterization were discussed in detail with Shane Sampson and he is willing to proceed.  HTN -- initially hypertensive on admission, improved this morning -- continue coreg 12.5mg BID, hydralazine 50mg BID, norvasc 10mg, IV nitro -- will hold ACE given plans for cath  HLD -- LDL 43, HDL 30 -- on atorvastatin 40mg PTA, increased to 80mg  -- continue Zetia 10mg   CKD III -- baseline Cr around 1.5, will give pre cath IVFs -- follow BMET  For questions or updates, please contact [facility] Please consult [email or web address] for contact info under        Signed, Lindsay Roberts, NP  03/01/2023, 7:23 AM     Agree with note by Lindsay Roberts NP-C  Patient of Dr. Chris Schumaan's with respecters including treated hypertension hyperlipidemia who was admitted with non-STEMI.  His troponins rose to 1420.  His EKG showed no acute changes.  He is currently pain-free on IV heparin and nitroglycerin.  His exam is benign.  Plan for coronary angiography later this morning.The patient understands that risks included but are not limited to stroke (1 in 1000), death (1 in 1000), kidney failure [usually temporary] (1 in 500), bleeding (1 in 200), allergic reaction [possibly serious] (1 in 200).  The patient understands and agrees to proceed   Can Lucci J. Myiah Petkus, M.D., FACP, FACC, FAHA, FSCAI [facility] [address] [place], [place]  [address]  [phone] 03/01/2023 9:40 AM  

## 2023-03-02 ENCOUNTER — Inpatient Hospital Stay (HOSPITAL_COMMUNITY): Payer: 59

## 2023-03-02 ENCOUNTER — Encounter (HOSPITAL_COMMUNITY): Payer: Self-pay | Admitting: Interventional Cardiology

## 2023-03-02 DIAGNOSIS — I10 Essential (primary) hypertension: Secondary | ICD-10-CM | POA: Diagnosis present

## 2023-03-02 DIAGNOSIS — E278 Other specified disorders of adrenal gland: Secondary | ICD-10-CM | POA: Diagnosis present

## 2023-03-02 DIAGNOSIS — E785 Hyperlipidemia, unspecified: Secondary | ICD-10-CM | POA: Diagnosis present

## 2023-03-02 DIAGNOSIS — D72829 Elevated white blood cell count, unspecified: Secondary | ICD-10-CM | POA: Diagnosis present

## 2023-03-02 DIAGNOSIS — R7303 Prediabetes: Secondary | ICD-10-CM | POA: Diagnosis present

## 2023-03-02 DIAGNOSIS — I519 Heart disease, unspecified: Secondary | ICD-10-CM

## 2023-03-02 DIAGNOSIS — N1831 Chronic kidney disease, stage 3a: Secondary | ICD-10-CM | POA: Diagnosis present

## 2023-03-02 DIAGNOSIS — I5181 Takotsubo syndrome: Secondary | ICD-10-CM | POA: Insufficient documentation

## 2023-03-02 DIAGNOSIS — N400 Enlarged prostate without lower urinary tract symptoms: Secondary | ICD-10-CM | POA: Diagnosis present

## 2023-03-02 DIAGNOSIS — I428 Other cardiomyopathies: Secondary | ICD-10-CM

## 2023-03-02 DIAGNOSIS — I214 Non-ST elevation (NSTEMI) myocardial infarction: Secondary | ICD-10-CM | POA: Diagnosis not present

## 2023-03-02 LAB — CORTISOL: Cortisol, Plasma: 14.9 ug/dL

## 2023-03-02 LAB — CBC
HCT: 45 % (ref 39.0–52.0)
Hemoglobin: 15.5 g/dL (ref 13.0–17.0)
MCH: 30.2 pg (ref 26.0–34.0)
MCHC: 34.4 g/dL (ref 30.0–36.0)
MCV: 87.7 fL (ref 80.0–100.0)
Platelets: 388 10*3/uL (ref 150–400)
RBC: 5.13 MIL/uL (ref 4.22–5.81)
RDW: 13.2 % (ref 11.5–15.5)
WBC: 17.1 10*3/uL — ABNORMAL HIGH (ref 4.0–10.5)
nRBC: 0 % (ref 0.0–0.2)

## 2023-03-02 LAB — BASIC METABOLIC PANEL
Anion gap: 11 (ref 5–15)
BUN: 15 mg/dL (ref 8–23)
CO2: 24 mmol/L (ref 22–32)
Calcium: 9.2 mg/dL (ref 8.9–10.3)
Chloride: 101 mmol/L (ref 98–111)
Creatinine, Ser: 1.15 mg/dL (ref 0.61–1.24)
GFR, Estimated: >60 mL/min (ref >60)
Glucose, Bld: 178 mg/dL — ABNORMAL HIGH (ref 70–99)
Potassium: 3.9 mmol/L (ref 3.5–5.1)
Sodium: 136 mmol/L (ref 135–145)

## 2023-03-02 LAB — HEPARIN LEVEL (UNFRACTIONATED): Heparin Unfractionated: 0.17 IU/mL — ABNORMAL LOW (ref 0.30–0.70)

## 2023-03-02 MED ORDER — ENOXAPARIN SODIUM 40 MG/0.4ML IJ SOSY: 40.0000 mg | PREFILLED_SYRINGE | Freq: Every day | INTRAMUSCULAR | Status: DC

## 2023-03-02 MED ORDER — COSYNTROPIN 0.25 MG IJ SOLR
0.2500 mg | Freq: Once | INTRAMUSCULAR | Status: AC
  Administered 2023-03-03: 0.25 mg via INTRAVENOUS
  Filled 2023-03-02: qty 0.25

## 2023-03-02 MED ORDER — GADOBUTROL 1 MMOL/ML IV SOLN
10.0000 mL | Freq: Once | INTRAVENOUS | Status: AC | PRN
  Administered 2023-03-02: 10 mL via INTRAVENOUS

## 2023-03-02 MED ORDER — HEPARIN (PORCINE) 25000 UT/250ML-% IV SOLN
1500.0000 [IU]/h | INTRAVENOUS | Status: AC
  Administered 2023-03-02: 1500 [IU]/h via INTRAVENOUS
  Filled 2023-03-02: qty 250

## 2023-03-02 NOTE — Consult Note (Signed)
Initial Consultation Note   Patient: Shane Sampson:[id number] DOB: [birth year] PCP: Rodrigo Ran, MD DOA: 02/28/2023 DOS: the patient was seen and examined on 03/02/2023 Primary service: Rollene Rotunda, MD  Referring physician: Roberts/Hochrein Reason for consult: Came in with chest pain, found to have a left adrenal mass.  Started evaluation with blood work and needs further consideration.  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 -Further evaluation based on testing already ordered -may need hem/onc evaluation  BPH (benign prostatic hyperplasia) -Continue tamsulosin -BPH symptoms may be simply related to BPH but could also be related to underlying adrenal issue - testing ordered  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 Abdominal MRI -I have also reached out to Dr. Debara Pickett to request telephone consult to see if additional testing would be beneficial at this time -Based on MRI results, he will likely need IR-directed biopsy and/or surgery consult for possible resection  Dyslipidemia -Continue atorvastatin, Zetia -LDL is excellent although HDL is  low  Pre-diabetes -A1c 6.3 -Needs diet/exercise (already does this regularly, walks 100 miles/month) -no medications are needed at this time  Essential hypertension -He is on multidrug therapy at baseline - amlodipine, carvedilol, enalapril, hydralazine -His episodes led to elevated BP but this could be an acute phase response or could be suggestive of underlying secondary issue like pheochromocytoma (in the setting of apparent adrenal mass) -Continue home meds for now (enalapril is on hold) -Undergoing further adrenal evaluation  Nonischemic cardiomyopathy (HCC) -Echo with EF 45-50% and grade 1 diastolic dysfunction -Echo was concerning for takotsubo cardiomyopathy vs. Myocarditis -He is scheduled for cardiac MRI per cardiology      [facility] will continue to follow the patient.  HPI: Shane Sampson is a 67 y.o. male with past medical history of HTN, HLD, and BPH who presented early on 5/21 with chest pain.  Cath performed with decreased EF concerning for Takotsubo cardiomyopathy vs. Myocarditis.  He was incidentally found to have an adrenal mass.  He reports that he had a regular day Monday and worked (sells residential real estate) and then played golf for about an hour.  He was at home and noticed some indigestion.  About 40 minutes later he had an episode of flushing and decided to call 911.  EMS provided NTG and the symptoms slowly abated.  He underwent clean catheterization yesterday and had unremarkable day until about 5pm when he had another episode.  This episode was also associated with CP as well as a squeezing pain in his mid/L upper chest with periodic stabs of pain.  This episode happened shortly after decreasing dose of NTG drip and lasted 10-20 minutes and eventually abated with uptitration of NTG drip and resumption of heparin as well as a dose of morphine.   During both episodes, he had marked BP elevation into the 200  range, which has never happened to him before.  He notes no  prior issues with flushing, normal recent exercise tolerance, no other recent health concerns/changes.   Review of Systems: As mentioned in the history of present illness. All other systems reviewed and are negative. Past Medical History:  Diagnosis Date   Hypertension    Past Surgical History:  Procedure Laterality Date   HERNIA REPAIR     LEFT HEART CATH AND CORONARY ANGIOGRAPHY N/A 03/01/2023   Procedure: LEFT HEART CATH AND CORONARY ANGIOGRAPHY;  Surgeon: Corky Crafts, MD;  Location: [facility] INVASIVE CV LAB;  Service: Cardiovascular;  Laterality: N/A;   Social History:  reports that he has never smoked. He has never used smokeless tobacco. He reports current alcohol use of about 1.0 standard drink of alcohol per week. He reports that he does not use drugs.  No Known Allergies  No family history on file.  Prior to Admission medications   Medication Sig Start Date End Date Taking? Authorizing Provider  amLODipine (NORVASC) 10 MG tablet Take 10 mg by mouth daily.    [provider]  aspirin 81 MG tablet Take 81 mg by mouth daily.    [provider]  atorvastatin (LIPITOR) 40 MG tablet Take 40 mg by mouth daily.    [provider]  carvedilol (COREG) 12.5 MG tablet Take 1 tablet (12.5 mg total) by mouth 2 (two) times daily. 10/01/21 12/30/21  Little Ishikawa, MD  enalapril (VASOTEC) 20 MG tablet Take 20 mg by mouth daily.    [provider]  ezetimibe (ZETIA) 10 MG tablet     [provider]  hydrALAZINE (APRESOLINE) 50 MG tablet Take 1 tablet twice a day by oral route.    [provider]  tamsulosin (FLOMAX) 0.4 MG CAPS capsule Take 1 capsule every day by oral route.    [provider]    Physical Exam: Vitals:   03/02/23 0112 03/02/23 0500 03/02/23 0700 03/02/23 1107  BP: (!) 140/83  131/88 (!) 148/95  Pulse: (!) 117  93 (!) 109  Resp: (!) 22  18 20   Temp: 98.7 F (37.1 C)  98.4 F (36.9 C) 97.8 F (36.6  C)  TempSrc: Oral  Oral Oral  SpO2: 94%  93% 94%  Weight:  76.5 kg    Height:       General:  Appears calm and comfortable and is in NAD Eyes:   EOMI, normal lids, iris ENT:  grossly normal hearing, lips & tongue, mmm; appropriate dentition Neck:  no LAD, masses or thyromegaly Cardiovascular:  RR with mild tachycardia. No LE edema.  Respiratory:   CTA bilaterally with no wheezes/rales/rhonchi.  Mildly increased respiratory effort. Abdomen:  soft, NT, ND Skin:  no rash or induration seen on limited exam Musculoskeletal:  grossly normal tone BUE/BLE, good ROM, no bony abnormality Psychiatric:  mildly anxious mood and affect, speech fluent and appropriate, AOx3 Neurologic:  CN 2-12 grossly intact, moves all extremities in coordinated fashion   Radiological Exams on Admission: Independently reviewed - see discussion in A/P where applicable  CT Angio Chest Pulmonary Embolism (PE) W or WO Contrast  Result Date: 03/01/2023 CLINICAL DATA:  Recurrent chest pain EXAM: CT ANGIOGRAPHY CHEST WITH CONTRAST TECHNIQUE: Multidetector CT imaging of the chest was performed using the standard protocol during bolus administration of intravenous contrast. Multiplanar CT image reconstructions and MIPs were obtained to evaluate the vascular anatomy. RADIATION DOSE REDUCTION: This exam was performed according to the departmental dose-optimization  program which includes automated exposure control, adjustment of the mA and/or kV according to patient size and/or use of iterative reconstruction technique. CONTRAST:  60mL OMNIPAQUE IOHEXOL 350 MG/ML SOLN COMPARISON:  Chest x-ray 02/28/2023 FINDINGS: Cardiovascular: Satisfactory opacification of the pulmonary arteries to the segmental level. No evidence of pulmonary embolism. Normal heart size. No pericardial effusion. Nonaneurysmal aorta. Moderate aortic atherosclerosis. Coronary vascular calcifications. Mediastinum/Nodes: Midline trachea. No thyroid mass. No suspicious  lymph nodes. Esophagus within normal limits aside from small hiatal hernia. Mild distal esophageal thickening. Lungs/Pleura: Lungs are clear. No pleural effusion or pneumothorax. Upper Abdomen: Gallstones. Incompletely visualized left upper quadrant mass measuring 5.2 x 4.4 cm with density values of 24, this appears to be associated with left adrenal gland. Musculoskeletal: No acute osseous abnormality Review of the MIP images confirms the above findings. IMPRESSION: 1. Negative for acute pulmonary embolus or aortic dissection. Clear lung fields. 2. 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 Surgical consultation is recommended. Consider biochemical lab evaluation for functional status and pheochromocytoma prior to resection. 3. Gallstones. 4. Aortic atherosclerosis. Aortic Atherosclerosis (ICD10-I70.0). Electronically Signed   By: Jasmine Pang M.D.   On: 03/01/2023 18:52   ECHOCARDIOGRAM COMPLETE  Result Date: 03/01/2023    ECHOCARDIOGRAM REPORT   Patient Name:   JAVIEN MULLER Date of Exam: 03/01/2023 Medical Rec #:  [id number]     Height:       72.0 in Accession #:    [id number]    Weight:       172.6 lb Date of Birth:  1956/03/04     BSA:          2.001 m Patient Age:    67 years      BP:           132/78 mmHg Patient Gender: M             HR:           93 bpm. Exam Location:  Inpatient Procedure: 2D Echo, Cardiac Doppler and Color Doppler Indications:    Acute ischemic heart disease  History:        Patient has prior history of Echocardiogram examinations. Risk                 Factors:Hypertension.  Sonographer:    Mike Gip Referring Phys: [id number] Donnie Coffin VARANASI IMPRESSIONS  1. Atypical pattern of abnormal wall motion involving the mid segments of the heart with preserved apical contractility. 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 Cannot exclude myocarditis. Left ventricular ejection fraction, by  estimation, is 45 to 50%. The left ventricle has mildly decreased function. The left ventricle demonstrates regional wall motion abnormalities (see scoring diagram/findings for description). Left ventricular diastolic parameters are consistent with Grade I diastolic  dysfunction (impaired relaxation).  2. Right ventricular systolic function is normal. The right ventricular size is normal. Tricuspid regurgitation signal is inadequate for assessing PA pressure.  3. Left atrial size was mildly dilated.  4. The mitral valve is normal in structure. No evidence of mitral valve regurgitation. No evidence of mitral stenosis.  5. The aortic valve is tricuspid. Aortic valve regurgitation is not visualized. No aortic stenosis is present. Comparison(s): Prior images reviewed side by side. The left ventricular function is worsened. The left ventricular diastolic function has improved. The left ventricular wall motion abnormalities are new. FINDINGS  Left Ventricle: Atypical pattern of abnormal wall motion involving  the mid segments of the heart with preserved apical contractility. 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 Cannot exclude myocarditis. Left ventricular ejection fraction, by estimation, is 45 to 50%. The left ventricle has mildly decreased function. The left ventricle demonstrates regional wall motion abnormalities. The left ventricular internal cavity size was normal in size. There is no left ventricular hypertrophy. Left ventricular diastolic parameters are consistent with Grade I diastolic dysfunction (impaired relaxation). Normal left ventricular filling pressure.  LV Wall Scoring: The mid anteroseptal segment, mid inferolateral segment, mid anterolateral segment, mid inferoseptal segment, mid anterior segment, and mid inferior segment are hypokinetic. The entire apex, basal anteroseptal segment, basal inferolateral segment, basal anterolateral segment, basal anterior segment, basal inferior segment,  and basal inferoseptal segment are normal. Right Ventricle: The right ventricular size is normal. No increase in right ventricular wall thickness. Right ventricular systolic function is normal. Tricuspid regurgitation signal is inadequate for assessing PA pressure. Left Atrium: Left atrial size was mildly dilated. Right Atrium: Right atrial size was normal in size. Pericardium: There is no evidence of pericardial effusion. Mitral Valve: The mitral valve is normal in structure. No evidence of mitral valve regurgitation. No evidence of mitral valve stenosis. Tricuspid Valve: The tricuspid valve is normal in structure. Tricuspid valve regurgitation is not demonstrated. Aortic Valve: The aortic valve is tricuspid. Aortic valve regurgitation is not visualized. No aortic stenosis is present. Pulmonic Valve: The pulmonic valve was grossly normal. Pulmonic valve regurgitation is not visualized. Aorta: The aortic root and ascending aorta are structurally normal, with no evidence of dilitation. IAS/Shunts: No atrial level shunt detected by color flow Doppler.  LEFT VENTRICLE PLAX 2D LVIDd:         4.60 cm      Diastology LVIDs:         3.30 cm      LV e' medial:    5.77 cm/s LV PW:         1.00 cm      LV E/e' medial:  8.9 LV IVS:        1.10 cm      LV e' lateral:   8.81 cm/s                             LV E/e' lateral: 5.8  LV Volumes (MOD) LV vol d, MOD A2C: 104.0 ml LV vol d, MOD A4C: 116.0 ml LV vol s, MOD A2C: 51.8 ml LV vol s, MOD A4C: 66.8 ml LV SV MOD A2C:     52.2 ml LV SV MOD A4C:     116.0 ml LV SV MOD BP:      52.6 ml RIGHT VENTRICLE RV Basal diam:  3.80 cm RV S prime:     12.50 cm/s TAPSE (M-mode): 1.8 cm LEFT ATRIUM             Index        RIGHT ATRIUM           Index LA diam:        4.20 cm 2.10 cm/m   RA Area:     11.80 cm LA Vol (A2C):   46.1 ml 23.04 ml/m  RA Volume:   21.70 ml  10.84 ml/m LA Vol (A4C):   59.3 ml 29.63 ml/m LA Biplane Vol: 57.0 ml 28.48 ml/m  AORTIC VALVE LVOT Vmax:   123.00 cm/s  LVOT Vmean:  86.200 cm/s LVOT VTI:    0.208  m  AORTA Ao Asc diam: 3.30 cm MITRAL VALVE MV Area (PHT): 5.64 cm     SHUNTS MV E velocity: 51.40 cm/s   Systemic VTI: 0.21 m MV A velocity: 109.00 cm/s MV E/A ratio:  0.47 Mihai Croitoru MD Electronically signed by Thurmon Fair MD Signature Date/Time: 03/01/2023/4:28:00 PM    Final    CARDIAC CATHETERIZATION  Result Date: 03/01/2023   Mid LAD lesion is 30% stenosed.  Diffuse mild to moderate CAD.   LV end diastolic pressure is normal.   There is no aortic valve stenosis.   Tortuous right subclavian requiring 85 cm destination sheath. Mild to moderate diffuse coronary disease in the major epicardial vessels.  No culprit lesion found.  Patient had a total of 45 minutes of chest pain.  No return of symptoms.  Possibilities include small vessel disease, vasospasm or myocarditis.  Starting heparin 2 hours after TR band removal.  Discussed with the patient's wife, Lanora Manis.   DG Chest Port 1 View  Result Date: 02/28/2023 CLINICAL DATA:  Chest pain EXAM: PORTABLE CHEST 1 VIEW COMPARISON:  None Available. FINDINGS: The heart size and mediastinal contours are within normal limits. Both lungs are clear. The visualized skeletal structures are unremarkable. IMPRESSION: No active disease. Electronically Signed   By: Helyn Numbers M.D.   On: 02/28/2023 21:50      Pertinent Labs: I have personally reviewed the available labs and imaging studies since the time of the admission.   Fasting cortisol 14.9 Glucose 149 BUN 27/Creatinine 1.69/GFR 44 HS troponin 294 -> 1420 Lipids: 90/30/43/86 WBC 15.5 Platelets 426 INR 1.2 A1c 6.3 HIV negative    Family Communication: Wife was present throughout evaluation; I also discussed the adrenal mass with his RN sister by telephone Primary team communication: Discussed with Geoffry Paradise by telephone at the time of the consult  Thank you very much for involving [place] in the care of your patient.  Author: Jonah Blue, MD 03/02/2023 11:16 AM  For on call review [email or web address].

## 2023-03-02 NOTE — Assessment & Plan Note (Signed)
-  Continue tamsulosin -BPH symptoms may be simply related to BPH but could also be related to underlying adrenal issue - testing ordered

## 2023-03-02 NOTE — Assessment & Plan Note (Signed)
-  Continue atorvastatin, Zetia -LDL is excellent although HDL is low

## 2023-03-02 NOTE — Assessment & Plan Note (Signed)
-  Patient presenting with CP, indigestion, flushing -Troponin significantly uptrending -Cath negative for significant CAD -Management per cardiology

## 2023-03-02 NOTE — Assessment & Plan Note (Signed)
-  Echo with EF 45-50% and grade 1 diastolic dysfunction -Echo was concerning for takotsubo cardiomyopathy vs. Myocarditis -He is scheduled for cardiac MRI per cardiology

## 2023-03-02 NOTE — Assessment & Plan Note (Addendum)
-  Incidental finding of 5 cm LUQ mass which appears to be adrenal in nature -Extensive labs have been ordered including cortisol (normal), ACTH, DHEA, androstinedione, testosterone, progesterone, and estradiol -Metanephrines and renin-aldosterone testing has also been ordered -MRI has been ordered but will not be performed until tomorrow since contrast administration is different for cardiac vs. Abdominal MRI -I have also reached out to Dr. Debara Pickett to request telephone consult to see if additional testing would be beneficial at this time -Based on MRI results, he will likely need IR-directed biopsy and/or surgery consult for possible resection

## 2023-03-02 NOTE — Assessment & Plan Note (Signed)
-  Chronic leukocytosis since at least 2009 -Further evaluation based on testing already ordered -may need hem/onc evaluation

## 2023-03-02 NOTE — Assessment & Plan Note (Signed)
-  A1c 6.3 -Needs diet/exercise (already does this regularly, walks 100 miles/month) -no medications are needed at this time

## 2023-03-02 NOTE — Progress Notes (Signed)
ANTICOAGULATION CONSULT NOTE  Pharmacy Consult for heparin Indication: chest pain/ACS  No Known Allergies  Patient Measurements: Height: 6' (182.9 cm) Weight: 78.3 kg (172 lb 9.6 oz) IBW/kg (Calculated) : 77.6 Heparin Dosing Weight: 81kg  Vital Signs: Temp: 98.7 F (37.1 C) (05/22 0112) Temp Source: Oral (05/22 0112) BP: 140/83 (05/22 0112) Pulse Rate: 117 (05/22 0112)  Labs: Recent Labs    02/28/23 2115 02/28/23 2134 02/28/23 2335 03/01/23 0124 03/02/23 0105  HGB 14.7 14.6  --  13.9  --   HCT 44.1 43.0  --  41.7  --   PLT 444*  --   --  426*  --   LABPROT  --   --   --  15.2  --   INR  --   --   --  1.2  --   HEPARINUNFRC  --   --   --  0.22* 0.17*  CREATININE 1.61* 1.50*  --  1.69*  --   TROPONINIHS 294*  --  1,420*  --   --      Estimated Creatinine Clearance: 45.9 mL/min (A) (by C-G formula based on SCr of 1.69 mg/dL (H)).   Medical History: Past Medical History:  Diagnosis Date   Hypertension     Assessment: 74 YOM presenting with CP, elevated troponin, he is not on anticoagulation PTA, CBC wnl. S/p LHC on 5/21. Pharmacy consulted to resume IV heparin 2 hours after TR band is removed. TR band was removed ~1600.  5/22 AM update:  Heparin level low after re-start s/p cath  Goal of Therapy:  Heparin level 0.3-0.7 units/ml Monitor platelets by anticoagulation protocol: Yes   Plan:  Inc heparin to 1500 units/hr 1100 heparin level  Abran Duke, PharmD, BCPS Clinical Pharmacist Phone: [phone]

## 2023-03-02 NOTE — Progress Notes (Addendum)
Rounding Note    Patient Name: Shane Sampson Date of Encounter: 03/02/2023  [facility] Cardiologist: Little Ishikawa, MD   Subjective   No chest pain this morning. Family at the bedside.   Inpatient Medications    Scheduled Meds:  amLODipine  10 mg Oral Daily   aspirin EC  81 mg Oral Daily   atorvastatin  80 mg Oral Daily   carvedilol  12.5 mg Oral BID WC   [START ON 03/03/2023] cosyntropin  0.25 mg Intravenous Once   enoxaparin (LOVENOX) injection  40 mg Subcutaneous QHS   ezetimibe  10 mg Oral Daily   hydrALAZINE  50 mg Oral BID   sodium chloride flush  3 mL Intravenous Q12H   tamsulosin  0.4 mg Oral Daily   Continuous Infusions:  sodium chloride     PRN Meds: sodium chloride, acetaminophen, acetaminophen, nitroGLYCERIN, ondansetron (ZOFRAN) IV, ondansetron (ZOFRAN) IV, sodium chloride flush   Vital Signs    Vitals:   03/01/23 2300 03/02/23 0112 03/02/23 0500 03/02/23 0700  BP: 117/67 (!) 140/83  131/88  Pulse: (!) 126 (!) 117  93  Resp: (!) 22 (!) 22  18  Temp:  98.7 F (37.1 C)  98.4 F (36.9 C)  TempSrc:  Oral  Oral  SpO2: 91% 94%  93%  Weight:   76.5 kg   Height:        Intake/Output Summary (Last 24 hours) at 03/02/2023 0954 Last data filed at 03/02/2023 0912 Gross per 24 hour  Intake 870.95 ml  Output 2700 ml  Net -1829.05 ml      03/02/2023    5:00 AM 03/01/2023    2:16 PM 02/28/2023   10:48 PM  Last 3 Weights  Weight (lbs) 168 lb 11.2 oz 172 lb 9.6 oz 175 lb  Weight (kg) 76.522 kg 78.291 kg 79.379 kg      Telemetry    Sinus Tachycardia, episode into the 140-150 range last evening, improved this morning around 100 - Personally Reviewed  ECG    No new tracing  Physical Exam   GEN: No acute distress.   Neck: No JVD Cardiac: RRR, no murmurs, rubs, or gallops.  Respiratory: Clear to auscultation bilaterally. GI: Soft, nontender, non-distended  MS: No edema; No deformity. Right radial cath site stable.  Neuro:   Nonfocal  Psych: Normal affect   Labs    High Sensitivity Troponin:   Recent Labs  Lab 02/28/23 2115 02/28/23 2335  TROPONINIHS 294* 1,420*     Chemistry Recent Labs  Lab 02/28/23 2115 02/28/23 2134 03/01/23 0124  NA 138 138 138  K 4.6 4.5 4.1  CL 104 103 104  CO2 23  --  23  GLUCOSE 244* 235* 149*  BUN 25* 26* 27*  CREATININE 1.61* 1.50* 1.69*  CALCIUM 9.5  --  9.3  PROT  --   --  6.5  ALBUMIN  --   --  3.7  AST  --   --  22  ALT  --   --  24  ALKPHOS  --   --  74  BILITOT  --   --  1.2  GFRNONAA 46*  --  44*  ANIONGAP 11  --  11    Lipids  Recent Labs  Lab 03/01/23 0124  CHOL 90  TRIG 86  HDL 30*  LDLCALC 43  CHOLHDL 3.0    Hematology Recent Labs  Lab 02/28/23 2115 02/28/23 2134 03/01/23 0124  WBC 16.5*  --  15.5*  RBC 4.94  --  4.64  HGB 14.7 14.6 13.9  HCT 44.1 43.0 41.7  MCV 89.3  --  89.9  MCH 29.8  --  30.0  MCHC 33.3  --  33.3  RDW 13.2  --  13.4  PLT 444*  --  426*   Thyroid No results for input(s): "TSH", "FREET4" in the last 168 hours.  BNPNo results for input(s): "BNP", "PROBNP" in the last 168 hours.  DDimer  Recent Labs  Lab 03/01/23 1806  DDIMER <0.27     Radiology    CT Angio Chest Pulmonary Embolism (PE) W or WO Contrast  Result Date: 03/01/2023 CLINICAL DATA:  Recurrent chest pain EXAM: CT ANGIOGRAPHY CHEST WITH CONTRAST TECHNIQUE: Multidetector CT imaging of the chest was performed using the standard protocol during bolus administration of intravenous contrast. Multiplanar CT image reconstructions and MIPs were obtained to evaluate the vascular anatomy. RADIATION DOSE REDUCTION: This exam was performed according to the departmental dose-optimization program which includes automated exposure control, adjustment of the mA and/or kV according to patient size and/or use of iterative reconstruction technique. CONTRAST:  60mL OMNIPAQUE IOHEXOL 350 MG/ML SOLN COMPARISON:  Chest x-ray 02/28/2023 FINDINGS: Cardiovascular:  Satisfactory opacification of the pulmonary arteries to the segmental level. No evidence of pulmonary embolism. Normal heart size. No pericardial effusion. Nonaneurysmal aorta. Moderate aortic atherosclerosis. Coronary vascular calcifications. Mediastinum/Nodes: Midline trachea. No thyroid mass. No suspicious lymph nodes. Esophagus within normal limits aside from small hiatal hernia. Mild distal esophageal thickening. Lungs/Pleura: Lungs are clear. No pleural effusion or pneumothorax. Upper Abdomen: Gallstones. Incompletely visualized left upper quadrant mass measuring 5.2 x 4.4 cm with density values of 24, this appears to be associated with left adrenal gland. Musculoskeletal: No acute osseous abnormality Review of the MIP images confirms the above findings. IMPRESSION: 1. Negative for acute pulmonary embolus or aortic dissection. Clear lung fields. 2. 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 Surgical consultation is recommended. Consider biochemical lab evaluation for functional status and pheochromocytoma prior to resection. 3. Gallstones. 4. Aortic atherosclerosis. Aortic Atherosclerosis (ICD10-I70.0). Electronically Signed   By: Jasmine Pang M.D.   On: 03/01/2023 18:52   ECHOCARDIOGRAM COMPLETE  Result Date: 03/01/2023    ECHOCARDIOGRAM REPORT   Patient Name:   Shane Sampson Date of Exam: 03/01/2023 Medical Rec #:  [id number]     Height:       72.0 in Accession #:    [id number]    Weight:       172.6 lb Date of Birth:  07-04-1956     BSA:          2.001 m Patient Age:    67 years      BP:           132/78 mmHg Patient Gender: M             HR:           93 bpm. Exam Location:  Inpatient Procedure: 2D Echo, Cardiac Doppler and Color Doppler Indications:    Acute ischemic heart disease  History:        Patient has prior history of Echocardiogram examinations. Risk                 Factors:Hypertension.  Sonographer:     Mike Gip Referring Phys: [id number] Donnie Coffin VARANASI IMPRESSIONS  1. Atypical pattern of abnormal wall motion involving the mid segments of  the heart with preserved apical contractility. 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 Cannot exclude myocarditis. Left ventricular ejection fraction, by estimation, is 45 to 50%. The left ventricle has mildly decreased function. The left ventricle demonstrates regional wall motion abnormalities (see scoring diagram/findings for description). Left ventricular diastolic parameters are consistent with Grade I diastolic  dysfunction (impaired relaxation).  2. Right ventricular systolic function is normal. The right ventricular size is normal. Tricuspid regurgitation signal is inadequate for assessing PA pressure.  3. Left atrial size was mildly dilated.  4. The mitral valve is normal in structure. No evidence of mitral valve regurgitation. No evidence of mitral stenosis.  5. The aortic valve is tricuspid. Aortic valve regurgitation is not visualized. No aortic stenosis is present. Comparison(s): Prior images reviewed side by side. The left ventricular function is worsened. The left ventricular diastolic function has improved. The left ventricular wall motion abnormalities are new. FINDINGS  Left Ventricle: Atypical pattern of abnormal wall motion involving the mid segments of the heart with preserved apical contractility. 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 Cannot exclude myocarditis. Left ventricular ejection fraction, by estimation, is 45 to 50%. The left ventricle has mildly decreased function. The left ventricle demonstrates regional wall motion abnormalities. The left ventricular internal cavity size was normal in size. There is no left ventricular hypertrophy. Left ventricular diastolic parameters are consistent with Grade I diastolic dysfunction (impaired relaxation). Normal left ventricular filling pressure.  LV Wall Scoring: The mid  anteroseptal segment, mid inferolateral segment, mid anterolateral segment, mid inferoseptal segment, mid anterior segment, and mid inferior segment are hypokinetic. The entire apex, basal anteroseptal segment, basal inferolateral segment, basal anterolateral segment, basal anterior segment, basal inferior segment, and basal inferoseptal segment are normal. Right Ventricle: The right ventricular size is normal. No increase in right ventricular wall thickness. Right ventricular systolic function is normal. Tricuspid regurgitation signal is inadequate for assessing PA pressure. Left Atrium: Left atrial size was mildly dilated. Right Atrium: Right atrial size was normal in size. Pericardium: There is no evidence of pericardial effusion. Mitral Valve: The mitral valve is normal in structure. No evidence of mitral valve regurgitation. No evidence of mitral valve stenosis. Tricuspid Valve: The tricuspid valve is normal in structure. Tricuspid valve regurgitation is not demonstrated. Aortic Valve: The aortic valve is tricuspid. Aortic valve regurgitation is not visualized. No aortic stenosis is present. Pulmonic Valve: The pulmonic valve was grossly normal. Pulmonic valve regurgitation is not visualized. Aorta: The aortic root and ascending aorta are structurally normal, with no evidence of dilitation. IAS/Shunts: No atrial level shunt detected by color flow Doppler.  LEFT VENTRICLE PLAX 2D LVIDd:         4.60 cm      Diastology LVIDs:         3.30 cm      LV e' medial:    5.77 cm/s LV PW:         1.00 cm      LV E/e' medial:  8.9 LV IVS:        1.10 cm      LV e' lateral:   8.81 cm/s                             LV E/e' lateral: 5.8  LV Volumes (MOD) LV vol d, MOD A2C: 104.0 ml LV vol d, MOD A4C: 116.0 ml LV vol s, MOD A2C: 51.8 ml LV vol s, MOD  A4C: 66.8 ml LV SV MOD A2C:     52.2 ml LV SV MOD A4C:     116.0 ml LV SV MOD BP:      52.6 ml RIGHT VENTRICLE RV Basal diam:  3.80 cm RV S prime:     12.50 cm/s TAPSE (M-mode):  1.8 cm LEFT ATRIUM             Index        RIGHT ATRIUM           Index LA diam:        4.20 cm 2.10 cm/m   RA Area:     11.80 cm LA Vol (A2C):   46.1 ml 23.04 ml/m  RA Volume:   21.70 ml  10.84 ml/m LA Vol (A4C):   59.3 ml 29.63 ml/m LA Biplane Vol: 57.0 ml 28.48 ml/m  AORTIC VALVE LVOT Vmax:   123.00 cm/s LVOT Vmean:  86.200 cm/s LVOT VTI:    0.208 m  AORTA Ao Asc diam: 3.30 cm MITRAL VALVE MV Area (PHT): 5.64 cm     SHUNTS MV E velocity: 51.40 cm/s   Systemic VTI: 0.21 m MV A velocity: 109.00 cm/s MV E/A ratio:  0.47 Mihai Croitoru MD Electronically signed by Thurmon Fair MD Signature Date/Time: 03/01/2023/4:28:00 PM    Final    CARDIAC CATHETERIZATION  Result Date: 03/01/2023   Mid LAD lesion is 30% stenosed.  Diffuse mild to moderate CAD.   LV end diastolic pressure is normal.   There is no aortic valve stenosis.   Tortuous right subclavian requiring 85 cm destination sheath. Mild to moderate diffuse coronary disease in the major epicardial vessels.  No culprit lesion found.  Patient had a total of 45 minutes of chest pain.  No return of symptoms.  Possibilities include small vessel disease, vasospasm or myocarditis.  Starting heparin 2 hours after TR band removal.  Discussed with the patient's wife, Lanora Manis.   DG Chest Port 1 View  Result Date: 02/28/2023 CLINICAL DATA:  Chest pain EXAM: PORTABLE CHEST 1 VIEW COMPARISON:  None Available. FINDINGS: The heart size and mediastinal contours are within normal limits. Both lungs are clear. The visualized skeletal structures are unremarkable. IMPRESSION: No active disease. Electronically Signed   By: Helyn Numbers M.D.   On: 02/28/2023 21:50    Cardiac Studies   Cath: 03/01/2023    Mid LAD lesion is 30% stenosed.  Diffuse mild to moderate CAD.   LV end diastolic pressure is normal.   There is no aortic valve stenosis.   Tortuous right subclavian requiring 85 cm destination sheath.   Mild to moderate diffuse coronary disease in the major  epicardial vessels.  No culprit lesion found.  Patient had a total of 45 minutes of chest pain.  No return of symptoms.  Possibilities include small vessel disease, vasospasm or myocarditis.  Starting heparin 2 hours after TR band removal.  Discussed with the patient's wife, Lanora Manis.   Echo: 03/01/2023  IMPRESSIONS     1. Atypical pattern of abnormal wall motion involving the mid segments of  the heart with preserved apical contractility. Suspect a mid cavity  variant of takotsubo syndrome. Cannot exclude myocarditis. Left  ventricular ejection fraction, by estimation,  is 45 to 50%. The left ventricle has mildly decreased function. The left  ventricle demonstrates regional wall motion abnormalities (see scoring  diagram/findings for description). Left ventricular diastolic parameters  are consistent with Grade I diastolic   dysfunction (impaired relaxation).  2. Right ventricular systolic function is normal. The right ventricular  size is normal. Tricuspid regurgitation signal is inadequate for assessing  PA pressure.   3. Left atrial size was mildly dilated.   4. The mitral valve is normal in structure. No evidence of mitral valve  regurgitation. No evidence of mitral stenosis.   5. The aortic valve is tricuspid. Aortic valve regurgitation is not  visualized. No aortic stenosis is present.   Comparison(s): Prior images reviewed side by side. The left ventricular  function is worsened. The left ventricular diastolic function has  improved. The left ventricular wall motion abnormalities are new.   FINDINGS   Left Ventricle: Atypical pattern of abnormal wall motion involving the  mid segments of the heart with preserved apical contractility. Suspect a  mid cavity variant of takotsubo syndrome. Cannot exclude myocarditis. Left  ventricular ejection fraction, by  estimation, is 45 to 50%. The left ventricle has mildly decreased  function. The left ventricle demonstrates regional  wall motion  abnormalities. The left ventricular internal cavity size was normal in  size. There is no left ventricular hypertrophy. Left  ventricular diastolic parameters are consistent with Grade I diastolic  dysfunction (impaired relaxation). Normal left ventricular filling  pressure.     LV Wall Scoring:  The mid anteroseptal segment, mid inferolateral segment, mid anterolateral  segment, mid inferoseptal segment, mid anterior segment, and mid inferior  segment are hypokinetic. The entire apex, basal anteroseptal segment,  basal  inferolateral segment, basal anterolateral segment, basal anterior  segment,  basal inferior segment, and basal inferoseptal segment are normal.   Right Ventricle: The right ventricular size is normal. No increase in  right ventricular wall thickness. Right ventricular systolic function is  normal. Tricuspid regurgitation signal is inadequate for assessing PA  pressure.   Left Atrium: Left atrial size was mildly dilated.   Right Atrium: Right atrial size was normal in size.   Pericardium: There is no evidence of pericardial effusion.   Mitral Valve: The mitral valve is normal in structure. No evidence of  mitral valve regurgitation. No evidence of mitral valve stenosis.   Tricuspid Valve: The tricuspid valve is normal in structure. Tricuspid  valve regurgitation is not demonstrated.   Aortic Valve: The aortic valve is tricuspid. Aortic valve regurgitation is  not visualized. No aortic stenosis is present.   Pulmonic Valve: The pulmonic valve was grossly normal. Pulmonic valve  regurgitation is not visualized.   Aorta: The aortic root and ascending aorta are structurally normal, with  no evidence of dilitation.   IAS/Shunts: No atrial level shunt detected by color flow Doppler.       Patient Profile     67 y.o. male with PMH of HTN, HLD and BPH who presented with chest pain and elevated troponin   Assessment & Plan    NSTEMI Chest  pain -- presented to ED with chest pain and shortness of breath after playing golf. hsTn 294>>1420. Received SL nitro with improved symptoms. Underwent cardiac cath noted above with no significant CAD, or culprit lesion noted. 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  -- plan for cardiac MRI today to further differentiate  -- continue IV heparin (total of 48 hrs), ASA, statin, coreg    HTN -- initially hypertensive on admission then improved. Developed refractory HTN yesterday afternoon with episodes of sinus tachycardia. Improved with IV pain meds. IV nitro was weaned and DC'ed -- increase coreg to 25mg   BID, continue hydralazine 50mg  BID, norvasc 10mg  -- will hold ACE for now with elevated Cr   HLD -- LDL 43, HDL 30 -- on atorvastatin 40mg  PTA, increased to 80mg   -- continue Zetia 10mg     CKD III -- baseline Cr around 1.5, up to 1.69 yesterday. 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%, g1DD normal RV -- GDMT: continue coreg 25mg  BID, can consider stopping amlodipine and transition to ARB once Cr improves   Adrenal Mass Sinus Tachycardia Labile  HTN -- noted on CT angio, incompletely visualized 5.2 cm left upper quadrant mass which appears to be associated with left adrenal gland, possible malignancy -- work up for pheochromocytoma with metanephrines, aldosterone, renin pending  -- will consult medicine for further assistance    For questions or updates, please contact [facility] Please consult [email or web address] for contact info under        Signed, Laverda Page, NP  03/02/2023, 9:54 AM    Agree with note by Laverda Page NP-C  Patient admitted with non-STEMI.  His troponins went up to 1420.  Cardiac cath showed no significant CAD/culprit lesion.  2D echo did show a decline in his EF to 45 to 50% with a high anterolateral wall motion abnormality consistent  with either atypical Takotsubo syndrome or myocarditis.  He is currently pain-free.  In addition, incidental finding on CT was a 5 cm left upper quadrant mass potentially associate with his adrenal gland.  Medicine will be consulted and potentially surgery.  GDMT has been initiated.  Carvedilol will be titrated.  Will hold off on starting an ACE/ARB or Entresto given his renal insufficiency, waiting for this to return to baseline.  Will continue heparin for 48 hours and then discontinue.   Runell Gess, M.D., FACP, [facility], [facility] [address] [place], [place]  [address]  [phone] 03/02/2023 10:04 AM

## 2023-03-02 NOTE — Progress Notes (Signed)
ANTICOAGULATION CONSULT NOTE  Pharmacy Consult for heparin Indication: chest pain/ACS  No Known Allergies  Patient Measurements: Height: 6' (182.9 cm) Weight: 76.5 kg (168 lb 11.2 oz) IBW/kg (Calculated) : 77.6 Heparin Dosing Weight: 81kg  Vital Signs: Temp: 98.4 F (36.9 C) (05/22 0700) Temp Source: Oral (05/22 0700) BP: 131/88 (05/22 0700) Pulse Rate: 93 (05/22 0700)  Labs: Recent Labs    02/28/23 2115 02/28/23 2134 02/28/23 2335 03/01/23 0124 03/02/23 0105  HGB 14.7 14.6  --  13.9  --   HCT 44.1 43.0  --  41.7  --   PLT 444*  --   --  426*  --   LABPROT  --   --   --  15.2  --   INR  --   --   --  1.2  --   HEPARINUNFRC  --   --   --  0.22* 0.17*  CREATININE 1.61* 1.50*  --  1.69*  --   TROPONINIHS 294*  --  1,420*  --   --      Estimated Creatinine Clearance: 45.3 mL/min (A) (by C-G formula based on SCr of 1.69 mg/dL (H)).   Medical History: Past Medical History:  Diagnosis Date   Hypertension     Assessment: 52 YOM presenting with CP, elevated troponin, he is not on anticoagulation PTA, CBC wnl. S/p LHC on 5/21. Pharmacy consulted to resume IV heparin 2 hours after TR band is removed.   Per cardiology, plans are for 48 hrs heparin (started 5/20 in pm).    Goal of Therapy:  Heparin level 0.3-0.7 units/ml Monitor platelets by anticoagulation protocol: Yes   Plan:  -Continue heparin at 1500 units/hr then stop tonight -No further heparin levels since heparin stops later today  Harland German, PharmD Clinical Pharmacist **Pharmacist phone directory can now be found on [email or web address] ([email or web address]).  Listed under [facility].

## 2023-03-02 NOTE — Assessment & Plan Note (Signed)
-  Appears to be stable at this time -Attempt to avoid nephrotoxic medications - including ACE/ARB -Recheck BMP in AM

## 2023-03-02 NOTE — Assessment & Plan Note (Addendum)
-  He is on multidrug therapy at baseline - amlodipine, carvedilol, enalapril, hydralazine -His episodes led to elevated BP but this could be an acute phase response or could be suggestive of underlying secondary issue like pheochromocytoma (in the setting of apparent adrenal mass) -Continue home meds for now (enalapril is on hold) -Undergoing further adrenal evaluation

## 2023-03-02 NOTE — TOC CM/SW Note (Signed)
Transition of Care [facility]) - Inpatient Brief Assessment   Patient Details  Name: Shane Sampson MRN: [id number] Date of Birth: 11-22-1955  Transition of Care [facility]) CM/SW Contact:    Leone Haven, RN Phone Number: 03/02/2023, 5:31 PM   Clinical Narrative: From home with wife, indep, NSTEMI, hep drip, s/p cath yesterday. Please consult TOC for any additional needs.    Transition of Care Asessment: Insurance and Status: Insurance coverage has been reviewed Patient has primary care physician: Yes Home environment has been reviewed: home with wife Prior level of function:: indep Prior/Current Home Services: No current home services Social Determinants of Health Reivew: SDOH reviewed no interventions necessary Readmission risk has been reviewed: Yes Transition of care needs: no transition of care needs at this time

## 2023-03-03 ENCOUNTER — Telehealth (HOSPITAL_COMMUNITY): Payer: Self-pay | Admitting: Pharmacy Technician

## 2023-03-03 ENCOUNTER — Inpatient Hospital Stay (HOSPITAL_COMMUNITY): Payer: 59

## 2023-03-03 ENCOUNTER — Other Ambulatory Visit (HOSPITAL_COMMUNITY): Payer: Self-pay

## 2023-03-03 DIAGNOSIS — I5181 Takotsubo syndrome: Secondary | ICD-10-CM | POA: Diagnosis not present

## 2023-03-03 DIAGNOSIS — I214 Non-ST elevation (NSTEMI) myocardial infarction: Secondary | ICD-10-CM | POA: Diagnosis not present

## 2023-03-03 LAB — BASIC METABOLIC PANEL
Anion gap: 12 (ref 5–15)
BUN: 19 mg/dL (ref 8–23)
CO2: 22 mmol/L (ref 22–32)
Calcium: 9 mg/dL (ref 8.9–10.3)
Chloride: 101 mmol/L (ref 98–111)
Creatinine, Ser: 1.21 mg/dL (ref 0.61–1.24)
GFR, Estimated: >60 mL/min (ref >60)
Glucose, Bld: 141 mg/dL — ABNORMAL HIGH (ref 70–99)
Potassium: 4 mmol/L (ref 3.5–5.1)
Sodium: 135 mmol/L (ref 135–145)

## 2023-03-03 LAB — ESTRADIOL: Estradiol: 43.6 pg/mL — ABNORMAL HIGH (ref 7.6–42.6)

## 2023-03-03 LAB — PROGESTERONE: Progesterone: 0.1 ng/mL (ref 0.0–0.5)

## 2023-03-03 LAB — DHEA-SULFATE: DHEA-SO4: 107 ug/dL (ref 30.9–295.6)

## 2023-03-03 LAB — ACTH STIMULATION, 3 TIME POINTS
Cortisol, 30 Min: 29.3 ug/dL
Cortisol, 60 Min: 28.2 ug/dL
Cortisol, Base: 13.4 ug/dL

## 2023-03-03 LAB — LIPOPROTEIN A (LPA): Lipoprotein (a): 164.7 nmol/L — ABNORMAL HIGH (ref <75.0)

## 2023-03-03 LAB — ACTH: C206 ACTH: 10.4 pg/mL (ref 7.2–63.3)

## 2023-03-03 LAB — TESTOSTERONE: Testosterone: 309 ng/dL (ref 264–916)

## 2023-03-03 MED ORDER — SPIRONOLACTONE 12.5 MG HALF TABLET
12.5000 mg | ORAL_TABLET | Freq: Every day | ORAL | Status: DC
  Administered 2023-03-03 – 2023-03-04 (×2): 12.5 mg via ORAL
  Filled 2023-03-03 (×2): qty 1

## 2023-03-03 MED ORDER — SACUBITRIL-VALSARTAN 24-26 MG PO TABS
1.0000 | ORAL_TABLET | Freq: Two times a day (BID) | ORAL | Status: DC
  Administered 2023-03-03 – 2023-03-04 (×3): 1 via ORAL
  Filled 2023-03-03 (×3): qty 1

## 2023-03-03 MED ORDER — EMPAGLIFLOZIN 10 MG PO TABS
10.0000 mg | ORAL_TABLET | Freq: Every day | ORAL | Status: DC
  Administered 2023-03-03 – 2023-03-04 (×2): 10 mg via ORAL
  Filled 2023-03-03 (×2): qty 1

## 2023-03-03 MED ORDER — GADOBUTROL 1 MMOL/ML IV SOLN
7.5000 mL | Freq: Once | INTRAVENOUS | Status: AC | PRN
  Administered 2023-03-03: 7.5 mL via INTRAVENOUS

## 2023-03-03 NOTE — TOC Benefit Eligibility Note (Signed)
Patient Product/process development scientist completed.    The patient is currently admitted and upon discharge could be taking Entresto 24-26 mg.  The current 30 day co-pay is $661.40 due to a $2000 deductible.   The patient is currently admitted and upon discharge could be taking Farxiga 10 mg.  Requires Prior Authorization  The patient is currently admitted and upon discharge could be taking Jardiance 10 mg.  Requires Prior Authorization  The patient is insured through [employer]   This test claim was processed through [facility]- copay amounts may vary at other pharmacies due to pharmacy/plan contracts, or as the patient moves through the different stages of their insurance plan.  Roland Earl, CPHT Pharmacy Patient Advocate Specialist [facility] Patient Advocate Team Direct Number: [phone]  Fax: [phone]

## 2023-03-03 NOTE — Telephone Encounter (Signed)
Pharmacy Patient Advocate Encounter  Insurance verification completed.    The patient is insured through [employer]   The patient is currently admitted and ran test claims for the following: Shane Sampson, Jardiance.  Copays and coinsurance results were relayed to Inpatient clinical team.

## 2023-03-03 NOTE — Progress Notes (Signed)
PROGRESS NOTE    Shane HANNINEN  ZOX:[id number] DOB: October 27, 1955 DOA: 02/28/2023 PCP: Rodrigo Ran, MD    Brief Narrative:  67 year old gentleman with history of hypertension, hyperlipidemia, BPH admitted on 5/21 with chest pain and elevated troponins.  He underwent cardiac cath that was negative.  Suspected Takotsubo.  MRI consistent with Takotsubo cardiomyopathy.  Currently stabilizing on treatment. CT angiogram of the chest also noted with mass on the left and no gland, biopsy pending.  Serological test pending.   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 Hormonal ASA including Cortisol, normal.  ACTH stimulation test with normal findings. Estradiol, within normal limits Progesterone, normal Testosterone, normal metanephrine and renin aldosterone testing pending. After MRI findings, we can likely discharge him home and make referral to endocrine and surgery.  Will discuss with surgery once we have MRI findings.  Non-STEMI/Takotsubo cardiomyopathy: Presented with chest pain, indigestion and flushing.  Troponin significantly elevated.  Cardiac cath negative for coronary artery disease.  Management as per cardiology.  Leukocytosis: Chronic. BPH, on tamsulosin Dyslipidemia, on atorvastatin and Zetia Pre diabetes,  diet exercise discussed.  Going home on [place]. CKD stage IIIa, at about baseline. Essential hypertension, blood pressure stable on amlodipine, carvedilol, enalapril and hydralazine.  Thank you for involving [place] in this patient's care.  Will continue to follow until patient is in the hospital.  Will follow MRI and initiate appropriate referral.   DVT prophylaxis: SCDs   Code Status: Full code Family Communication: None at the bedside Disposition Plan: Status is: Inpatient Remains inpatient appropriate because: Management of chest pain, inpatient investigations     Consultants:  TRH  Procedures:   Cardiac cath  Antimicrobials:  None   Subjective: Patient seen in the morning rounds.  Denies any overnight events.  Patient tells me for last 24 hours.  Did not have any episodes of chest pain or discomfort.  Moving around.  Objective: Vitals:   03/02/23 2348 03/03/23 0325 03/03/23 0752 03/03/23 0909  BP: 122/86 (!) 131/94 136/86 128/87  Pulse: (!) 103 100 95   Resp: 20 20 15 19   Temp: 98.8 F (37.1 C) 98 F (36.7 C) 98 F (36.7 C)   TempSrc: Oral Oral Oral   SpO2: 96% 96% 95%   Weight:  75.2 kg    Height:        Intake/Output Summary (Last 24 hours) at 03/03/2023 1118 Last data filed at 03/03/2023 0752 Gross per 24 hour  Intake 2032 ml  Output 1800 ml  Net 232 ml   Filed Weights   03/01/23 1416 03/02/23 0500 03/03/23 0325  Weight: 78.3 kg 76.5 kg 75.2 kg    Examination:  General exam: Appears calm and comfortable  Respiratory system: Clear to auscultation. Respiratory effort normal. Cardiovascular system: S1 & S2 heard, RRR. No JVD, murmurs, rubs, gallops or clicks. No pedal edema. Gastrointestinal system: Abdomen is nondistended, soft and nontender. No organomegaly or masses felt. Normal bowel sounds heard. Central nervous system: Alert and oriented. No focal neurological deficits. Extremities: Symmetric 5 x 5 power. Skin: No rashes, lesions or ulcers Psychiatry: Judgement and insight appear normal. Mood & affect appropriate.     Data Reviewed: I have personally reviewed following labs and imaging studies  CBC: Recent Labs  Lab 02/28/23 2115 02/28/23 2134 03/01/23 0124 03/02/23 1040  WBC 16.5*  --  15.5* 17.1*  HGB 14.7 14.6 13.9 15.5  HCT 44.1 43.0 41.7 45.0  MCV 89.3  --  89.9 87.7  PLT 444*  --  426* 388   Basic Metabolic Panel: Recent Labs  Lab 02/28/23 2115 02/28/23 2134 03/01/23 0124 03/02/23 1040 03/03/23 0556  NA 138 138 138 136 135  K 4.6 4.5 4.1 3.9 4.0  CL 104 103 104 101 101  CO2 23  --  23 24 22   GLUCOSE 244* 235* 149* 178*  141*  BUN 25* 26* 27* 15 19  CREATININE 1.61* 1.50* 1.69* 1.15 1.21  CALCIUM 9.5  --  9.3 9.2 9.0   GFR: Estimated Creatinine Clearance: 62.1 mL/min (by C-G formula based on SCr of 1.21 mg/dL). Liver Function Tests: Recent Labs  Lab 03/01/23 0124  AST 22  ALT 24  ALKPHOS 74  BILITOT 1.2  PROT 6.5  ALBUMIN 3.7   No results for input(s): "LIPASE", "AMYLASE" in the last 168 hours. No results for input(s): "AMMONIA" in the last 168 hours. Coagulation Profile: Recent Labs  Lab 03/01/23 0124  INR 1.2   Cardiac Enzymes: No results for input(s): "CKTOTAL", "CKMB", "CKMBINDEX", "TROPONINI" in the last 168 hours. BNP (last 3 results) No results for input(s): "PROBNP" in the last 8760 hours. HbA1C: Recent Labs    03/01/23 0124  HGBA1C 6.3*   CBG: No results for input(s): "GLUCAP" in the last 168 hours. Lipid Profile: Recent Labs    03/01/23 0124  CHOL 90  HDL 30*  LDLCALC 43  TRIG 86  CHOLHDL 3.0   Thyroid Function Tests: No results for input(s): "TSH", "T4TOTAL", "FREET4", "T3FREE", "THYROIDAB" in the last 72 hours. Anemia Panel: No results for input(s): "VITAMINB12", "FOLATE", "FERRITIN", "TIBC", "IRON", "RETICCTPCT" in the last 72 hours. Sepsis Labs: No results for input(s): "PROCALCITON", "LATICACIDVEN" in the last 168 hours.  No results found for this or any previous visit (from the past 240 hour(s)).       Radiology Studies: MR CARDIAC MORPHOLOGY W WO CONTRAST  Result Date: 03/02/2023 CLINICAL DATA:  83M p/w chest pain, troponin up to 1420. Cath shows nonobstructive CAD. Echo with EF 45-50%, with hypokinesis of mid segments EXAM: CARDIAC MRI TECHNIQUE: The patient was scanned on a 1.5 Tesla Siemens magnet. A dedicated cardiac coil was used. Functional imaging was done using Fiesta sequences. 2,3, and 4 chamber views were done to assess for RWMA's. Modified Simpson's rule using a short axis stack was used to calculate an ejection fraction on a dedicated work  [employer], [employer]. The patient received 10 cc of Gadavist. After 10 minutes inversion recovery sequences were used to assess for infiltration and scar tissue. Phase contrast velocity mapping was performed above the aortic and pulmonic valves CONTRAST:  10 cc  of Gadavist FINDINGS: Left ventricle: -Normal size -Asymmetric hypertrophy measuring 17mm in basal septum (8mm in posterior wall) -Moderate systolic dysfunction.  Hypokinesis of mid segments -Elevated ECV (30%).  Normal T2 values -RV insertion site LGE. LGE accounts for 2% of total myocardial mass LV EF: 34% (Normal 49-79%) Absolute volumes: LV EDV: (Normal 95-215 mL) LV ESV: (Normal 25-85 mL) LV SV: 60mL (Normal 61-145 mL) CO: 6.7L/min (Normal 3.4-7.8 L/min) Indexed volumes: LV EDV: 86mL/sq-m (Normal 50-108 mL/sq-m) LV ESV: 39mL/sq-m (Normal 11-47 mL/sq-m) LV SV: 71mL/sq-m (Normal 33-72 mL/sq-m) CI: 3.4L/min/sq-m (Normal 1.8-4.2 L/min/sq-m) Right ventricle: Normal size and systolic function RV EF:  50% (Normal 51-80%) Absolute volumes: RV EDV: (Normal 109-217 mL) RV ESV: 63mL (Normal 23-91 mL) RV SV: 63mL (Normal 71-141 mL) CO: 6.9L/min (Normal 2.8-8.8 L/min) Indexed volumes: RV  EDV: 38mL/sq-m (Normal 58-109 mL/sq-m) RV ESV: 9mL/sq-m (Normal 12-46 mL/sq-m) RV SV: 31mL/sq-m (Normal 38-71 mL/sq-m) CI: 3.5L/min/sq-m (Normal 1.7-4.2 L/min/sq-m) Left atrium: Normal size Right atrium: Normal size Mitral valve: Trivial regurgitation Aortic valve: Trivial regurgitation Tricuspid valve: Trivial regurgitation Pulmonic valve: Trivial regurgitation Aorta: Normal proximal ascending aorta Pericardium: Normal IMPRESSION: 1. There is hypokinesis of all LV mid ventricular segments, which considering presentation with troponin elevation and normal coronary arteries on cath, suggests mid ventricular Takotsubo cardiomyopathy 2. Asymmetric LV hypertrophy measuring 17mm in basal septum (8mm in posterior wall), meeting criteria for hypertrophic  cardiomyopathy 3. RV insertion site LGE, which is commonly seen in HCM. LGE accounts for 2% of total myocardial mass 4.  Normal LV size with moderate systolic dysfunction (EF 34%) 5.  Normal RV size and systolic function (EF 50%) Electronically Signed   By: Epifanio Lesches M.D.   On: 03/02/2023 23:36   MR CARDIAC VELOCITY FLOW MAP  Result Date: 03/02/2023 CLINICAL DATA:  23M p/w chest pain, troponin up to 1420. Cath shows nonobstructive CAD. Echo with EF 45-50%, with hypokinesis of mid segments EXAM: CARDIAC MRI TECHNIQUE: The patient was scanned on a 1.5 Tesla Siemens magnet. A dedicated cardiac coil was used. Functional imaging was done using Fiesta sequences. 2,3, and 4 chamber views were done to assess for RWMA's. Modified Simpson's rule using a short axis stack was used to calculate an ejection fraction on a dedicated work Research officer, [employer]. The patient received 10 cc of Gadavist. After 10 minutes inversion recovery sequences were used to assess for infiltration and scar tissue. Phase contrast velocity mapping was performed above the aortic and pulmonic valves CONTRAST:  10 cc  of Gadavist FINDINGS: Left ventricle: -Normal size -Asymmetric hypertrophy measuring 17mm in basal septum (8mm in posterior wall) -Moderate systolic dysfunction.  Hypokinesis of mid segments -Elevated ECV (30%).  Normal T2 values -RV insertion site LGE. LGE accounts for 2% of total myocardial mass LV EF: 34% (Normal 49-79%) Absolute volumes: LV EDV: (Normal 95-215 mL) LV ESV: (Normal 25-85 mL) LV SV: 60mL (Normal 61-145 mL) CO: 6.7L/min (Normal 3.4-7.8 L/min) Indexed volumes: LV EDV: 46mL/sq-m (Normal 50-108 mL/sq-m) LV ESV: 62mL/sq-m (Normal 11-47 mL/sq-m) LV SV: 29mL/sq-m (Normal 33-72 mL/sq-m) CI: 3.4L/min/sq-m (Normal 1.8-4.2 L/min/sq-m) Right ventricle: Normal size and systolic function RV EF:  50% (Normal 51-80%) Absolute volumes: RV EDV: (Normal 109-217 mL) RV ESV: 63mL (Normal 23-91 mL) RV  SV: 63mL (Normal 71-141 mL) CO: 6.9L/min (Normal 2.8-8.8 L/min) Indexed volumes: RV EDV: 58mL/sq-m (Normal 58-109 mL/sq-m) RV ESV: 68mL/sq-m (Normal 12-46 mL/sq-m) RV SV: 40mL/sq-m (Normal 38-71 mL/sq-m) CI: 3.5L/min/sq-m (Normal 1.7-4.2 L/min/sq-m) Left atrium: Normal size Right atrium: Normal size Mitral valve: Trivial regurgitation Aortic valve: Trivial regurgitation Tricuspid valve: Trivial regurgitation Pulmonic valve: Trivial regurgitation Aorta: Normal proximal ascending aorta Pericardium: Normal IMPRESSION: 1. There is hypokinesis of all LV mid ventricular segments, which considering presentation with troponin elevation and normal coronary arteries on cath, suggests mid ventricular Takotsubo cardiomyopathy 2. Asymmetric LV hypertrophy measuring 17mm in basal septum (8mm in posterior wall), meeting criteria for hypertrophic cardiomyopathy 3. RV insertion site LGE, which is commonly seen in HCM. LGE accounts for 2% of total myocardial mass 4.  Normal LV size with moderate systolic dysfunction (EF 34%) 5.  Normal RV size and systolic function (EF 50%) Electronically Signed   By: Epifanio Lesches M.D.   On: 03/02/2023 23:36   MR CARDIAC VELOCITY FLOW MAP  Result Date: 03/02/2023 CLINICAL DATA:  23M p/w  chest pain, troponin up to 1420. Cath shows nonobstructive CAD. Echo with EF 45-50%, with hypokinesis of mid segments EXAM: CARDIAC MRI TECHNIQUE: The patient was scanned on a 1.5 Tesla Siemens magnet. A dedicated cardiac coil was used. Functional imaging was done using Fiesta sequences. 2,3, and 4 chamber views were done to assess for RWMA's. Modified Simpson's rule using a short axis stack was used to calculate an ejection fraction on a dedicated work Research officer, [employer]. The patient received 10 cc of Gadavist. After 10 minutes inversion recovery sequences were used to assess for infiltration and scar tissue. Phase contrast velocity mapping was performed above the aortic and pulmonic valves  CONTRAST:  10 cc  of Gadavist FINDINGS: Left ventricle: -Normal size -Asymmetric hypertrophy measuring 17mm in basal septum (8mm in posterior wall) -Moderate systolic dysfunction.  Hypokinesis of mid segments -Elevated ECV (30%).  Normal T2 values -RV insertion site LGE. LGE accounts for 2% of total myocardial mass LV EF: 34% (Normal 49-79%) Absolute volumes: LV EDV: (Normal 95-215 mL) LV ESV: (Normal 25-85 mL) LV SV: 60mL (Normal 61-145 mL) CO: 6.7L/min (Normal 3.4-7.8 L/min) Indexed volumes: LV EDV: 17mL/sq-m (Normal 50-108 mL/sq-m) LV ESV: 43mL/sq-m (Normal 11-47 mL/sq-m) LV SV: 73mL/sq-m (Normal 33-72 mL/sq-m) CI: 3.4L/min/sq-m (Normal 1.8-4.2 L/min/sq-m) Right ventricle: Normal size and systolic function RV EF:  50% (Normal 51-80%) Absolute volumes: RV EDV: (Normal 109-217 mL) RV ESV: 63mL (Normal 23-91 mL) RV SV: 63mL (Normal 71-141 mL) CO: 6.9L/min (Normal 2.8-8.8 L/min) Indexed volumes: RV EDV: 42mL/sq-m (Normal 58-109 mL/sq-m) RV ESV: 34mL/sq-m (Normal 12-46 mL/sq-m) RV SV: 48mL/sq-m (Normal 38-71 mL/sq-m) CI: 3.5L/min/sq-m (Normal 1.7-4.2 L/min/sq-m) Left atrium: Normal size Right atrium: Normal size Mitral valve: Trivial regurgitation Aortic valve: Trivial regurgitation Tricuspid valve: Trivial regurgitation Pulmonic valve: Trivial regurgitation Aorta: Normal proximal ascending aorta Pericardium: Normal IMPRESSION: 1. There is hypokinesis of all LV mid ventricular segments, which considering presentation with troponin elevation and normal coronary arteries on cath, suggests mid ventricular Takotsubo cardiomyopathy 2. Asymmetric LV hypertrophy measuring 17mm in basal septum (8mm in posterior wall), meeting criteria for hypertrophic cardiomyopathy 3. RV insertion site LGE, which is commonly seen in HCM. LGE accounts for 2% of total myocardial mass 4.  Normal LV size with moderate systolic dysfunction (EF 34%) 5.  Normal RV size and systolic function (EF 50%) Electronically Signed   By:  Epifanio Lesches M.D.   On: 03/02/2023 23:36   CT Angio Chest Pulmonary Embolism (PE) W or WO Contrast  Result Date: 03/01/2023 CLINICAL DATA:  Recurrent chest pain EXAM: CT ANGIOGRAPHY CHEST WITH CONTRAST TECHNIQUE: Multidetector CT imaging of the chest was performed using the standard protocol during bolus administration of intravenous contrast. Multiplanar CT image reconstructions and MIPs were obtained to evaluate the vascular anatomy. RADIATION DOSE REDUCTION: This exam was performed according to the departmental dose-optimization program which includes automated exposure control, adjustment of the mA and/or kV according to patient size and/or use of iterative reconstruction technique. CONTRAST:  60mL OMNIPAQUE IOHEXOL 350 MG/ML SOLN COMPARISON:  Chest x-ray 02/28/2023 FINDINGS: Cardiovascular: Satisfactory opacification of the pulmonary arteries to the segmental level. No evidence of pulmonary embolism. Normal heart size. No pericardial effusion. Nonaneurysmal aorta. Moderate aortic atherosclerosis. Coronary vascular calcifications. Mediastinum/Nodes: Midline trachea. No thyroid mass. No suspicious lymph nodes. Esophagus within normal limits aside from small hiatal hernia. Mild distal esophageal thickening. Lungs/Pleura: Lungs are clear. No pleural effusion or pneumothorax. Upper Abdomen: Gallstones. Incompletely visualized left upper quadrant mass measuring 5.2 x 4.4 cm with  density values of 24, this appears to be associated with left adrenal gland. Musculoskeletal: No acute osseous abnormality Review of the MIP images confirms the above findings. IMPRESSION: 1. Negative for acute pulmonary embolus or aortic dissection. Clear lung fields. 2. 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 Surgical consultation is recommended. Consider biochemical lab evaluation for functional status and pheochromocytoma prior to  resection. 3. Gallstones. 4. Aortic atherosclerosis. Aortic Atherosclerosis (ICD10-I70.0). Electronically Signed   By: Jasmine Pang M.D.   On: 03/01/2023 18:52   ECHOCARDIOGRAM COMPLETE  Result Date: 03/01/2023    ECHOCARDIOGRAM REPORT   Patient Name:   Shane Sampson Date of Exam: 03/01/2023 Medical Rec #:  [id number]     Height:       72.0 in Accession #:    [id number]    Weight:       172.6 lb Date of Birth:  1956-07-07     BSA:          2.001 m Patient Age:    67 years      BP:           132/78 mmHg Patient Gender: M             HR:           93 bpm. Exam Location:  Inpatient Procedure: 2D Echo, Cardiac Doppler and Color Doppler Indications:    Acute ischemic heart disease  History:        Patient has prior history of Echocardiogram examinations. Risk                 Factors:Hypertension.  Sonographer:    Mike Gip Referring Phys: [id number] Donnie Coffin VARANASI IMPRESSIONS  1. Atypical pattern of abnormal wall motion involving the mid segments of the heart with preserved apical contractility. 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 Cannot exclude myocarditis. Left ventricular ejection fraction, by estimation, is 45 to 50%. The left ventricle has mildly decreased function. The left ventricle demonstrates regional wall motion abnormalities (see scoring diagram/findings for description). Left ventricular diastolic parameters are consistent with Grade I diastolic  dysfunction (impaired relaxation).  2. Right ventricular systolic function is normal. The right ventricular size is normal. Tricuspid regurgitation signal is inadequate for assessing PA pressure.  3. Left atrial size was mildly dilated.  4. The mitral valve is normal in structure. No evidence of mitral valve regurgitation. No evidence of mitral stenosis.  5. The aortic valve is tricuspid. Aortic valve regurgitation is not visualized. No aortic stenosis is present. Comparison(s): Prior images reviewed side by side. The left ventricular function  is worsened. The left ventricular diastolic function has improved. The left ventricular wall motion abnormalities are new. FINDINGS  Left Ventricle: Atypical pattern of abnormal wall motion involving the mid segments of the heart with preserved apical contractility. 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 Cannot exclude myocarditis. Left ventricular ejection fraction, by estimation, is 45 to 50%. The left ventricle has mildly decreased function. The left ventricle demonstrates regional wall motion abnormalities. The left ventricular internal cavity size was normal in size. There is no left ventricular hypertrophy. Left ventricular diastolic parameters are consistent with Grade I diastolic dysfunction (impaired relaxation). Normal left ventricular filling pressure.  LV Wall Scoring: The mid anteroseptal segment, mid inferolateral segment, mid anterolateral segment, mid inferoseptal segment, mid anterior segment, and mid inferior segment are hypokinetic. The entire apex, basal anteroseptal segment, basal  inferolateral segment, basal anterolateral segment, basal anterior segment, basal inferior segment, and basal inferoseptal segment are normal. Right Ventricle: The right ventricular size is normal. No increase in right ventricular wall thickness. Right ventricular systolic function is normal. Tricuspid regurgitation signal is inadequate for assessing PA pressure. Left Atrium: Left atrial size was mildly dilated. Right Atrium: Right atrial size was normal in size. Pericardium: There is no evidence of pericardial effusion. Mitral Valve: The mitral valve is normal in structure. No evidence of mitral valve regurgitation. No evidence of mitral valve stenosis. Tricuspid Valve: The tricuspid valve is normal in structure. Tricuspid valve regurgitation is not demonstrated. Aortic Valve: The aortic valve is tricuspid. Aortic valve regurgitation is not visualized. No aortic stenosis is present. Pulmonic Valve: The  pulmonic valve was grossly normal. Pulmonic valve regurgitation is not visualized. Aorta: The aortic root and ascending aorta are structurally normal, with no evidence of dilitation. IAS/Shunts: No atrial level shunt detected by color flow Doppler.  LEFT VENTRICLE PLAX 2D LVIDd:         4.60 cm      Diastology LVIDs:         3.30 cm      LV e' medial:    5.77 cm/s LV PW:         1.00 cm      LV E/e' medial:  8.9 LV IVS:        1.10 cm      LV e' lateral:   8.81 cm/s                             LV E/e' lateral: 5.8  LV Volumes (MOD) LV vol d, MOD A2C: 104.0 ml LV vol d, MOD A4C: 116.0 ml LV vol s, MOD A2C: 51.8 ml LV vol s, MOD A4C: 66.8 ml LV SV MOD A2C:     52.2 ml LV SV MOD A4C:     116.0 ml LV SV MOD BP:      52.6 ml RIGHT VENTRICLE RV Basal diam:  3.80 cm RV S prime:     12.50 cm/s TAPSE (M-mode): 1.8 cm LEFT ATRIUM             Index        RIGHT ATRIUM           Index LA diam:        4.20 cm 2.10 cm/m   RA Area:     11.80 cm LA Vol (A2C):   46.1 ml 23.04 ml/m  RA Volume:   21.70 ml  10.84 ml/m LA Vol (A4C):   59.3 ml 29.63 ml/m LA Biplane Vol: 57.0 ml 28.48 ml/m  AORTIC VALVE LVOT Vmax:   123.00 cm/s LVOT Vmean:  86.200 cm/s LVOT VTI:    0.208 m  AORTA Ao Asc diam: 3.30 cm MITRAL VALVE MV Area (PHT): 5.64 cm     SHUNTS MV E velocity: 51.40 cm/s   Systemic VTI: 0.21 m MV A velocity: 109.00 cm/s MV E/A ratio:  0.47 Mihai Croitoru MD Electronically signed by Thurmon Fair MD Signature Date/Time: 03/01/2023/4:28:00 PM    Final    CARDIAC CATHETERIZATION  Result Date: 03/01/2023   Mid LAD lesion is 30% stenosed.  Diffuse mild to moderate CAD.   LV end diastolic pressure is normal.   There is no aortic valve stenosis.   Tortuous right subclavian requiring 85 cm destination sheath. Mild to moderate diffuse coronary disease in  the major epicardial vessels.  No culprit lesion found.  Patient had a total of 45 minutes of chest pain.  No return of symptoms.  Possibilities include small vessel disease, vasospasm  or myocarditis.  Starting heparin 2 hours after TR band removal.  Discussed with the patient's wife, Lanora Manis.        Scheduled Meds:  amLODipine  10 mg Oral Daily   aspirin EC  81 mg Oral Daily   atorvastatin  80 mg Oral Daily   carvedilol  12.5 mg Oral BID WC   empagliflozin  10 mg Oral Daily   ezetimibe  10 mg Oral Daily   sacubitril-valsartan  1 tablet Oral BID   sodium chloride flush  3 mL Intravenous Q12H   spironolactone  12.5 mg Oral Daily   tamsulosin  0.4 mg Oral Daily   Continuous Infusions:  sodium chloride       LOS: 2 days    Time spent: 35 minutes    Dorcas Carrow, MD [facility] Pager [phone]

## 2023-03-03 NOTE — Telephone Encounter (Signed)
Patient Advocate Encounter  Prior Authorization for Jardiance 10MG  tablets has been approved.    PA# [id number] Insurance [employer] Electronic PA Form Effective dates: 03/03/2023 through 03/02/2024  Patients co-pay is $587.52 due to $2000 deductible.     Roland Earl, CPhT Pharmacy Patient Advocate Specialist [facility] Patient Advocate Team Direct Number: [phone]  Fax: [phone]

## 2023-03-03 NOTE — Progress Notes (Signed)
CARDIAC REHAB PHASE I    Post MI education including site care, restrictions, risk factors, MI booklet, exercise guidelines, heart healthy diet and CRP2 reviewed. All questions and concerns addressed. Will refer to [facility] for CRP2. Pt going off floor for procedure soon. Floor RN will change out to portable tele box and feels pt can ambulate independently with wife, once he returns to room. Will continue to follow.   [id number]  Woodroe Chen, RN BSN 03/03/2023 12:21 PM

## 2023-03-03 NOTE — Progress Notes (Addendum)
Rounding Note    Patient Name: Shane Sampson Date of Encounter: 03/03/2023  [facility] Cardiologist: Little Ishikawa, MD   Subjective   Feeling well this morning.   Inpatient Medications    Scheduled Meds:  amLODipine  10 mg Oral Daily   aspirin EC  81 mg Oral Daily   atorvastatin  80 mg Oral Daily   carvedilol  12.5 mg Oral BID WC   ezetimibe  10 mg Oral Daily   sacubitril-valsartan  1 tablet Oral BID   sodium chloride flush  3 mL Intravenous Q12H   spironolactone  12.5 mg Oral Daily   tamsulosin  0.4 mg Oral Daily   Continuous Infusions:  sodium chloride     PRN Meds: sodium chloride, acetaminophen, acetaminophen, nitroGLYCERIN, ondansetron (ZOFRAN) IV, ondansetron (ZOFRAN) IV, sodium chloride flush   Vital Signs    Vitals:   03/02/23 2348 03/03/23 0325 03/03/23 0752 03/03/23 0909  BP: 122/86 (!) 131/94 136/86 128/87  Pulse: (!) 103 100 95   Resp: 20 20 15 19   Temp: 98.8 F (37.1 C) 98 F (36.7 C) 98 F (36.7 C)   TempSrc: Oral Oral Oral   SpO2: 96% 96% 95%   Weight:  75.2 kg    Height:        Intake/Output Summary (Last 24 hours) at 03/03/2023 1114 Last data filed at 03/03/2023 0752 Gross per 24 hour  Intake 2032 ml  Output 1800 ml  Net 232 ml      03/03/2023    3:25 AM 03/02/2023    5:00 AM 03/01/2023    2:16 PM  Last 3 Weights  Weight (lbs) 165 lb 12.6 oz 168 lb 11.2 oz 172 lb 9.6 oz  Weight (kg) 75.2 kg 76.522 kg 78.291 kg      Telemetry    Sinus rhythm - Personally Reviewed  ECG    No new tracing  Physical Exam   GEN: No acute distress.   Neck: No JVD Cardiac: RRR, no murmurs, rubs, or gallops.  Respiratory: Clear to auscultation bilaterally. GI: Soft, nontender, non-distended  MS: No edema; No deformity. Neuro:  Nonfocal  Psych: Normal affect   Labs    High Sensitivity Troponin:   Recent Labs  Lab 02/28/23 2115 02/28/23 2335  TROPONINIHS 294* 1,420*     Chemistry Recent Labs  Lab 03/01/23 0124  03/02/23 1040 03/03/23 0556  NA 138 136 135  K 4.1 3.9 4.0  CL 104 101 101  CO2 23 24 22   GLUCOSE 149* 178* 141*  BUN 27* 15 19  CREATININE 1.69* 1.15 1.21  CALCIUM 9.3 9.2 9.0  PROT 6.5  --   --   ALBUMIN 3.7  --   --   AST 22  --   --   ALT 24  --   --   ALKPHOS 74  --   --   BILITOT 1.2  --   --   GFRNONAA 44* >60 >60  ANIONGAP 11 11 12     Lipids  Recent Labs  Lab 03/01/23 0124  CHOL 90  TRIG 86  HDL 30*  LDLCALC 43  CHOLHDL 3.0    Hematology Recent Labs  Lab 02/28/23 2115 02/28/23 2134 03/01/23 0124 03/02/23 1040  WBC 16.5*  --  15.5* 17.1*  RBC 4.94  --  4.64 5.13  HGB 14.7 14.6 13.9 15.5  HCT 44.1 43.0 41.7 45.0  MCV 89.3  --  89.9 87.7  MCH 29.8  --  30.0  30.2  MCHC 33.3  --  33.3 34.4  RDW 13.2  --  13.4 13.2  PLT 444*  --  426* 388   Thyroid No results for input(s): "TSH", "FREET4" in the last 168 hours.  BNPNo results for input(s): "BNP", "PROBNP" in the last 168 hours.  DDimer  Recent Labs  Lab 03/01/23 1806  DDIMER <0.27     Radiology    MR CARDIAC MORPHOLOGY W WO CONTRAST  Result Date: 03/02/2023 CLINICAL DATA:  68M p/w chest pain, troponin up to 1420. Cath shows nonobstructive CAD. Echo with EF 45-50%, with hypokinesis of mid segments EXAM: CARDIAC MRI TECHNIQUE: The patient was scanned on a 1.5 Tesla Siemens magnet. A dedicated cardiac coil was used. Functional imaging was done using Fiesta sequences. 2,3, and 4 chamber views were done to assess for RWMA's. Modified Simpson's rule using a short axis stack was used to calculate an ejection fraction on a dedicated work [employer]. The patient received 10 cc of Gadavist. After 10 minutes inversion recovery sequences were used to assess for infiltration and scar tissue. Phase contrast velocity mapping was performed above the aortic and pulmonic valves CONTRAST:  10 cc  of Gadavist FINDINGS: Left ventricle: -Normal size -Asymmetric hypertrophy measuring 17mm in basal septum (8mm  in posterior wall) -Moderate systolic dysfunction.  Hypokinesis of mid segments -Elevated ECV (30%).  Normal T2 values -RV insertion site LGE. LGE accounts for 2% of total myocardial mass LV EF: 34% (Normal 49-79%) Absolute volumes: LV EDV: (Normal 95-215 mL) LV ESV: (Normal 25-85 mL) LV SV: 60mL (Normal 61-145 mL) CO: 6.7L/min (Normal 3.4-7.8 L/min) Indexed volumes: LV EDV: 44mL/sq-m (Normal 50-108 mL/sq-m) LV ESV: 39mL/sq-m (Normal 11-47 mL/sq-m) LV SV: 41mL/sq-m (Normal 33-72 mL/sq-m) CI: 3.4L/min/sq-m (Normal 1.8-4.2 L/min/sq-m) Right ventricle: Normal size and systolic function RV EF:  50% (Normal 51-80%) Absolute volumes: RV EDV: (Normal 109-217 mL) RV ESV: 63mL (Normal 23-91 mL) RV SV: 63mL (Normal 71-141 mL) CO: 6.9L/min (Normal 2.8-8.8 L/min) Indexed volumes: RV EDV: 64mL/sq-m (Normal 58-109 mL/sq-m) RV ESV: 26mL/sq-m (Normal 12-46 mL/sq-m) RV SV: 37mL/sq-m (Normal 38-71 mL/sq-m) CI: 3.5L/min/sq-m (Normal 1.7-4.2 L/min/sq-m) Left atrium: Normal size Right atrium: Normal size Mitral valve: Trivial regurgitation Aortic valve: Trivial regurgitation Tricuspid valve: Trivial regurgitation Pulmonic valve: Trivial regurgitation Aorta: Normal proximal ascending aorta Pericardium: Normal IMPRESSION: 1. There is hypokinesis of all LV mid ventricular segments, which considering presentation with troponin elevation and normal coronary arteries on cath, suggests mid ventricular Takotsubo cardiomyopathy 2. Asymmetric LV hypertrophy measuring 17mm in basal septum (8mm in posterior wall), meeting criteria for hypertrophic cardiomyopathy 3. RV insertion site LGE, which is commonly seen in HCM. LGE accounts for 2% of total myocardial mass 4.  Normal LV size with moderate systolic dysfunction (EF 34%) 5.  Normal RV size and systolic function (EF 50%) Electronically Signed   By: Epifanio Lesches M.D.   On: 03/02/2023 23:36   MR CARDIAC VELOCITY FLOW MAP  Result Date: 03/02/2023 CLINICAL DATA:  68M  p/w chest pain, troponin up to 1420. Cath shows nonobstructive CAD. Echo with EF 45-50%, with hypokinesis of mid segments EXAM: CARDIAC MRI TECHNIQUE: The patient was scanned on a 1.5 Tesla Siemens magnet. A dedicated cardiac coil was used. Functional imaging was done using Fiesta sequences. 2,3, and 4 chamber views were done to assess for RWMA's. Modified Simpson's rule using a short axis stack was used to calculate an ejection fraction on a dedicated work [employer], [employer]. The patient received  10 cc of Gadavist. After 10 minutes inversion recovery sequences were used to assess for infiltration and scar tissue. Phase contrast velocity mapping was performed above the aortic and pulmonic valves CONTRAST:  10 cc  of Gadavist FINDINGS: Left ventricle: -Normal size -Asymmetric hypertrophy measuring 17mm in basal septum (8mm in posterior wall) -Moderate systolic dysfunction.  Hypokinesis of mid segments -Elevated ECV (30%).  Normal T2 values -RV insertion site LGE. LGE accounts for 2% of total myocardial mass LV EF: 34% (Normal 49-79%) Absolute volumes: LV EDV: (Normal 95-215 mL) LV ESV: (Normal 25-85 mL) LV SV: 60mL (Normal 61-145 mL) CO: 6.7L/min (Normal 3.4-7.8 L/min) Indexed volumes: LV EDV: 4mL/sq-m (Normal 50-108 mL/sq-m) LV ESV: 62mL/sq-m (Normal 11-47 mL/sq-m) LV SV: 29mL/sq-m (Normal 33-72 mL/sq-m) CI: 3.4L/min/sq-m (Normal 1.8-4.2 L/min/sq-m) Right ventricle: Normal size and systolic function RV EF:  50% (Normal 51-80%) Absolute volumes: RV EDV: (Normal 109-217 mL) RV ESV: 63mL (Normal 23-91 mL) RV SV: 63mL (Normal 71-141 mL) CO: 6.9L/min (Normal 2.8-8.8 L/min) Indexed volumes: RV EDV: 62mL/sq-m (Normal 58-109 mL/sq-m) RV ESV: 62mL/sq-m (Normal 12-46 mL/sq-m) RV SV: 13mL/sq-m (Normal 38-71 mL/sq-m) CI: 3.5L/min/sq-m (Normal 1.7-4.2 L/min/sq-m) Left atrium: Normal size Right atrium: Normal size Mitral valve: Trivial regurgitation Aortic valve: Trivial regurgitation Tricuspid  valve: Trivial regurgitation Pulmonic valve: Trivial regurgitation Aorta: Normal proximal ascending aorta Pericardium: Normal IMPRESSION: 1. There is hypokinesis of all LV mid ventricular segments, which considering presentation with troponin elevation and normal coronary arteries on cath, suggests mid ventricular Takotsubo cardiomyopathy 2. Asymmetric LV hypertrophy measuring 17mm in basal septum (8mm in posterior wall), meeting criteria for hypertrophic cardiomyopathy 3. RV insertion site LGE, which is commonly seen in HCM. LGE accounts for 2% of total myocardial mass 4.  Normal LV size with moderate systolic dysfunction (EF 34%) 5.  Normal RV size and systolic function (EF 50%) Electronically Signed   By: Epifanio Lesches M.D.   On: 03/02/2023 23:36   MR CARDIAC VELOCITY FLOW MAP  Result Date: 03/02/2023 CLINICAL DATA:  33M p/w chest pain, troponin up to 1420. Cath shows nonobstructive CAD. Echo with EF 45-50%, with hypokinesis of mid segments EXAM: CARDIAC MRI TECHNIQUE: The patient was scanned on a 1.5 Tesla Siemens magnet. A dedicated cardiac coil was used. Functional imaging was done using Fiesta sequences. 2,3, and 4 chamber views were done to assess for RWMA's. Modified Simpson's rule using a short axis stack was used to calculate an ejection fraction on a dedicated work [employer] officer, [employer]. The patient received 10 cc of Gadavist. After 10 minutes inversion recovery sequences were used to assess for infiltration and scar tissue. Phase contrast velocity mapping was performed above the aortic and pulmonic valves CONTRAST:  10 cc  of Gadavist FINDINGS: Left ventricle: -Normal size -Asymmetric hypertrophy measuring 17mm in basal septum (8mm in posterior wall) -Moderate systolic dysfunction.  Hypokinesis of mid segments -Elevated ECV (30%).  Normal T2 values -RV insertion site LGE. LGE accounts for 2% of total myocardial mass LV EF: 34% (Normal 49-79%) Absolute volumes: LV EDV: (Normal  95-215 mL) LV ESV: (Normal 25-85 mL) LV SV: 60mL (Normal 61-145 mL) CO: 6.7L/min (Normal 3.4-7.8 L/min) Indexed volumes: LV EDV: 68mL/sq-m (Normal 50-108 mL/sq-m) LV ESV: 82mL/sq-m (Normal 11-47 mL/sq-m) LV SV: 50mL/sq-m (Normal 33-72 mL/sq-m) CI: 3.4L/min/sq-m (Normal 1.8-4.2 L/min/sq-m) Right ventricle: Normal size and systolic function RV EF:  50% (Normal 51-80%) Absolute volumes: RV EDV: (Normal 109-217 mL) RV ESV: 63mL (Normal 23-91 mL) RV SV: 63mL (Normal 71-141 mL)  CO: 6.9L/min (Normal 2.8-8.8 L/min) Indexed volumes: RV EDV: 26mL/sq-m (Normal 58-109 mL/sq-m) RV ESV: 45mL/sq-m (Normal 12-46 mL/sq-m) RV SV: 30mL/sq-m (Normal 38-71 mL/sq-m) CI: 3.5L/min/sq-m (Normal 1.7-4.2 L/min/sq-m) Left atrium: Normal size Right atrium: Normal size Mitral valve: Trivial regurgitation Aortic valve: Trivial regurgitation Tricuspid valve: Trivial regurgitation Pulmonic valve: Trivial regurgitation Aorta: Normal proximal ascending aorta Pericardium: Normal IMPRESSION: 1. There is hypokinesis of all LV mid ventricular segments, which considering presentation with troponin elevation and normal coronary arteries on cath, suggests mid ventricular Takotsubo cardiomyopathy 2. Asymmetric LV hypertrophy measuring 17mm in basal septum (8mm in posterior wall), meeting criteria for hypertrophic cardiomyopathy 3. RV insertion site LGE, which is commonly seen in HCM. LGE accounts for 2% of total myocardial mass 4.  Normal LV size with moderate systolic dysfunction (EF 34%) 5.  Normal RV size and systolic function (EF 50%) Electronically Signed   By: Epifanio Lesches M.D.   On: 03/02/2023 23:36   CT Angio Chest Pulmonary Embolism (PE) W or WO Contrast  Result Date: 03/01/2023 CLINICAL DATA:  Recurrent chest pain EXAM: CT ANGIOGRAPHY CHEST WITH CONTRAST TECHNIQUE: Multidetector CT imaging of the chest was performed using the standard protocol during bolus administration of intravenous contrast. Multiplanar CT image  reconstructions and MIPs were obtained to evaluate the vascular anatomy. RADIATION DOSE REDUCTION: This exam was performed according to the departmental dose-optimization program which includes automated exposure control, adjustment of the mA and/or kV according to patient size and/or use of iterative reconstruction technique. CONTRAST:  60mL OMNIPAQUE IOHEXOL 350 MG/ML SOLN COMPARISON:  Chest x-ray 02/28/2023 FINDINGS: Cardiovascular: Satisfactory opacification of the pulmonary arteries to the segmental level. No evidence of pulmonary embolism. Normal heart size. No pericardial effusion. Nonaneurysmal aorta. Moderate aortic atherosclerosis. Coronary vascular calcifications. Mediastinum/Nodes: Midline trachea. No thyroid mass. No suspicious lymph nodes. Esophagus within normal limits aside from small hiatal hernia. Mild distal esophageal thickening. Lungs/Pleura: Lungs are clear. No pleural effusion or pneumothorax. Upper Abdomen: Gallstones. Incompletely visualized left upper quadrant mass measuring 5.2 x 4.4 cm with density values of 24, this appears to be associated with left adrenal gland. Musculoskeletal: No acute osseous abnormality Review of the MIP images confirms the above findings. IMPRESSION: 1. Negative for acute pulmonary embolus or aortic dissection. Clear lung fields. 2. 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 Surgical consultation is recommended. Consider biochemical lab evaluation for functional status and pheochromocytoma prior to resection. 3. Gallstones. 4. Aortic atherosclerosis. Aortic Atherosclerosis (ICD10-I70.0). Electronically Signed   By: Jasmine Pang M.D.   On: 03/01/2023 18:52   ECHOCARDIOGRAM COMPLETE  Result Date: 03/01/2023    ECHOCARDIOGRAM REPORT   Patient Name:   Shane Sampson Date of Exam: 03/01/2023 Medical Rec #:  [id number]     Height:       72.0 in Accession #:    [id number]     Weight:       172.6 lb Date of Birth:  [birth year]     BSA:          2.001 m Patient Age:    67 years      BP:           132/78 mmHg Patient Gender: M             HR:           93 bpm. Exam Location:  Inpatient Procedure: 2D Echo, Cardiac Doppler and Color Doppler Indications:    Acute  ischemic heart disease  History:        Patient has prior history of Echocardiogram examinations. Risk                 Factors:Hypertension.  Sonographer:    Mike Gip Referring Phys: [id number] Donnie Coffin VARANASI IMPRESSIONS  1. Atypical pattern of abnormal wall motion involving the mid segments of the heart with preserved apical contractility. 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 Cannot exclude myocarditis. Left ventricular ejection fraction, by estimation, is 45 to 50%. The left ventricle has mildly decreased function. The left ventricle demonstrates regional wall motion abnormalities (see scoring diagram/findings for description). Left ventricular diastolic parameters are consistent with Grade I diastolic  dysfunction (impaired relaxation).  2. Right ventricular systolic function is normal. The right ventricular size is normal. Tricuspid regurgitation signal is inadequate for assessing PA pressure.  3. Left atrial size was mildly dilated.  4. The mitral valve is normal in structure. No evidence of mitral valve regurgitation. No evidence of mitral stenosis.  5. The aortic valve is tricuspid. Aortic valve regurgitation is not visualized. No aortic stenosis is present. Comparison(s): Prior images reviewed side by side. The left ventricular function is worsened. The left ventricular diastolic function has improved. The left ventricular wall motion abnormalities are new. FINDINGS  Left Ventricle: Atypical pattern of abnormal wall motion involving the mid segments of the heart with preserved apical contractility. 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 Cannot exclude myocarditis. Left ventricular ejection  fraction, by estimation, is 45 to 50%. The left ventricle has mildly decreased function. The left ventricle demonstrates regional wall motion abnormalities. The left ventricular internal cavity size was normal in size. There is no left ventricular hypertrophy. Left ventricular diastolic parameters are consistent with Grade I diastolic dysfunction (impaired relaxation). Normal left ventricular filling pressure.  LV Wall Scoring: The mid anteroseptal segment, mid inferolateral segment, mid anterolateral segment, mid inferoseptal segment, mid anterior segment, and mid inferior segment are hypokinetic. The entire apex, basal anteroseptal segment, basal inferolateral segment, basal anterolateral segment, basal anterior segment, basal inferior segment, and basal inferoseptal segment are normal. Right Ventricle: The right ventricular size is normal. No increase in right ventricular wall thickness. Right ventricular systolic function is normal. Tricuspid regurgitation signal is inadequate for assessing PA pressure. Left Atrium: Left atrial size was mildly dilated. Right Atrium: Right atrial size was normal in size. Pericardium: There is no evidence of pericardial effusion. Mitral Valve: The mitral valve is normal in structure. No evidence of mitral valve regurgitation. No evidence of mitral valve stenosis. Tricuspid Valve: The tricuspid valve is normal in structure. Tricuspid valve regurgitation is not demonstrated. Aortic Valve: The aortic valve is tricuspid. Aortic valve regurgitation is not visualized. No aortic stenosis is present. Pulmonic Valve: The pulmonic valve was grossly normal. Pulmonic valve regurgitation is not visualized. Aorta: The aortic root and ascending aorta are structurally normal, with no evidence of dilitation. IAS/Shunts: No atrial level shunt detected by color flow Doppler.  LEFT VENTRICLE PLAX 2D LVIDd:         4.60 cm      Diastology LVIDs:         3.30 cm      LV e' medial:    5.77 cm/s LV PW:          1.00 cm      LV E/e' medial:  8.9 LV IVS:        1.10 cm      LV e' lateral:  8.81 cm/s                             LV E/e' lateral: 5.8  LV Volumes (MOD) LV vol d, MOD A2C: 104.0 ml LV vol d, MOD A4C: 116.0 ml LV vol s, MOD A2C: 51.8 ml LV vol s, MOD A4C: 66.8 ml LV SV MOD A2C:     52.2 ml LV SV MOD A4C:     116.0 ml LV SV MOD BP:      52.6 ml RIGHT VENTRICLE RV Basal diam:  3.80 cm RV S prime:     12.50 cm/s TAPSE (M-mode): 1.8 cm LEFT ATRIUM             Index        RIGHT ATRIUM           Index LA diam:        4.20 cm 2.10 cm/m   RA Area:     11.80 cm LA Vol (A2C):   46.1 ml 23.04 ml/m  RA Volume:   21.70 ml  10.84 ml/m LA Vol (A4C):   59.3 ml 29.63 ml/m LA Biplane Vol: 57.0 ml 28.48 ml/m  AORTIC VALVE LVOT Vmax:   123.00 cm/s LVOT Vmean:  86.200 cm/s LVOT VTI:    0.208 m  AORTA Ao Asc diam: 3.30 cm MITRAL VALVE MV Area (PHT): 5.64 cm     SHUNTS MV E velocity: 51.40 cm/s   Systemic VTI: 0.21 m MV A velocity: 109.00 cm/s MV E/A ratio:  0.47 Mihai Croitoru MD Electronically signed by Thurmon Fair MD Signature Date/Time: 03/01/2023/4:28:00 PM    Final    CARDIAC CATHETERIZATION  Result Date: 03/01/2023   Mid LAD lesion is 30% stenosed.  Diffuse mild to moderate CAD.   LV end diastolic pressure is normal.   There is no aortic valve stenosis.   Tortuous right subclavian requiring 85 cm destination sheath. Mild to moderate diffuse coronary disease in the major epicardial vessels.  No culprit lesion found.  Patient had a total of 45 minutes of chest pain.  No return of symptoms.  Possibilities include small vessel disease, vasospasm or myocarditis.  Starting heparin 2 hours after TR band removal.  Discussed with the patient's wife, Lanora Manis.    Cardiac Studies   Cath: 03/01/2023     Mid LAD lesion is 30% stenosed.  Diffuse mild to moderate CAD.   LV end diastolic pressure is normal.   There is no aortic valve stenosis.   Tortuous right subclavian requiring 85 cm destination sheath.   Mild  to moderate diffuse coronary disease in the major epicardial vessels.  No culprit lesion found.  Patient had a total of 45 minutes of chest pain.  No return of symptoms.  Possibilities include small vessel disease, vasospasm or myocarditis.  Starting heparin 2 hours after TR band removal.  Discussed with the patient's wife, Lanora Manis.    Echo: 03/01/2023   IMPRESSIONS     1. Atypical pattern of abnormal wall motion involving the mid segments of  the heart with preserved apical contractility. Suspect a mid cavity  variant of takotsubo syndrome. Cannot exclude myocarditis. Left  ventricular ejection fraction, by estimation,  is 45 to 50%. The left ventricle has mildly decreased function. The left  ventricle demonstrates regional wall motion abnormalities (see scoring  diagram/findings for description). Left ventricular diastolic parameters  are consistent with Grade I diastolic   dysfunction (impaired relaxation).  2. Right ventricular systolic function is normal. The right ventricular  size is normal. Tricuspid regurgitation signal is inadequate for assessing  PA pressure.   3. Left atrial size was mildly dilated.   4. The mitral valve is normal in structure. No evidence of mitral valve  regurgitation. No evidence of mitral stenosis.   5. The aortic valve is tricuspid. Aortic valve regurgitation is not  visualized. No aortic stenosis is present.   Comparison(s): Prior images reviewed side by side. The left ventricular  function is worsened. The left ventricular diastolic function has  improved. The left ventricular wall motion abnormalities are new.   FINDINGS   Left Ventricle: Atypical pattern of abnormal wall motion involving the  mid segments of the heart with preserved apical contractility. Suspect a  mid cavity variant of takotsubo syndrome. Cannot exclude myocarditis. Left  ventricular ejection fraction, by  estimation, is 45 to 50%. The left ventricle has mildly decreased   function. The left ventricle demonstrates regional wall motion  abnormalities. The left ventricular internal cavity size was normal in  size. There is no left ventricular hypertrophy. Left  ventricular diastolic parameters are consistent with Grade I diastolic  dysfunction (impaired relaxation). Normal left ventricular filling  pressure.     LV Wall Scoring:  The mid anteroseptal segment, mid inferolateral segment, mid anterolateral  segment, mid inferoseptal segment, mid anterior segment, and mid inferior  segment are hypokinetic. The entire apex, basal anteroseptal segment,  basal  inferolateral segment, basal anterolateral segment, basal anterior  segment,  basal inferior segment, and basal inferoseptal segment are normal.   Right Ventricle: The right ventricular size is normal. No increase in  right ventricular wall thickness. Right ventricular systolic function is  normal. Tricuspid regurgitation signal is inadequate for assessing PA  pressure.   Left Atrium: Left atrial size was mildly dilated.   Right Atrium: Right atrial size was normal in size.   Pericardium: There is no evidence of pericardial effusion.   Mitral Valve: The mitral valve is normal in structure. No evidence of  mitral valve regurgitation. No evidence of mitral valve stenosis.   Tricuspid Valve: The tricuspid valve is normal in structure. Tricuspid  valve regurgitation is not demonstrated.   Aortic Valve: The aortic valve is tricuspid. Aortic valve regurgitation is  not visualized. No aortic stenosis is present.   Pulmonic Valve: The pulmonic valve was grossly normal. Pulmonic valve  regurgitation is not visualized.   Aorta: The aortic root and ascending aorta are structurally normal, with  no evidence of dilitation.   IAS/Shunts: No atrial level shunt detected by color flow Doppler.  Cardiac MRI: 03/02/2023    IMPRESSION: 1. There is hypokinesis of all LV mid ventricular segments,  which considering presentation with troponin elevation and normal coronary arteries on cath, suggests mid ventricular Takotsubo cardiomyopathy   2. Asymmetric LV hypertrophy measuring 17mm in basal septum (8mm in posterior wall), meeting criteria for hypertrophic cardiomyopathy   3. RV insertion site LGE, which is commonly seen in HCM. LGE accounts for 2% of total myocardial mass   4.  Normal LV size with moderate systolic dysfunction (EF 34%)   5.  Normal RV size and systolic function (EF 50%)     Electronically Signed   By: Epifanio Lesches M.D.   On: 03/02/2023 23:36  Patient Profile     67 y.o. male with PMH of HTN, HLD and BPH who presented with chest pain and elevated troponin   Assessment & Plan  NSTEMI Chest pain -- presented to ED with chest pain and shortness of breath after playing golf. hsTn 294>>1420. Received SL nitro with improved symptoms. Underwent cardiac cath noted above with no significant CAD, or culprit lesion noted. 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  -- continue ASA, statin, coreg, add entresto and jardiance   HTN -- initially hypertensive on admission then improved. Developed refractory HTN yesterday afternoon with episodes of sinus tachycardia. Improved with IV pain meds. 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 EF 35%. Getting on GDMT. W/U of abd mass in progress. OK for DC from cards perspective after abd mss w/u complete.   Runell Gess, M.D., FACP, [facility], [facility] [address] [place], [place]  [address]  [phone] 03/03/2023 1:46 PM

## 2023-03-04 ENCOUNTER — Telehealth: Payer: Self-pay | Admitting: Cardiology

## 2023-03-04 ENCOUNTER — Other Ambulatory Visit (HOSPITAL_COMMUNITY): Payer: Self-pay

## 2023-03-04 DIAGNOSIS — I214 Non-ST elevation (NSTEMI) myocardial infarction: Secondary | ICD-10-CM | POA: Diagnosis not present

## 2023-03-04 MED ORDER — SACUBITRIL-VALSARTAN 24-26 MG PO TABS
1.0000 | ORAL_TABLET | Freq: Two times a day (BID) | ORAL | 1 refills | Status: DC
  Filled 2023-03-04: qty 60, 30d supply, fill #0

## 2023-03-04 MED ORDER — ATORVASTATIN CALCIUM 80 MG PO TABS
80.0000 mg | ORAL_TABLET | Freq: Every day | ORAL | 1 refills | Status: DC
  Filled 2023-03-04: qty 30, 30d supply, fill #0

## 2023-03-04 MED ORDER — SPIRONOLACTONE 25 MG PO TABS
12.5000 mg | ORAL_TABLET | Freq: Every day | ORAL | 0 refills | Status: DC
  Filled 2023-03-04: qty 45, 90d supply, fill #0

## 2023-03-04 MED ORDER — NITROGLYCERIN 0.4 MG SL SUBL
0.4000 mg | SUBLINGUAL_TABLET | SUBLINGUAL | 2 refills | Status: DC | PRN
  Filled 2023-03-04: qty 25, 5d supply, fill #0

## 2023-03-04 MED ORDER — EMPAGLIFLOZIN 10 MG PO TABS
10.0000 mg | ORAL_TABLET | Freq: Every day | ORAL | 1 refills | Status: DC
  Filled 2023-03-04: qty 30, 30d supply, fill #0

## 2023-03-04 NOTE — TOC Transition Note (Signed)
Transition of Care [facility]) - CM/SW Discharge Note   Patient Details  Name: Shane Sampson MRN: [id number] Date of Birth: Oct 17, 1955  [facility] CM/SW Contact:  Leone Haven, RN Phone Number: 03/04/2023, 11:17 AM   Clinical Narrative:    Patient is for dc today, she is at the bedside and will transport him home today.  Patient has no issues with his medications.  He has no needs.          Patient Goals and CMS Choice      Discharge Placement                         Discharge Plan and Services Additional resources added to the After Visit Summary for                                       Social Determinants of Health (SDOH) Interventions SDOH Screenings   Food Insecurity: No Food Insecurity (03/01/2023)  Housing: Low Risk  (03/01/2023)  Transportation Needs: No Transportation Needs (03/01/2023)  Utilities: Not At Risk (03/01/2023)  Tobacco Use: Low Risk  (03/02/2023)     Readmission Risk Interventions     No data to display

## 2023-03-04 NOTE — Telephone Encounter (Signed)
Patient still in hospital .  Will need call back Tuesday for [facility]

## 2023-03-04 NOTE — Discharge Summary (Addendum)
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  Diffuse mild to moderate CAD.   LV end diastolic pressure is normal.   There is no aortic valve stenosis.   Tortuous right subclavian requiring 85 cm destination sheath.   Mild to moderate diffuse coronary disease in the major epicardial vessels.  No culprit lesion found.  Patient had a total of 45 minutes of chest pain.  No return of symptoms.  Possibilities include small vessel disease, vasospasm or myocarditis.  Starting heparin 2 hours after TR band removal.  Discussed with the patient's wife, Shane Sampson.    Echo: 03/01/2023   IMPRESSIONS     1. Atypical pattern of abnormal wall motion involving the mid segments of  the heart with preserved apical contractility. Suspect a mid cavity  variant of takotsubo syndrome. Cannot exclude myocarditis. Left  ventricular ejection fraction, by estimation,  is 45 to 50%. The left ventricle has mildly decreased function. The left  ventricle demonstrates regional wall motion abnormalities (see scoring  diagram/findings for description). Left ventricular diastolic parameters  are consistent with Grade I diastolic   dysfunction (impaired relaxation).   2. Right ventricular systolic function is normal. The right ventricular  size is normal. Tricuspid regurgitation signal is  inadequate for assessing  PA pressure.   3. Left atrial size was mildly dilated.   4. The mitral valve is normal in structure. No evidence of mitral valve  regurgitation. No evidence of mitral stenosis.   5. The aortic valve is tricuspid. Aortic valve regurgitation is not  visualized. No aortic stenosis is present.   Comparison(s): Prior images reviewed side by side. The left ventricular  function is worsened. The left ventricular diastolic function has  improved. The left ventricular wall motion abnormalities are new.   FINDINGS   Left Ventricle: Atypical pattern of abnormal wall motion involving the  mid segments of the heart with preserved apical contractility. Suspect a  mid cavity variant of takotsubo syndrome. Cannot exclude myocarditis. Left  ventricular ejection fraction, by  estimation, is 45 to 50%. The left ventricle has mildly decreased  function. The left ventricle demonstrates regional wall motion  abnormalities. The left ventricular internal cavity size was normal in  size. There is no left ventricular hypertrophy. Left  ventricular diastolic parameters are consistent with Grade I diastolic  dysfunction (impaired relaxation). Normal left ventricular filling  pressure.     LV Wall Scoring:  The mid anteroseptal segment, mid inferolateral segment, mid anterolateral  segment, mid inferoseptal segment, mid anterior segment, and mid inferior  segment are hypokinetic. The entire apex, basal anteroseptal segment,  basal  inferolateral segment, basal anterolateral segment, basal anterior  segment,  basal inferior segment, and basal inferoseptal segment are normal.   Right Ventricle: The right ventricular size is normal. No increase in  right ventricular wall thickness. Right  ventricular systolic function is  normal. Tricuspid regurgitation signal is inadequate for assessing PA  pressure.   Left Atrium: Left atrial size was mildly dilated.   Right Atrium: Right atrial  size was normal in size.   Pericardium: There is no evidence of pericardial effusion.   Mitral Valve: The mitral valve is normal in structure. No evidence of  mitral valve regurgitation. No evidence of mitral valve stenosis.   Tricuspid Valve: The tricuspid valve is normal in structure. Tricuspid  valve regurgitation is not demonstrated.   Aortic Valve: The aortic valve is tricuspid. Aortic valve regurgitation is  not visualized. No aortic stenosis is present.   Pulmonic Valve: The pulmonic valve was grossly normal. Pulmonic valve  regurgitation is not visualized.   Aorta: The aortic root and ascending aorta are structurally normal, with  no evidence of dilitation.   IAS/Shunts: No atrial level shunt detected by color flow Doppler.   Cardiac MRI: 03/02/2023     IMPRESSION: 1. There is hypokinesis of all LV mid ventricular segments, which considering presentation with troponin elevation and normal coronary arteries on cath, suggests mid ventricular Takotsubo cardiomyopathy   2. Asymmetric LV hypertrophy measuring 17mm in basal septum (8mm in posterior wall), meeting criteria for hypertrophic cardiomyopathy   3. RV insertion site LGE, which is commonly seen in HCM. LGE accounts for 2% of total myocardial mass   4.  Normal LV size with moderate systolic dysfunction (EF 34%)   5.  Normal RV size and systolic function (EF 50%)     Electronically Signed   By: Epifanio Lesches M.D.   On: 03/02/2023 23:36 _____________   History of Present Illness     Shane Sampson is a 67 y.o. male with a history of hypertension, hyperlipidemia, BPH who has had well-controlled hypertension for many years on multiple medications and is seen routinely by his primary care doctor.  He is very active at baseline and plays golf walking with clubs routinely.  He had been in his usual state of health up until the day prior to admission when he was on the golf course and had episode of significant  chest discomfort and some left arm pain.  Also felt like he was having some heartburn.  It was persistent so he called 911.  No ECG changes initially but was found to be tachycardic and extremely hypertensive to the 200s.   On arrival to the emergency department he was treated with nitroglycerin and his blood pressure came down at that time his symptoms started to improve some to.  However when I saw him he still did have some chest pain despite blood pressure normalizing. Trop 294 >> 1420.   Hospital Course     Consultants: IM   NSTEMI Chest pain -- presented to ED with chest pain and shortness of breath after playing golf. hsTn 294>>1420. Received SL nitro with improved symptoms. Underwent cardiac cath noted above with no significant CAD, or culprit lesion noted. 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  -- continue ASA, statin, coreg, add entresto and jardiance -- as below, he will need to likely wait 6-8 weeks prior to any surgical procedures    HTN -- initially hypertensive on admission then improved. Developed refractory HTN post cath with episodes of sinus tachycardia. Improved with IV pain meds. IV nitro was weaned and DC'ed -- continue coreg 12.5mg  BID, entresto 24-26mg  BID, spiro  12.5mg  daily, jardiance    HLD -- LDL 43, HDL 30 -- on atorvastatin 40mg  PTA, increased to 80mg   -- continue Zetia 10mg     CKD III -- baseline Cr around 1.5, up to 1.69>>1.1   Acute HFmrEF -- echo showed LVEF of 45-50%, g1DD normal RV. Cardiac MRI noted LVEF of 34% -- GDMT: continue coreg 12.5mg  BID, entresto 24/26, spiro 12.5mg  daily and jardiance    Adrenal Mass Sinus Tachycardia/ Labile  HTN -- noted on CT angio, incompletely visualized 5.2 cm left upper quadrant mass which appears to be associated with left adrenal gland, possible malignancy -- Estradiol,  testosterone, progesterone, ACTH stimulation test negative, Aldosterone/renin, metanephrines pending -- Abd MRI showed large left adrenal mass, measures 6.0 x 5.3 cm suspicious for a pheochromocytoma or adrenal cortical carcinoma. No evidence of metastatic disease in the abd.  -- plan for outpatient surgery consult (their office will call to arrange the first of next week)  PreDM -- Hgb A1c 6.3 -- added jardiance  -- follow up with PCP  Did the patient have an acute coronary syndrome (MI, NSTEMI, STEMI, etc) this admission?:  Yes                               AHA/ACC Clinical Performance & Quality Measures: Aspirin prescribed? - Yes ADP Receptor Inhibitor (Plavix/Clopidogrel, Brilinta/Ticagrelor or Effient/Prasugrel) prescribed (includes medically managed patients)? - No - no culprit lesion or intervention Beta Blocker prescribed? - Yes High Intensity Statin (Lipitor 40-80mg  or Crestor 20-40mg ) prescribed? - Yes EF assessed during THIS hospitalization? - Yes For EF <40%, was ACEI/ARB prescribed? - Yes For EF <40%, Aldosterone Antagonist (Spironolactone or Eplerenone) prescribed? - Yes Cardiac Rehab Phase II ordered (including medically managed patients)? - Yes   The patient will be scheduled for a TOC follow up appointment in 10-14 days.  A message has been sent to the [facility] and Scheduling Pool at the office where the patient should be seen for follow up.  _____________  Discharge Vitals Blood pressure (!) 130/95, pulse 99, temperature 98.7 F (37.1 C), temperature source Oral, resp. rate 18, height 6' (1.829 m), weight 74.4 kg, SpO2 95 %.  Filed Weights   03/02/23 0500 03/03/23 0325 03/04/23 0407  Weight: 76.5 kg 75.2 kg 74.4 kg    Labs & Radiologic Studies    CBC Recent Labs    03/02/23 1040  WBC 17.1*  HGB 15.5  HCT 45.0  MCV 87.7  PLT 388   Basic Metabolic Panel Recent Labs    16/10/96 1040 03/03/23 0556  NA 136 135  K 3.9 4.0  CL 101 101  CO2 24 22   GLUCOSE 178* 141*  BUN 15 19  CREATININE 1.15 1.21  CALCIUM 9.2 9.0   Liver Function Tests No results for input(s): "AST", "ALT", "ALKPHOS", "BILITOT", "PROT", "ALBUMIN" in the last 72 hours. No results for input(s): "LIPASE", "AMYLASE" in the last 72 hours. High Sensitivity Troponin:   Recent Labs  Lab 02/28/23 2115 02/28/23 2335  TROPONINIHS 294* 1,420*    BNP Invalid input(s): "POCBNP" D-Dimer Recent Labs    03/01/23 1806  DDIMER <0.27   Hemoglobin A1C No results for input(s): "HGBA1C" in the last 72 hours. Fasting Lipid Panel No results for input(s): "CHOL", "HDL", "LDLCALC", "TRIG", "CHOLHDL", "LDLDIRECT" in the last 72 hours. Thyroid Function Tests No results for input(s): "TSH", "T4TOTAL", "T3FREE", "THYROIDAB" in the last 72 hours.  Invalid input(s): "FREET3" _____________  MR ABDOMEN W WO CONTRAST  Result Date: 03/03/2023 CLINICAL DATA:  Left adrenal mass on chest CTA. No given history of malignancy. EXAM: MRI ABDOMEN WITHOUT AND WITH CONTRAST TECHNIQUE: Multiplanar multisequence MR imaging of the abdomen was performed both before and after the administration of intravenous contrast. CONTRAST:  7.73mL GADAVIST GADOBUTROL 1 MMOL/ML IV SOLN COMPARISON:  Chest CTA 03/01/2023. No other relevant prior imaging which includes the left adrenal gland available. FINDINGS: Lower chest: Mild dependent atelectasis at both lung bases. The visualized lower chest is otherwise unremarkable. Hepatobiliary: The liver is normal in morphology without evidence of cirrhosis or significant steatosis. There are no focal liver lesions. There is a 1.1 cm dependent gallstone. No evidence of gallbladder wall thickening, surrounding inflammation, choledocholithiasis or biliary dilatation. No evidence of gallstones, gallbladder wall thickening or biliary dilatation. Pancreas: Unremarkable. No pancreatic ductal dilatation or surrounding inflammatory changes. Spleen: Normal in size without focal  abnormality. Adrenals/Urinary Tract: As demonstrated on recent chest CTA, there is a large left adrenal mass. This measures 6.0 x 5.3 cm transverse on image 25/5 and 5.7 cm on coronal image 21/4. This mass demonstrates mildly heterogeneous T1 signal, more heterogeneous T2 hyperintensity, and heterogeneous arterial phase enhancement following contrast. There is a fat plane between this lesion and the adjacent kidney and pancreatic tail. The right adrenal gland appears normal. There are simple and mildly complex renal cysts bilaterally, including a simple cyst in the upper pole of the right kidney measuring 3.8 cm on image 25/5. In the interpolar region of the left kidney, there is a 2.2 cm cyst which contains a fluid-fluid level. No enhancing renal masses or hydronephrosis. No specific imaging follow-up of the renal lesions is recommended. Stomach/Bowel: The stomach appears unremarkable for its degree of distension. No evidence of bowel wall thickening, distention or surrounding inflammatory change. Vascular/Lymphatic: There are no enlarged abdominal lymph nodes. Aortic and branch vessel atherosclerosis without evidence of aneurysm. No evidence of renal vein or IVC tumor thrombus. The portal, superior mesenteric and splenic veins appear unremarkable. Other: No ascites or peritoneal nodularity. Musculoskeletal: No acute or significant osseous findings. No evidence of osseous metastatic disease. Mild lumbar spondylosis. IMPRESSION: 1. Large enhancing left adrenal mass, with imaging features suspicious for a pheochromocytoma or adrenal cortical carcinoma. Metastatic disease is considered less likely. Biochemical testing for possible pheochromocytoma recommended prior to tissue sampling. 2. No evidence of metastatic disease in the abdomen. 3. Simple and mildly complex renal cysts bilaterally. 4. Cholelithiasis without evidence of cholecystitis or biliary dilatation. Electronically Signed   By: Carey Bullocks M.D.   On:  03/03/2023 16:06   MR CARDIAC MORPHOLOGY W WO CONTRAST  Result Date: 03/02/2023 CLINICAL DATA:  41M p/w chest pain, troponin up to 1420. Cath shows nonobstructive CAD. Echo with EF 45-50%, with hypokinesis of mid segments EXAM: CARDIAC MRI TECHNIQUE: The patient was scanned on a 1.5 Tesla Siemens magnet. A dedicated cardiac coil was used. Functional imaging was done using Fiesta sequences. 2,3, and 4 chamber views were done to assess for RWMA's. Modified Simpson's rule using a short axis stack was used to calculate an ejection fraction on a dedicated work [employer]. The patient received 10 cc of Gadavist. After 10 minutes inversion recovery sequences were used to assess for infiltration and scar tissue. Phase contrast velocity mapping was performed above the aortic and pulmonic valves CONTRAST:  10 cc  of Gadavist FINDINGS: Left ventricle: -Normal size -Asymmetric hypertrophy measuring 17mm in basal septum (8mm in posterior wall) -Moderate  systolic dysfunction.  Hypokinesis of mid segments -Elevated ECV (30%).  Normal T2 values -RV insertion site LGE. LGE accounts for 2% of total myocardial mass LV EF: 34% (Normal 49-79%) Absolute volumes: LV EDV: (Normal 95-215 mL) LV ESV: (Normal 25-85 mL) LV SV: 60mL (Normal 61-145 mL) CO: 6.7L/min (Normal 3.4-7.8 L/min) Indexed volumes: LV EDV: 60mL/sq-m (Normal 50-108 mL/sq-m) LV ESV: 9mL/sq-m (Normal 11-47 mL/sq-m) LV SV: 8mL/sq-m (Normal 33-72 mL/sq-m) CI: 3.4L/min/sq-m (Normal 1.8-4.2 L/min/sq-m) Right ventricle: Normal size and systolic function RV EF:  50% (Normal 51-80%) Absolute volumes: RV EDV: (Normal 109-217 mL) RV ESV: 63mL (Normal 23-91 mL) RV SV: 63mL (Normal 71-141 mL) CO: 6.9L/min (Normal 2.8-8.8 L/min) Indexed volumes: RV EDV: 106mL/sq-m (Normal 58-109 mL/sq-m) RV ESV: 42mL/sq-m (Normal 12-46 mL/sq-m) RV SV: 88mL/sq-m (Normal 38-71 mL/sq-m) CI: 3.5L/min/sq-m (Normal 1.7-4.2 L/min/sq-m) Left atrium: Normal size Right  atrium: Normal size Mitral valve: Trivial regurgitation Aortic valve: Trivial regurgitation Tricuspid valve: Trivial regurgitation Pulmonic valve: Trivial regurgitation Aorta: Normal proximal ascending aorta Pericardium: Normal IMPRESSION: 1. There is hypokinesis of all LV mid ventricular segments, which considering presentation with troponin elevation and normal coronary arteries on cath, suggests mid ventricular Takotsubo cardiomyopathy 2. Asymmetric LV hypertrophy measuring 17mm in basal septum (8mm in posterior wall), meeting criteria for hypertrophic cardiomyopathy 3. RV insertion site LGE, which is commonly seen in HCM. LGE accounts for 2% of total myocardial mass 4.  Normal LV size with moderate systolic dysfunction (EF 34%) 5.  Normal RV size and systolic function (EF 50%) Electronically Signed   By: Epifanio Lesches M.D.   On: 03/02/2023 23:36   MR CARDIAC VELOCITY FLOW MAP  Result Date: 03/02/2023 CLINICAL DATA:  41M p/w chest pain, troponin up to 1420. Cath shows nonobstructive CAD. Echo with EF 45-50%, with hypokinesis of mid segments EXAM: CARDIAC MRI TECHNIQUE: The patient was scanned on a 1.5 Tesla Siemens magnet. A dedicated cardiac coil was used. Functional imaging was done using Fiesta sequences. 2,3, and 4 chamber views were done to assess for RWMA's. Modified Simpson's rule using a short axis stack was used to calculate an ejection fraction on a dedicated work [employer]. The patient received 10 cc of Gadavist. After 10 minutes inversion recovery sequences were used to assess for infiltration and scar tissue. Phase contrast velocity mapping was performed above the aortic and pulmonic valves CONTRAST:  10 cc  of Gadavist FINDINGS: Left ventricle: -Normal size -Asymmetric hypertrophy measuring 17mm in basal septum (8mm in posterior wall) -Moderate systolic dysfunction.  Hypokinesis of mid segments -Elevated ECV (30%).  Normal T2 values -RV insertion site LGE. LGE  accounts for 2% of total myocardial mass LV EF: 34% (Normal 49-79%) Absolute volumes: LV EDV: (Normal 95-215 mL) LV ESV: (Normal 25-85 mL) LV SV: 60mL (Normal 61-145 mL) CO: 6.7L/min (Normal 3.4-7.8 L/min) Indexed volumes: LV EDV: 53mL/sq-m (Normal 50-108 mL/sq-m) LV ESV: 23mL/sq-m (Normal 11-47 mL/sq-m) LV SV: 81mL/sq-m (Normal 33-72 mL/sq-m) CI: 3.4L/min/sq-m (Normal 1.8-4.2 L/min/sq-m) Right ventricle: Normal size and systolic function RV EF:  50% (Normal 51-80%) Absolute volumes: RV EDV: (Normal 109-217 mL) RV ESV: 63mL (Normal 23-91 mL) RV SV: 63mL (Normal 71-141 mL) CO: 6.9L/min (Normal 2.8-8.8 L/min) Indexed volumes: RV EDV: 66mL/sq-m (Normal 58-109 mL/sq-m) RV ESV: 57mL/sq-m (Normal 12-46 mL/sq-m) RV SV: 60mL/sq-m (Normal 38-71 mL/sq-m) CI: 3.5L/min/sq-m (Normal 1.7-4.2 L/min/sq-m) Left atrium: Normal size Right atrium: Normal size Mitral valve: Trivial regurgitation Aortic valve: Trivial regurgitation Tricuspid valve: Trivial regurgitation Pulmonic valve: Trivial regurgitation Aorta:  Normal proximal ascending aorta Pericardium: Normal IMPRESSION: 1. There is hypokinesis of all LV mid ventricular segments, which considering presentation with troponin elevation and normal coronary arteries on cath, suggests mid ventricular Takotsubo cardiomyopathy 2. Asymmetric LV hypertrophy measuring 17mm in basal septum (8mm in posterior wall), meeting criteria for hypertrophic cardiomyopathy 3. RV insertion site LGE, which is commonly seen in HCM. LGE accounts for 2% of total myocardial mass 4.  Normal LV size with moderate systolic dysfunction (EF 34%) 5.  Normal RV size and systolic function (EF 50%) Electronically Signed   By: Epifanio Lesches M.D.   On: 03/02/2023 23:36   MR CARDIAC VELOCITY FLOW MAP  Result Date: 03/02/2023 CLINICAL DATA:  17M p/w chest pain, troponin up to 1420. Cath shows nonobstructive CAD. Echo with EF 45-50%, with hypokinesis of mid segments EXAM: CARDIAC MRI  TECHNIQUE: The patient was scanned on a 1.5 Tesla Siemens magnet. A dedicated cardiac coil was used. Functional imaging was done using Fiesta sequences. 2,3, and 4 chamber views were done to assess for RWMA's. Modified Simpson's rule using a short axis stack was used to calculate an ejection fraction on a dedicated work [employer], [employer]. The patient received 10 cc of Gadavist. After 10 minutes inversion recovery sequences were used to assess for infiltration and scar tissue. Phase contrast velocity mapping was performed above the aortic and pulmonic valves CONTRAST:  10 cc  of Gadavist FINDINGS: Left ventricle: -Normal size -Asymmetric hypertrophy measuring 17mm in basal septum (8mm in posterior wall) -Moderate systolic dysfunction.  Hypokinesis of mid segments -Elevated ECV (30%).  Normal T2 values -RV insertion site LGE. LGE accounts for 2% of total myocardial mass LV EF: 34% (Normal 49-79%) Absolute volumes: LV EDV: (Normal 95-215 mL) LV ESV: (Normal 25-85 mL) LV SV: 60mL (Normal 61-145 mL) CO: 6.7L/min (Normal 3.4-7.8 L/min) Indexed volumes: LV EDV: 16mL/sq-m (Normal 50-108 mL/sq-m) LV ESV: 60mL/sq-m (Normal 11-47 mL/sq-m) LV SV: 37mL/sq-m (Normal 33-72 mL/sq-m) CI: 3.4L/min/sq-m (Normal 1.8-4.2 L/min/sq-m) Right ventricle: Normal size and systolic function RV EF:  50% (Normal 51-80%) Absolute volumes: RV EDV: (Normal 109-217 mL) RV ESV: 63mL (Normal 23-91 mL) RV SV: 63mL (Normal 71-141 mL) CO: 6.9L/min (Normal 2.8-8.8 L/min) Indexed volumes: RV EDV: 20mL/sq-m (Normal 58-109 mL/sq-m) RV ESV: 42mL/sq-m (Normal 12-46 mL/sq-m) RV SV: 80mL/sq-m (Normal 38-71 mL/sq-m) CI: 3.5L/min/sq-m (Normal 1.7-4.2 L/min/sq-m) Left atrium: Normal size Right atrium: Normal size Mitral valve: Trivial regurgitation Aortic valve: Trivial regurgitation Tricuspid valve: Trivial regurgitation Pulmonic valve: Trivial regurgitation Aorta: Normal proximal ascending aorta Pericardium: Normal IMPRESSION: 1.  There is hypokinesis of all LV mid ventricular segments, which considering presentation with troponin elevation and normal coronary arteries on cath, suggests mid ventricular Takotsubo cardiomyopathy 2. Asymmetric LV hypertrophy measuring 17mm in basal septum (8mm in posterior wall), meeting criteria for hypertrophic cardiomyopathy 3. RV insertion site LGE, which is commonly seen in HCM. LGE accounts for 2% of total myocardial mass 4.  Normal LV size with moderate systolic dysfunction (EF 34%) 5.  Normal RV size and systolic function (EF 50%) Electronically Signed   By: Epifanio Lesches M.D.   On: 03/02/2023 23:36   CT Angio Chest Pulmonary Embolism (PE) W or WO Contrast  Result Date: 03/01/2023 CLINICAL DATA:  Recurrent chest pain EXAM: CT ANGIOGRAPHY CHEST WITH CONTRAST TECHNIQUE: Multidetector CT imaging of the chest was performed using the standard protocol during bolus administration of intravenous contrast. Multiplanar CT image reconstructions and MIPs were obtained to evaluate the vascular anatomy. RADIATION DOSE REDUCTION:  This exam was performed according to the departmental dose-optimization program which includes automated exposure control, adjustment of the mA and/or kV according to patient size and/or use of iterative reconstruction technique. CONTRAST:  60mL OMNIPAQUE IOHEXOL 350 MG/ML SOLN COMPARISON:  Chest x-ray 02/28/2023 FINDINGS: Cardiovascular: Satisfactory opacification of the pulmonary arteries to the segmental level. No evidence of pulmonary embolism. Normal heart size. No pericardial effusion. Nonaneurysmal aorta. Moderate aortic atherosclerosis. Coronary vascular calcifications. Mediastinum/Nodes: Midline trachea. No thyroid mass. No suspicious lymph nodes. Esophagus within normal limits aside from small hiatal hernia. Mild distal esophageal thickening. Lungs/Pleura: Lungs are clear. No pleural effusion or pneumothorax. Upper Abdomen: Gallstones. Incompletely visualized left upper  quadrant mass measuring 5.2 x 4.4 cm with density values of 24, this appears to be associated with left adrenal gland. Musculoskeletal: No acute osseous abnormality Review of the MIP images confirms the above findings. IMPRESSION: 1. Negative for acute pulmonary embolus or aortic dissection. Clear lung fields. 2. 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 Surgical consultation is recommended. Consider biochemical lab evaluation for functional status and pheochromocytoma prior to resection. 3. Gallstones. 4. Aortic atherosclerosis. Aortic Atherosclerosis (ICD10-I70.0). Electronically Signed   By: Jasmine Pang M.D.   On: 03/01/2023 18:52   ECHOCARDIOGRAM COMPLETE  Result Date: 03/01/2023    ECHOCARDIOGRAM REPORT   Patient Name:   Shane Sampson Date of Exam: 03/01/2023 Medical Rec #:  [id number]     Height:       72.0 in Accession #:    [id number]    Weight:       172.6 lb Date of Birth:  06-Nov-1955     BSA:          2.001 m Patient Age:    67 years      BP:           132/78 mmHg Patient Gender: M             HR:           93 bpm. Exam Location:  Inpatient Procedure: 2D Echo, Cardiac Doppler and Color Doppler Indications:    Acute ischemic heart disease  History:        Patient has prior history of Echocardiogram examinations. Risk                 Factors:Hypertension.  Sonographer:    Mike Gip Referring Phys: [id number] Donnie Coffin VARANASI IMPRESSIONS  1. Atypical pattern of abnormal wall motion involving the mid segments of the heart with preserved apical contractility. 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 Cannot exclude myocarditis. Left ventricular ejection fraction, by estimation, is 45 to 50%. The left ventricle has mildly decreased function. The left ventricle demonstrates regional wall motion abnormalities (see scoring diagram/findings for description). Left ventricular diastolic parameters are  consistent with Grade I diastolic  dysfunction (impaired relaxation).  2. Right ventricular systolic function is normal. The right ventricular size is normal. Tricuspid regurgitation signal is inadequate for assessing PA pressure.  3. Left atrial size was mildly dilated.  4. The mitral valve is normal in structure. No evidence of mitral valve regurgitation. No evidence of mitral stenosis.  5. The aortic valve is tricuspid. Aortic valve regurgitation is not visualized. No aortic stenosis is present. Comparison(s): Prior images reviewed side by side. The left ventricular function is worsened. The left ventricular diastolic function has improved. The left ventricular wall motion abnormalities are new. FINDINGS  Left Ventricle: Atypical pattern of abnormal wall motion involving the mid segments of the heart with preserved apical contractility. 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 Cannot exclude myocarditis. Left ventricular ejection fraction, by estimation, is 45 to 50%. The left ventricle has mildly decreased function. The left ventricle demonstrates regional wall motion abnormalities. The left ventricular internal cavity size was normal in size. There is no left ventricular hypertrophy. Left ventricular diastolic parameters are consistent with Grade I diastolic dysfunction (impaired relaxation). Normal left ventricular filling pressure.  LV Wall Scoring: The mid anteroseptal segment, mid inferolateral segment, mid anterolateral segment, mid inferoseptal segment, mid anterior segment, and mid inferior segment are hypokinetic. The entire apex, basal anteroseptal segment, basal inferolateral segment, basal anterolateral segment, basal anterior segment, basal inferior segment, and basal inferoseptal segment are normal. Right Ventricle: The right ventricular size is normal. No increase in right ventricular wall thickness. Right ventricular systolic function is normal. Tricuspid regurgitation signal is  inadequate for assessing PA pressure. Left Atrium: Left atrial size was mildly dilated. Right Atrium: Right atrial size was normal in size. Pericardium: There is no evidence of pericardial effusion. Mitral Valve: The mitral valve is normal in structure. No evidence of mitral valve regurgitation. No evidence of mitral valve stenosis. Tricuspid Valve: The tricuspid valve is normal in structure. Tricuspid valve regurgitation is not demonstrated. Aortic Valve: The aortic valve is tricuspid. Aortic valve regurgitation is not visualized. No aortic stenosis is present. Pulmonic Valve: The pulmonic valve was grossly normal. Pulmonic valve regurgitation is not visualized. Aorta: The aortic root and ascending aorta are structurally normal, with no evidence of dilitation. IAS/Shunts: No atrial level shunt detected by color flow Doppler.  LEFT VENTRICLE PLAX 2D LVIDd:         4.60 cm      Diastology LVIDs:         3.30 cm      LV e' medial:    5.77 cm/s LV PW:         1.00 cm      LV E/e' medial:  8.9 LV IVS:        1.10 cm      LV e' lateral:   8.81 cm/s                             LV E/e' lateral: 5.8  LV Volumes (MOD) LV vol d, MOD A2C: 104.0 ml LV vol d, MOD A4C: 116.0 ml LV vol s, MOD A2C: 51.8 ml LV vol s, MOD A4C: 66.8 ml LV SV MOD A2C:     52.2 ml LV SV MOD A4C:     116.0 ml LV SV MOD BP:      52.6 ml RIGHT VENTRICLE RV Basal diam:  3.80 cm RV S prime:     12.50 cm/s TAPSE (M-mode): 1.8 cm LEFT ATRIUM             Index        RIGHT ATRIUM           Index LA diam:        4.20 cm 2.10 cm/m   RA Area:     11.80 cm LA Vol (A2C):   46.1 ml 23.04 ml/m  RA Volume:   21.70 ml  10.84 ml/m LA Vol (A4C):   59.3 ml 29.63 ml/m LA Biplane Vol: 57.0 ml 28.48 ml/m  AORTIC VALVE LVOT Vmax:   123.00 cm/s LVOT Vmean:  86.200 cm/s LVOT VTI:    0.208 m  AORTA Ao Asc diam: 3.30 cm MITRAL VALVE MV Area (PHT): 5.64 cm     SHUNTS MV E velocity: 51.40 cm/s   Systemic VTI: 0.21 m MV A velocity: 109.00 cm/s MV E/A ratio:  0.47 Mihai  Croitoru MD Electronically signed by Thurmon Fair MD Signature Date/Time: 03/01/2023/4:28:00 PM    Final    CARDIAC CATHETERIZATION  Result Date: 03/01/2023   Mid LAD lesion is 30% stenosed.  Diffuse mild to moderate CAD.   LV end diastolic pressure is normal.   There is no aortic valve stenosis.   Tortuous right subclavian requiring 85 cm destination sheath. Mild to moderate diffuse coronary disease in the major epicardial vessels.  No culprit lesion found.  Patient had a total of 45 minutes of chest pain.  No return of symptoms.  Possibilities include small vessel disease, vasospasm or myocarditis.  Starting heparin 2 hours after TR band removal.  Discussed with the patient's wife, Shane Sampson.   DG Chest Port 1 View  Result Date: 02/28/2023 CLINICAL DATA:  Chest pain EXAM: PORTABLE CHEST 1 VIEW COMPARISON:  None Available. FINDINGS: The heart size and mediastinal contours are within normal limits. Both lungs are clear. The visualized skeletal structures are unremarkable. IMPRESSION: No active disease. Electronically Signed   By: Helyn Numbers M.D.   On: 02/28/2023 21:50   Disposition   Pt is being discharged home today in good condition.  Follow-up Plans & Appointments     Follow-up Information     Sur[facility], [facility] Follow up in 1 month(s).   Specialty: General Surgery Why: Office will call you with a follow up appointment, If you don't hear from the office, please call.  This is for follow up of your adrenal mass Contact information: [address] [place] [address] [phone]         Carlos Levering, NP Follow up on 03/11/2023.   Specialty: Cardiology Why: at 2:20pm for your follow up appt with cardiology Contact information: [address] [place] [address] [phone]                Discharge Instructions     Amb Referral to Cardiac Rehabilitation   Complete by: As directed    Diagnosis: NSTEMI   After initial  evaluation and assessments completed: Virtual Based Care may be provided alone or in conjunction with Phase 2 Cardiac Rehab based on patient barriers.: Yes   Intensive Cardiac Rehabilitation (ICR) [facility] location only OR Traditional Cardiac Rehabilitation (TCR) *If criteria for ICR are not met will enroll in TCR [facility] only): Yes   Call MD for:  difficulty breathing, headache or visual disturbances   Complete by: As directed    Call MD for:  persistant dizziness or light-headedness   Complete by: As directed    Call MD for:  redness, tenderness, or signs of infection (pain, swelling, redness, odor or Ballman/yellow discharge around incision site)   Complete by: As directed    Diet - low sodium heart healthy   Complete by: As directed    Discharge instructions   Complete by: As directed    Radial Site Care Refer to this sheet in the next few weeks. These instructions provide you with information on caring for yourself after your procedure. Your caregiver may also give you more specific instructions. Your treatment has been planned according to current medical practices, but problems sometimes occur. Call your caregiver if you  have any problems or questions after your procedure. HOME CARE INSTRUCTIONS You may shower the day after the procedure. Remove the bandage (dressing) and gently wash the site with plain soap and water. Gently pat the site dry.  Do not apply powder or lotion to the site.  Do not submerge the affected site in water for 3 to 5 days.  Inspect the site at least twice daily.  Do not flex or bend the affected arm for 24 hours.  No lifting over 5 pounds (2.3 kg) for 5 days after your procedure.  Do not drive home if you are discharged the same day of the procedure. Have someone else drive you.  You may drive 24 hours after the procedure unless otherwise instructed by your caregiver.  What to expect: Any bruising will usually fade within 1 to 2 weeks.  Blood that collects in the tissue  (hematoma) may be painful to the touch. It should usually decrease in size and tenderness within 1 to 2 weeks.  SEEK IMMEDIATE MEDICAL CARE IF: You have unusual pain at the radial site.  You have redness, warmth, swelling, or pain at the radial site.  You have drainage (other than a small amount of blood on the dressing).  You have chills.  You have a fever or persistent symptoms for more than 72 hours.  You have a fever and your symptoms suddenly get worse.  Your arm becomes pale, cool, tingly, or numb.  You have heavy bleeding from the site. Hold pressure on the site.   Increase activity slowly   Complete by: As directed         Discharge Medications   Allergies as of 03/04/2023   No Known Allergies      Medication List     STOP taking these medications    amLODipine 10 MG tablet Commonly known as: NORVASC   enalapril 20 MG tablet Commonly known as: VASOTEC   hydrALAZINE 50 MG tablet Commonly known as: APRESOLINE       TAKE these medications    aspirin 81 MG tablet Take 81 mg by mouth daily.   atorvastatin 80 MG tablet Commonly known as: LIPITOR Take 1 tablet (80 mg total) by mouth daily. Start taking on: Mar 05, 2023 What changed:  medication strength how much to take   carvedilol 12.5 MG tablet Commonly known as: COREG Take 1 tablet (12.5 mg total) by mouth 2 (two) times daily.   empagliflozin 10 MG Tabs tablet Commonly known as: JARDIANCE Take 1 tablet (10 mg total) by mouth daily. Start taking on: Mar 05, 2023   ezetimibe 10 MG tablet Commonly known as: ZETIA Take 10 mg by mouth daily.   nitroGLYCERIN 0.4 MG SL tablet Commonly known as: NITROSTAT Place 1 tablet (0.4 mg total) under the tongue every 5 (five) minutes x 3 doses as needed for chest pain.   sacubitril-valsartan 24-26 MG Commonly known as: ENTRESTO Take 1 tablet by mouth 2 (two) times daily.   spironolactone 25 MG tablet Commonly known as: ALDACTONE Take 0.5 tablets (12.5 mg  total) by mouth daily. Start taking on: Mar 05, 2023   tamsulosin 0.4 MG Caps capsule Commonly known as: FLOMAX Take 0.4 mg by mouth daily.          Outstanding Labs/Studies   FLP/LFTs in 8 weeks BMET at follow up  Duration of Discharge Encounter   Greater than 30 minutes including physician time.  Signed, Laverda Page, NP 03/04/2023, 10:38 AM  Agree with  note by Laverda Page NP-C  Patient admitted with non-STEMI.  Troponins went up to about 1400.  Cath revealed essentially normal coronary arteries with moderate LV dysfunction.  2D echo suggested Takotsubo and cardiac MRI confirmed although the EF by MRI was mildly lower in the 35% range.  He is on guideline directed optimal medical therapy.  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  Hopefully his LV function will improve over time on appropriate medications.  He stable for discharge.  Runell Gess, M.D., FACP, [facility], [facility] [address] [place], [place]  [address]  [phone] 03/04/2023 11:31 AM

## 2023-03-04 NOTE — Telephone Encounter (Signed)
   Transition of Care Follow-up Phone Call Request    Patient Name: Shane Sampson Date of Birth: 04/02/1956 Date of Encounter: 03/04/2023  Primary Care Provider:  Rodrigo Ran, MD Primary Cardiologist:  Little Ishikawa, MD  Shane Sampson has been scheduled for a transition of care follow up appointment with a [facility] provider:  Carlos Levering 5/31 2:20pm   Please reach out to Shane Sampson within 48 hours to confirm appointment and review transition of care protocol questionnaire.  Laverda Page, NP  03/04/2023, 10:31 AM

## 2023-03-04 NOTE — Progress Notes (Signed)
CARDIAC REHAB PHASE I   Pt feeling well and ready for discharge home today. Post MI education completed 03-03-23. All questions and concerns addressed. Referral sent to [facility] for CRP2.        Woodroe Chen, RN BSN 03/04/2023 1100

## 2023-03-04 NOTE — Progress Notes (Signed)
PROGRESS NOTE    Shane Sampson  ZOX:[id number] DOB: 26-Sep-1956 DOA: 02/28/2023 PCP: Rodrigo Ran, MD    Brief Narrative:  67 year old gentleman with history of hypertension, hyperlipidemia, BPH admitted on 5/21 with chest pain and elevated troponins.  He underwent cardiac cath that was negative.  Suspected Takotsubo.  MRI consistent with Takotsubo cardiomyopathy.  Currently stabilizing on treatment. CT angiogram of the chest also noted with mass on the left adrenal gland mass,  Serological test pending.   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 Hormonal assays including Cortisol, normal.  ACTH stimulation test with normal findings. Estradiol, within normal limits Progesterone, normal Testosterone, normal metanephrine and renin aldosterone testing pending.  Will need follow-up. Case discussed with [facility] surgery, they will schedule follow-up for surgery but will need 6 to 8 weeks of recovery from stress cardiomyopathy.  Non-STEMI/Takotsubo cardiomyopathy: Presented with chest pain, indigestion and flushing.  Troponin significantly elevated.  Cardiac cath negative for coronary artery disease.  Management as per cardiology.  Leukocytosis: Chronic. BPH, on tamsulosin Dyslipidemia, on atorvastatin and Zetia Pre diabetes,  diet exercise discussed.  Going home on [place]. CKD stage IIIa, at about baseline. Essential hypertension, blood pressure stable on amlodipine, carvedilol, enalapril and hydralazine.  Thank you for involving [place] in this patient's care.   Case discussed with surgery and updated to schedule follow-up.  Stable for discharge from medical side.  DVT prophylaxis: SCDs   Code Status: Full code Family Communication: Wife at the bedside.  Sister on the phone. Disposition Plan: Status is: Inpatient Remains inpatient appropriate because: Possible discharge today.     Consultants:   TR[facility]  Procedures:  Cardiac cath  Antimicrobials:  None   Subjective:  Seen in the morning rounds.  No events.  Discussed about MRI results and referral process.  Objective: Vitals:   03/04/23 0408 03/04/23 0409 03/04/23 0812 03/04/23 1000  BP:   127/83 (!) 130/95  Pulse: 80 77 96 99  Resp:   18   Temp:   98.7 F (37.1 C)   TempSrc:   Oral   SpO2: 96% 95% 95%   Weight:      Height:        Intake/Output Summary (Last 24 hours) at 03/04/2023 1025 Last data filed at 03/04/2023 0818 Gross per 24 hour  Intake 240 ml  Output --  Net 240 ml    Filed Weights   03/02/23 0500 03/03/23 0325 03/04/23 0407  Weight: 76.5 kg 75.2 kg 74.4 kg    Examination:  General: Looks comfortable. Cardiovascular: S1-S2 normal.  Regular rate rhythm. Respiratory: Bilateral clear.  No added sounds. Gastrointestinal: Soft.  Nontender.  Bowel sound present. Ext: No edema.  No cyanosis. Neuro: Alert awake oriented.      Data Reviewed: I have personally reviewed following labs and imaging studies  CBC: Recent Labs  Lab 02/28/23 2115 02/28/23 2134 03/01/23 0124 03/02/23 1040  WBC 16.5*  --  15.5* 17.1*  HGB 14.7 14.6 13.9 15.5  HCT 44.1 43.0 41.7 45.0  MCV 89.3  --  89.9 87.7  PLT 444*  --  426* 388    Basic Metabolic Panel: Recent Labs  Lab 02/28/23 2115 02/28/23 2134 03/01/23 0124 03/02/23 1040 03/03/23 0556  NA 138 138 138 136 135  K 4.6 4.5 4.1 3.9 4.0  CL 104 103 104 101 101  CO2 23  --  23 24 22  GLUCOSE 244* 235* 149* 178* 141*  BUN 25* 26* 27* 15 19  CREATININE 1.61* 1.50* 1.69* 1.15 1.21  CALCIUM 9.5  --  9.3 9.2 9.0    GFR: Estimated Creatinine Clearance: 61.5 mL/min (by C-G formula based on SCr of 1.21 mg/dL). Liver Function Tests: Recent Labs  Lab 03/01/23 0124  AST 22  ALT 24  ALKPHOS 74  BILITOT 1.2  PROT 6.5  ALBUMIN 3.7    No results for input(s): "LIPASE", "AMYLASE" in the last 168 hours. No results for input(s): "AMMONIA" in the last  168 hours. Coagulation Profile: Recent Labs  Lab 03/01/23 0124  INR 1.2    Cardiac Enzymes: No results for input(s): "CKTOTAL", "CKMB", "CKMBINDEX", "TROPONINI" in the last 168 hours. BNP (last 3 results) No results for input(s): "PROBNP" in the last 8760 hours. HbA1C: No results for input(s): "HGBA1C" in the last 72 hours.  CBG: No results for input(s): "GLUCAP" in the last 168 hours. Lipid Profile: No results for input(s): "CHOL", "HDL", "LDLCALC", "TRIG", "CHOLHDL", "LDLDIRECT" in the last 72 hours.  Thyroid Function Tests: No results for input(s): "TSH", "T4TOTAL", "FREET4", "T3FREE", "THYROIDAB" in the last 72 hours. Anemia Panel: No results for input(s): "VITAMINB12", "FOLATE", "FERRITIN", "TIBC", "IRON", "RETICCTPCT" in the last 72 hours. Sepsis Labs: No results for input(s): "PROCALCITON", "LATICACIDVEN" in the last 168 hours.  No results found for this or any previous visit (from the past 240 hour(s)).       Radiology Studies: MR ABDOMEN W WO CONTRAST  Result Date: 03/03/2023 CLINICAL DATA:  Left adrenal mass on chest CTA. No given history of malignancy. EXAM: MRI ABDOMEN WITHOUT AND WITH CONTRAST TECHNIQUE: Multiplanar multisequence MR imaging of the abdomen was performed both before and after the administration of intravenous contrast. CONTRAST:  7.48mL GADAVIST GADOBUTROL 1 MMOL/ML IV SOLN COMPARISON:  Chest CTA 03/01/2023. No other relevant prior imaging which includes the left adrenal gland available. FINDINGS: Lower chest: Mild dependent atelectasis at both lung bases. The visualized lower chest is otherwise unremarkable. Hepatobiliary: The liver is normal in morphology without evidence of cirrhosis or significant steatosis. There are no focal liver lesions. There is a 1.1 cm dependent gallstone. No evidence of gallbladder wall thickening, surrounding inflammation, choledocholithiasis or biliary dilatation. No evidence of gallstones, gallbladder wall thickening or  biliary dilatation. Pancreas: Unremarkable. No pancreatic ductal dilatation or surrounding inflammatory changes. Spleen: Normal in size without focal abnormality. Adrenals/Urinary Tract: As demonstrated on recent chest CTA, there is a large left adrenal mass. This measures 6.0 x 5.3 cm transverse on image 25/5 and 5.7 cm on coronal image 21/4. This mass demonstrates mildly heterogeneous T1 signal, more heterogeneous T2 hyperintensity, and heterogeneous arterial phase enhancement following contrast. There is a fat plane between this lesion and the adjacent kidney and pancreatic tail. The right adrenal gland appears normal. There are simple and mildly complex renal cysts bilaterally, including a simple cyst in the upper pole of the right kidney measuring 3.8 cm on image 25/5. In the interpolar region of the left kidney, there is a 2.2 cm cyst which contains a fluid-fluid level. No enhancing renal masses or hydronephrosis. No specific imaging follow-up of the renal lesions is recommended. Stomach/Bowel: The stomach appears unremarkable for its degree of distension. No evidence of bowel wall thickening, distention or surrounding inflammatory change. Vascular/Lymphatic: There are no enlarged abdominal lymph nodes. Aortic and branch vessel atherosclerosis without evidence of aneurysm. No evidence of renal vein or IVC tumor thrombus. The portal, superior mesenteric and splenic veins appear  unremarkable. Other: No ascites or peritoneal nodularity. Musculoskeletal: No acute or significant osseous findings. No evidence of osseous metastatic disease. Mild lumbar spondylosis. IMPRESSION: 1. Large enhancing left adrenal mass, with imaging features suspicious for a pheochromocytoma or adrenal cortical carcinoma. Metastatic disease is considered less likely. Biochemical testing for possible pheochromocytoma recommended prior to tissue sampling. 2. No evidence of metastatic disease in the abdomen. 3. Simple and mildly complex renal  cysts bilaterally. 4. Cholelithiasis without evidence of cholecystitis or biliary dilatation. Electronically Signed   By: Carey Bullocks M.D.   On: 03/03/2023 16:06   MR CARDIAC MORPHOLOGY W WO CONTRAST  Result Date: 03/02/2023 CLINICAL DATA:  48M p/w chest pain, troponin up to 1420. Cath shows nonobstructive CAD. Echo with EF 45-50%, with hypokinesis of mid segments EXAM: CARDIAC MRI TECHNIQUE: The patient was scanned on a 1.5 Tesla Siemens magnet. A dedicated cardiac coil was used. Functional imaging was done using Fiesta sequences. 2,3, and 4 chamber views were done to assess for RWMA's. Modified Simpson's rule using a short axis stack was used to calculate an ejection fraction on a dedicated work [employer], [employer]. The patient received 10 cc of Gadavist. After 10 minutes inversion recovery sequences were used to assess for infiltration and scar tissue. Phase contrast velocity mapping was performed above the aortic and pulmonic valves CONTRAST:  10 cc  of Gadavist FINDINGS: Left ventricle: -Normal size -Asymmetric hypertrophy measuring 17mm in basal septum (8mm in posterior wall) -Moderate systolic dysfunction.  Hypokinesis of mid segments -Elevated ECV (30%).  Normal T2 values -RV insertion site LGE. LGE accounts for 2% of total myocardial mass LV EF: 34% (Normal 49-79%) Absolute volumes: LV EDV: (Normal 95-215 mL) LV ESV: (Normal 25-85 mL) LV SV: 60mL (Normal 61-145 mL) CO: 6.7L/min (Normal 3.4-7.8 L/min) Indexed volumes: LV EDV: 39mL/sq-m (Normal 50-108 mL/sq-m) LV ESV: 25mL/sq-m (Normal 11-47 mL/sq-m) LV SV: 64mL/sq-m (Normal 33-72 mL/sq-m) CI: 3.4L/min/sq-m (Normal 1.8-4.2 L/min/sq-m) Right ventricle: Normal size and systolic function RV EF:  50% (Normal 51-80%) Absolute volumes: RV EDV: (Normal 109-217 mL) RV ESV: 63mL (Normal 23-91 mL) RV SV: 63mL (Normal 71-141 mL) CO: 6.9L/min (Normal 2.8-8.8 L/min) Indexed volumes: RV EDV: 72mL/sq-m (Normal 58-109 mL/sq-m) RV ESV:  29mL/sq-m (Normal 12-46 mL/sq-m) RV SV: 39mL/sq-m (Normal 38-71 mL/sq-m) CI: 3.5L/min/sq-m (Normal 1.7-4.2 L/min/sq-m) Left atrium: Normal size Right atrium: Normal size Mitral valve: Trivial regurgitation Aortic valve: Trivial regurgitation Tricuspid valve: Trivial regurgitation Pulmonic valve: Trivial regurgitation Aorta: Normal proximal ascending aorta Pericardium: Normal IMPRESSION: 1. There is hypokinesis of all LV mid ventricular segments, which considering presentation with troponin elevation and normal coronary arteries on cath, suggests mid ventricular Takotsubo cardiomyopathy 2. Asymmetric LV hypertrophy measuring 17mm in basal septum (8mm in posterior wall), meeting criteria for hypertrophic cardiomyopathy 3. RV insertion site LGE, which is commonly seen in HCM. LGE accounts for 2% of total myocardial mass 4.  Normal LV size with moderate systolic dysfunction (EF 34%) 5.  Normal RV size and systolic function (EF 50%) Electronically Signed   By: Epifanio Lesches M.D.   On: 03/02/2023 23:36   MR CARDIAC VELOCITY FLOW MAP  Result Date: 03/02/2023 CLINICAL DATA:  48M p/w chest pain, troponin up to 1420. Cath shows nonobstructive CAD. Echo with EF 45-50%, with hypokinesis of mid segments EXAM: CARDIAC MRI TECHNIQUE: The patient was scanned on a 1.5 Tesla Siemens magnet. A dedicated cardiac coil was used. Functional imaging was done using Fiesta sequences. 2,3, and 4 chamber views were done to assess  for RWMA's. Modified Simpson's rule using a short axis stack was used to calculate an ejection fraction on a dedicated work [employer] officer, [employer]. The patient received 10 cc of Gadavist. After 10 minutes inversion recovery sequences were used to assess for infiltration and scar tissue. Phase contrast velocity mapping was performed above the aortic and pulmonic valves CONTRAST:  10 cc  of Gadavist FINDINGS: Left ventricle: -Normal size -Asymmetric hypertrophy measuring 17mm in basal septum (8mm in  posterior wall) -Moderate systolic dysfunction.  Hypokinesis of mid segments -Elevated ECV (30%).  Normal T2 values -RV insertion site LGE. LGE accounts for 2% of total myocardial mass LV EF: 34% (Normal 49-79%) Absolute volumes: LV EDV: (Normal 95-215 mL) LV ESV: (Normal 25-85 mL) LV SV: 60mL (Normal 61-145 mL) CO: 6.7L/min (Normal 3.4-7.8 L/min) Indexed volumes: LV EDV: 27mL/sq-m (Normal 50-108 mL/sq-m) LV ESV: 66mL/sq-m (Normal 11-47 mL/sq-m) LV SV: 74mL/sq-m (Normal 33-72 mL/sq-m) CI: 3.4L/min/sq-m (Normal 1.8-4.2 L/min/sq-m) Right ventricle: Normal size and systolic function RV EF:  50% (Normal 51-80%) Absolute volumes: RV EDV: (Normal 109-217 mL) RV ESV: 63mL (Normal 23-91 mL) RV SV: 63mL (Normal 71-141 mL) CO: 6.9L/min (Normal 2.8-8.8 L/min) Indexed volumes: RV EDV: 61mL/sq-m (Normal 58-109 mL/sq-m) RV ESV: 14mL/sq-m (Normal 12-46 mL/sq-m) RV SV: 81mL/sq-m (Normal 38-71 mL/sq-m) CI: 3.5L/min/sq-m (Normal 1.7-4.2 L/min/sq-m) Left atrium: Normal size Right atrium: Normal size Mitral valve: Trivial regurgitation Aortic valve: Trivial regurgitation Tricuspid valve: Trivial regurgitation Pulmonic valve: Trivial regurgitation Aorta: Normal proximal ascending aorta Pericardium: Normal IMPRESSION: 1. There is hypokinesis of all LV mid ventricular segments, which considering presentation with troponin elevation and normal coronary arteries on cath, suggests mid ventricular Takotsubo cardiomyopathy 2. Asymmetric LV hypertrophy measuring 17mm in basal septum (8mm in posterior wall), meeting criteria for hypertrophic cardiomyopathy 3. RV insertion site LGE, which is commonly seen in HCM. LGE accounts for 2% of total myocardial mass 4.  Normal LV size with moderate systolic dysfunction (EF 34%) 5.  Normal RV size and systolic function (EF 50%) Electronically Signed   By: Epifanio Lesches M.D.   On: 03/02/2023 23:36   MR CARDIAC VELOCITY FLOW MAP  Result Date: 03/02/2023 CLINICAL DATA:  49M p/w  chest pain, troponin up to 1420. Cath shows nonobstructive CAD. Echo with EF 45-50%, with hypokinesis of mid segments EXAM: CARDIAC MRI TECHNIQUE: The patient was scanned on a 1.5 Tesla Siemens magnet. A dedicated cardiac coil was used. Functional imaging was done using Fiesta sequences. 2,3, and 4 chamber views were done to assess for RWMA's. Modified Simpson's rule using a short axis stack was used to calculate an ejection fraction on a dedicated work [employer] officer, [employer]. The patient received 10 cc of Gadavist. After 10 minutes inversion recovery sequences were used to assess for infiltration and scar tissue. Phase contrast velocity mapping was performed above the aortic and pulmonic valves CONTRAST:  10 cc  of Gadavist FINDINGS: Left ventricle: -Normal size -Asymmetric hypertrophy measuring 17mm in basal septum (8mm in posterior wall) -Moderate systolic dysfunction.  Hypokinesis of mid segments -Elevated ECV (30%).  Normal T2 values -RV insertion site LGE. LGE accounts for 2% of total myocardial mass LV EF: 34% (Normal 49-79%) Absolute volumes: LV EDV: (Normal 95-215 mL) LV ESV: (Normal 25-85 mL) LV SV: 60mL (Normal 61-145 mL) CO: 6.7L/min (Normal 3.4-7.8 L/min) Indexed volumes: LV EDV: 21mL/sq-m (Normal 50-108 mL/sq-m) LV ESV: 49mL/sq-m (Normal 11-47 mL/sq-m) LV SV: 51mL/sq-m (Normal 33-72 mL/sq-m) CI: 3.4L/min/sq-m (Normal 1.8-4.2 L/min/sq-m) Right ventricle: Normal size and  systolic function RV EF:  50% (Normal 51-80%) Absolute volumes: RV EDV: (Normal 109-217 mL) RV ESV: 63mL (Normal 23-91 mL) RV SV: 63mL (Normal 71-141 mL) CO: 6.9L/min (Normal 2.8-8.8 L/min) Indexed volumes: RV EDV: 23mL/sq-m (Normal 58-109 mL/sq-m) RV ESV: 35mL/sq-m (Normal 12-46 mL/sq-m) RV SV: 58mL/sq-m (Normal 38-71 mL/sq-m) CI: 3.5L/min/sq-m (Normal 1.7-4.2 L/min/sq-m) Left atrium: Normal size Right atrium: Normal size Mitral valve: Trivial regurgitation Aortic valve: Trivial regurgitation Tricuspid valve:  Trivial regurgitation Pulmonic valve: Trivial regurgitation Aorta: Normal proximal ascending aorta Pericardium: Normal IMPRESSION: 1. There is hypokinesis of all LV mid ventricular segments, which considering presentation with troponin elevation and normal coronary arteries on cath, suggests mid ventricular Takotsubo cardiomyopathy 2. Asymmetric LV hypertrophy measuring 17mm in basal septum (8mm in posterior wall), meeting criteria for hypertrophic cardiomyopathy 3. RV insertion site LGE, which is commonly seen in HCM. LGE accounts for 2% of total myocardial mass 4.  Normal LV size with moderate systolic dysfunction (EF 34%) 5.  Normal RV size and systolic function (EF 50%) Electronically Signed   By: Epifanio Lesches M.D.   On: 03/02/2023 23:36        Scheduled Meds:  aspirin EC  81 mg Oral Daily   atorvastatin  80 mg Oral Daily   carvedilol  12.5 mg Oral BID WC   empagliflozin  10 mg Oral Daily   ezetimibe  10 mg Oral Daily   sacubitril-valsartan  1 tablet Oral BID   sodium chloride flush  3 mL Intravenous Q12H   spironolactone  12.5 mg Oral Daily   tamsulosin  0.4 mg Oral Daily   Continuous Infusions:  sodium chloride       LOS: 3 days    Time spent: 35 minutes    Dorcas Carrow, MD [facility] Pager [phone]

## 2023-03-07 LAB — ANDROSTENEDIONE: Androstenedione: 158 ng/dL — ABNORMAL HIGH (ref 22–96)

## 2023-03-08 LAB — ALDOSTERONE + RENIN ACTIVITY W/ RATIO
ALDO / PRA Ratio: 3.3 (ref 0.0–30.0)
Aldosterone: 6 ng/dL (ref 0.0–30.0)
PRA LC/MS/MS: 1.819 ng/mL/hr (ref 0.167–5.380)

## 2023-03-08 NOTE — Telephone Encounter (Signed)
Called patient, LVM to call back to discuss.  Left call back number.   

## 2023-03-11 ENCOUNTER — Encounter: Payer: Self-pay | Admitting: Student

## 2023-03-11 ENCOUNTER — Ambulatory Visit: Payer: 59 | Attending: Student | Admitting: Student

## 2023-03-11 VITALS — BP 100/60 | HR 89 | Ht 72.0 in | Wt 166.8 lb

## 2023-03-11 DIAGNOSIS — I5181 Takotsubo syndrome: Secondary | ICD-10-CM

## 2023-03-11 DIAGNOSIS — I10 Essential (primary) hypertension: Secondary | ICD-10-CM | POA: Diagnosis not present

## 2023-03-11 DIAGNOSIS — I2510 Atherosclerotic heart disease of native coronary artery without angina pectoris: Secondary | ICD-10-CM | POA: Diagnosis not present

## 2023-03-11 DIAGNOSIS — E785 Hyperlipidemia, unspecified: Secondary | ICD-10-CM | POA: Diagnosis not present

## 2023-03-11 MED ORDER — CARVEDILOL 6.25 MG PO TABS: 6.2500 mg | ORAL_TABLET | Freq: Two times a day (BID) | ORAL | 5 refills | Status: DC

## 2023-03-11 NOTE — Progress Notes (Signed)
Cardiology Clinic Note   Date: 03/11/2023 ID: Shane, Sampson April 09, 1956, MRN [id number]  Primary Cardiologist:  Little Ishikawa, MD  Patient Profile    Shane Sampson is a 67 y.o. male who presents to the clinic today for hospital follow up.   Past medical history significant for: CAD. LHC 03/01/2023 (NSTEMI): Mild to moderate diffuse coronary disease in the major epicardial vessels.  No culprit lesion found.  Possibility of small vessel disease, vasospasm or myocarditis. HFmrEF/Takotsubo cardiomyopathy. Echo 03/01/2023: EF 45 to 50%.  Atypical pattern of abnormal wall motion involving the mid segments of the heart with preserved apical contractility, suspect midcavity variant of Takotsubo syndrome.  Grade I DD.  Normal RV function mild LAE. Cardiac MRI 03/02/2023: There is hypokinesis of all LV mid ventricular segments which considering presentation with troponin elevated normal coronary arteries on cath suggest mid ventricular Takotsubo cardiomyopathy.  Asymmetric LV hypertrophy and basal septum meeting criteria for hypertrophic cardiomyopathy.  Normal LV size with moderate systolic dysfunction.  Normal RV size and function. Hypertension. Hyperlipidemia. Lipid panel 03/01/2023: LDL 43, HDL 30, TG 86, total 90. LPa 03/01/2023: 164.7. CKD stage IIIa.   History of Present Illness    Shane Sampson was first evaluated by Dr. Bjorn Pippin on 01/02/2020 for CAD at the request of Dr. Waynard Edwards.  Patient had undergone calcium scoring which showed a calcium score of 1065 (94th percentile).  Nuclear stress test was a low risk study.  Echo showed normal LV/RV function, Grade II DD, mild LAE, mild aortic valve sclerosis without stenosis.  Patient was last seen in the office on 04/14/2022 for routine follow-up.  He was doing well at that time and no medication changes were made.  Patient presented to the ED on 02/28/2023 with complaints of central upper nonradiating chest pain.  Upon EMS arrival patient  was hypertensive at 210/120 and tachycardic at 150 bpm.  Troponin positive x 2 294>> 1420.  LHC showed mild to moderate diffuse coronary disease but no culprit lesion.  Echo with mildly reduced LV function and atypical pattern of abnormal wall motion suspicious for a variant of Takotsubo syndrome.  Cardiac MRI confirmed findings of Takotsubo cardiomyopathy.  He was discharged on 03/04/2023.  Today, patient is here alone. He reports he is doing well since getting out of the hospital. He denies shortness of breath or dyspnea on exertion. No chest pain, pressure, or tightness. Denies lower extremity edema, orthopnea, or PND. No palpitations. He is walking a couple of times a day. He walked a total of 2.5 miles yesterday. 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  He is ready to get back to golfing. His BP is low today at 84/57 on intake and 100/60 on recheck. He reports very mild lightheadedness with position changes. He is concerned about his follow up as he believes he will need surgery for his a mass found on his kidney and was told by Dr. Allyson Sabal in the hospital he will need to a wait 2-3 months.     ROS: All other systems reviewed and are otherwise negative except as noted in History of Present Illness.  Studies Reviewed    ECG is not ordered today.          Physical Exam    VS:  BP 100/60 (BP Location: Left Arm, Patient Position: Sitting, Cuff Size: Normal)   Pulse 89   Ht 6' (1.829 m)   Wt 166 lb 12.8 oz (  75.7 kg)   SpO2 96%   BMI 22.62 kg/m  , BMI Body mass index is 22.62 kg/m.  GEN: Well nourished, well developed, in no acute distress. Neck: No JVD or carotid bruits. Cardiac:  RRR. No murmurs. No rubs or gallops.   Respiratory:  Respirations regular and unlabored. Clear to auscultation without rales, wheezing or rhonchi. GI: Soft, nontender, nondistended. Extremities: Radials/DP/PT 2+ and equal bilaterally. Well healed cath site without erythema or ecchymosis.   No clubbing or cyanosis. No edema.   Skin: Warm and dry, no rash. Neuro: Strength intact.  Assessment & Plan    CAD.  LHC May 2024 showed mild to moderate diffuse coronary disease.  Patient denies chest pain, pressure, tightness. He is walking a couple of times a day and is ready to get back to golfing. He can start back slowly by the end of next week. 2+ right radial pulse, well healed cath site without erythema or ecchymosis. Continue aspirin, atorvastatin, ezetimibe, carvedilol (see #3), as needed SL NTG. HFmrEF/Takotsubo cardiomyopathy.  Echo May 2024 showed EF 45 to 50%, atypical pattern of abnormal wall motion suspicious for variant of Takotsubo syndrome.  Cardiac MRI May 2024 6 showed hypokinesis of all LV mid ventricular segments suggestive of mid ventricular Takotsubo cardiomyopathy.  Patient denies shortness of breath, DOE, lower extremity edema, orthopnea or PND. Euvolemic and well compensated on exam.  Continue carvedilol, Jardiance, Entresto, spironolactone. Hypertension. BP today 84/57 on intake and 100/60 on recheck. Patient reports occasional mild lightheadedness with position changes. Patient denies headaches, dizziness or vision changes. Decrease carvedilol to 6.25 mg bid. Continue Entresto, spironolactone. Hyperlipidemia.  LDL May 2024 43, at goal.  Patient's Lipitor was increased and Zetia added during recent hospitalization.  Will repeat lipid panel and LFTs sometime in July/August.  Continue atorvastatin and Zetia.  Disposition: Decrease carvedilol to 6.25 mg bid. Repeat echo in 2.5-3 months prior to follow up with Dr. Bjorn Pippin. Return sooner as needed. Patient will need repeat lipid panel and LFTs at follow up.     Cardiac Rehabilitation Eligibility Assessment  The patient is ready to start cardiac rehabilitation from a cardiac standpoint.        Signed, Etta Grandchild. Aolani Piggott, DNP, NP-C

## 2023-03-11 NOTE — Patient Instructions (Signed)
Medication Instructions:   DECREASE CARVEDILOL TO 6.25mg  TWICE DAILY (NEW PRESCRIPTION SENT TO PHARMACY)  *If you need a refill on your cardiac medications before your next appointment, please call your pharmacy*  Lab Work: BLOOD WORK TODAY  If you have labs (blood work) drawn today and your tests are completely normal, you will receive your results only by: MyChart Message (if you have MyChart) OR A paper copy in the mail If you have any lab test that is abnormal or we need to change your treatment, we will call you to review the results.  Testing/Procedures: Your physician has requested that you have an echocardiogram IN 2.5/3 MONTHS. Echocardiography is a painless test that uses sound waves to create images of your heart. It provides your doctor with information about the size and shape of your heart and how well your heart's chambers and valves are working. You may receive an ultrasound enhancing agent through an IV if needed to better visualize your heart during the echo.This procedure takes approximately one hour. There are no restrictions for this procedure. This will take place at the [address], [address].   Follow-Up: At [facility], you and your health needs are our priority.  As part of our continuing mission to provide you with exceptional heart care, we have created designated Provider Care Teams.  These Care Teams include your primary Cardiologist (physician) and Advanced Practice Providers (APPs -  Physician Assistants and Nurse Practitioners) who all work together to provide you with the care you need, when you need it.  Your next appointment:   RIGHT AFTER ECHO   Provider:   Little Ishikawa, MD

## 2023-03-11 NOTE — Telephone Encounter (Signed)
Patient appt. Is today.  Did speak with patient to confirm his time and he will be at appt.

## 2023-03-12 LAB — BASIC METABOLIC PANEL
BUN/Creatinine Ratio: 22 (ref 10–24)
BUN: 39 mg/dL — ABNORMAL HIGH (ref 8–27)
CO2: 21 mmol/L (ref 20–29)
Calcium: 10.8 mg/dL — ABNORMAL HIGH (ref 8.6–10.2)
Chloride: 99 mmol/L (ref 96–106)
Creatinine, Ser: 1.74 mg/dL — ABNORMAL HIGH (ref 0.76–1.27)
Glucose: 145 mg/dL — ABNORMAL HIGH (ref 70–99)
Potassium: 5.7 mmol/L — ABNORMAL HIGH (ref 3.5–5.2)
Sodium: 138 mmol/L (ref 134–144)
eGFR: 42 mL/min/{1.73_m2} — ABNORMAL LOW (ref >59)

## 2023-03-14 ENCOUNTER — Telehealth: Payer: Self-pay | Admitting: Cardiology

## 2023-03-14 ENCOUNTER — Other Ambulatory Visit: Payer: Self-pay

## 2023-03-14 DIAGNOSIS — I2510 Atherosclerotic heart disease of native coronary artery without angina pectoris: Secondary | ICD-10-CM

## 2023-03-14 MED ORDER — LOKELMA 10 G PO PACK: 10.0000 g | PACK | Freq: Once | ORAL | 0 refills | Status: AC

## 2023-03-14 NOTE — Telephone Encounter (Signed)
Patient states he has been doing really well but yesterday his blood pressure was really low 100/60 and 80/60. He states his heart rate was between ranging in the 120's and ws high as 139. Today he is doing fine but he is concerned about his heart rate and blood pressure being that low. Advised to continue to monitor blood pressure and heart rate. ED precautions discussed. Will forward to provider for further recommendations. He will come to office to pick up lokelma today. He will stop spironolactone. He repeated recommendations back to me. He verbalized understanding.

## 2023-03-14 NOTE — Telephone Encounter (Signed)
Pt returning call regarding lab results. Please advise 

## 2023-03-15 ENCOUNTER — Other Ambulatory Visit: Payer: Self-pay

## 2023-03-15 ENCOUNTER — Inpatient Hospital Stay (HOSPITAL_COMMUNITY)
Admission: EM | Admit: 2023-03-15 | Discharge: 2023-03-18 | DRG: 981 | Disposition: A | Discharge status: Other Acute Inpatient Hospital | Payer: 59 | Attending: Pulmonary Disease | Admitting: Pulmonary Disease

## 2023-03-15 ENCOUNTER — Inpatient Hospital Stay (HOSPITAL_COMMUNITY): Payer: 59 | Admitting: Anesthesiology

## 2023-03-15 ENCOUNTER — Inpatient Hospital Stay (HOSPITAL_COMMUNITY): Payer: 59

## 2023-03-15 ENCOUNTER — Encounter (HOSPITAL_COMMUNITY): Payer: Self-pay | Admitting: General Surgery

## 2023-03-15 ENCOUNTER — Emergency Department (HOSPITAL_COMMUNITY): Payer: 59

## 2023-03-15 ENCOUNTER — Encounter (HOSPITAL_COMMUNITY)
Admission: EM | Disposition: A | Discharge status: Other Acute Inpatient Hospital | Payer: Self-pay | Source: Home / Self Care | Attending: Pulmonary Disease

## 2023-03-15 ENCOUNTER — Telehealth (HOSPITAL_COMMUNITY): Payer: Self-pay

## 2023-03-15 DIAGNOSIS — N179 Acute kidney failure, unspecified: Secondary | ICD-10-CM | POA: Diagnosis not present

## 2023-03-15 DIAGNOSIS — E872 Acidosis, unspecified: Secondary | ICD-10-CM | POA: Diagnosis present

## 2023-03-15 DIAGNOSIS — R7303 Prediabetes: Secondary | ICD-10-CM | POA: Diagnosis present

## 2023-03-15 DIAGNOSIS — D72829 Elevated white blood cell count, unspecified: Secondary | ICD-10-CM | POA: Diagnosis not present

## 2023-03-15 DIAGNOSIS — R578 Other shock: Secondary | ICD-10-CM | POA: Diagnosis present

## 2023-03-15 DIAGNOSIS — N400 Enlarged prostate without lower urinary tract symptoms: Secondary | ICD-10-CM | POA: Diagnosis present

## 2023-03-15 DIAGNOSIS — Z7982 Long term (current) use of aspirin: Secondary | ICD-10-CM

## 2023-03-15 DIAGNOSIS — M96A3 Multiple fractures of ribs associated with chest compression and cardiopulmonary resuscitation: Secondary | ICD-10-CM | POA: Diagnosis present

## 2023-03-15 DIAGNOSIS — R000 Tachycardia, unspecified: Secondary | ICD-10-CM | POA: Diagnosis present

## 2023-03-15 DIAGNOSIS — E278 Other specified disorders of adrenal gland: Secondary | ICD-10-CM

## 2023-03-15 DIAGNOSIS — I7779 Dissection of other artery: Secondary | ICD-10-CM | POA: Diagnosis present

## 2023-03-15 DIAGNOSIS — I5022 Chronic systolic (congestive) heart failure: Secondary | ICD-10-CM | POA: Diagnosis present

## 2023-03-15 DIAGNOSIS — I469 Cardiac arrest, cause unspecified: Secondary | ICD-10-CM

## 2023-03-15 DIAGNOSIS — N170 Acute kidney failure with tubular necrosis: Secondary | ICD-10-CM | POA: Diagnosis present

## 2023-03-15 DIAGNOSIS — I509 Heart failure, unspecified: Secondary | ICD-10-CM | POA: Diagnosis not present

## 2023-03-15 DIAGNOSIS — I252 Old myocardial infarction: Secondary | ICD-10-CM

## 2023-03-15 DIAGNOSIS — E279 Disorder of adrenal gland, unspecified: Secondary | ICD-10-CM | POA: Diagnosis present

## 2023-03-15 DIAGNOSIS — E875 Hyperkalemia: Secondary | ICD-10-CM | POA: Diagnosis present

## 2023-03-15 DIAGNOSIS — E2749 Other adrenocortical insufficiency: Secondary | ICD-10-CM | POA: Diagnosis not present

## 2023-03-15 DIAGNOSIS — I959 Hypotension, unspecified: Secondary | ICD-10-CM | POA: Diagnosis present

## 2023-03-15 DIAGNOSIS — Z7984 Long term (current) use of oral hypoglycemic drugs: Secondary | ICD-10-CM | POA: Diagnosis not present

## 2023-03-15 DIAGNOSIS — E871 Hypo-osmolality and hyponatremia: Secondary | ICD-10-CM | POA: Diagnosis present

## 2023-03-15 DIAGNOSIS — R0603 Acute respiratory distress: Secondary | ICD-10-CM | POA: Diagnosis not present

## 2023-03-15 DIAGNOSIS — K661 Hemoperitoneum: Secondary | ICD-10-CM

## 2023-03-15 DIAGNOSIS — R579 Shock, unspecified: Secondary | ICD-10-CM

## 2023-03-15 DIAGNOSIS — I130 Hypertensive heart and chronic kidney disease with heart failure and stage 1 through stage 4 chronic kidney disease, or unspecified chronic kidney disease: Secondary | ICD-10-CM | POA: Diagnosis present

## 2023-03-15 DIAGNOSIS — J9601 Acute respiratory failure with hypoxia: Secondary | ICD-10-CM | POA: Diagnosis present

## 2023-03-15 DIAGNOSIS — D497 Neoplasm of unspecified behavior of endocrine glands and other parts of nervous system: Secondary | ICD-10-CM | POA: Diagnosis present

## 2023-03-15 DIAGNOSIS — S2243XA Multiple fractures of ribs, bilateral, initial encounter for closed fracture: Secondary | ICD-10-CM

## 2023-03-15 DIAGNOSIS — N1831 Chronic kidney disease, stage 3a: Secondary | ICD-10-CM | POA: Diagnosis present

## 2023-03-15 DIAGNOSIS — E785 Hyperlipidemia, unspecified: Secondary | ICD-10-CM | POA: Diagnosis present

## 2023-03-15 DIAGNOSIS — I5181 Takotsubo syndrome: Secondary | ICD-10-CM | POA: Diagnosis not present

## 2023-03-15 DIAGNOSIS — I428 Other cardiomyopathies: Secondary | ICD-10-CM | POA: Diagnosis not present

## 2023-03-15 DIAGNOSIS — Z79899 Other long term (current) drug therapy: Secondary | ICD-10-CM

## 2023-03-15 DIAGNOSIS — I110 Hypertensive heart disease with heart failure: Secondary | ICD-10-CM

## 2023-03-15 HISTORY — PX: IR US GUIDE VASC ACCESS RIGHT: IMG2390

## 2023-03-15 HISTORY — PX: RADIOLOGY WITH ANESTHESIA: SHX6223

## 2023-03-15 HISTORY — PX: IR EMBO TUMOR ORGAN ISCHEMIA INFARCT INC GUIDE ROADMAPPING: IMG5449

## 2023-03-15 HISTORY — PX: IR ANGIOGRAM VISCERAL SELECTIVE: IMG657

## 2023-03-15 LAB — COOXEMETRY PANEL
Carboxyhemoglobin: 2.5 % — ABNORMAL HIGH (ref 0.5–1.5)
Methemoglobin: <0.7 % (ref 0.0–1.5)
O2 Saturation: 76.9 %
Total hemoglobin: 12.8 g/dL (ref 12.0–16.0)

## 2023-03-15 LAB — POCT I-STAT 7, (LYTES, BLD GAS, ICA,H+H)
Acid-Base Excess: 3 mmol/L — ABNORMAL HIGH (ref 0.0–2.0)
Bicarbonate: 28.7 mmol/L — ABNORMAL HIGH (ref 20.0–28.0)
Calcium, Ion: 1.01 mmol/L — ABNORMAL LOW (ref 1.15–1.40)
HCT: 36 % — ABNORMAL LOW (ref 39.0–52.0)
Hemoglobin: 12.2 g/dL — ABNORMAL LOW (ref 13.0–17.0)
O2 Saturation: 98 %
Potassium: 5.3 mmol/L — ABNORMAL HIGH (ref 3.5–5.1)
Sodium: 141 mmol/L (ref 135–145)
TCO2: 30 mmol/L (ref 22–32)
pCO2 arterial: 45.5 mmHg (ref 32–48)
pH, Arterial: 7.407 (ref 7.35–7.45)
pO2, Arterial: 98 mmHg (ref 83–108)

## 2023-03-15 LAB — COMPREHENSIVE METABOLIC PANEL
ALT: 24 U/L (ref 0–44)
ALT: 21 U/L (ref 0–44)
AST: 20 U/L (ref 15–41)
AST: 15 U/L (ref 15–41)
Albumin: 3.4 g/dL — ABNORMAL LOW (ref 3.5–5.0)
Albumin: 2.5 g/dL — ABNORMAL LOW (ref 3.5–5.0)
Alkaline Phosphatase: 82 U/L (ref 38–126)
Alkaline Phosphatase: 54 U/L (ref 38–126)
Anion gap: 13 (ref 5–15)
Anion gap: 10 (ref 5–15)
BUN: 57 mg/dL — ABNORMAL HIGH (ref 8–23)
BUN: 54 mg/dL — ABNORMAL HIGH (ref 8–23)
CO2: 19 mmol/L — ABNORMAL LOW (ref 22–32)
CO2: 22 mmol/L (ref 22–32)
Calcium: 8.8 mg/dL — ABNORMAL LOW (ref 8.9–10.3)
Calcium: 7.2 mg/dL — ABNORMAL LOW (ref 8.9–10.3)
Chloride: 102 mmol/L (ref 98–111)
Chloride: 107 mmol/L (ref 98–111)
Creatinine, Ser: 2.54 mg/dL — ABNORMAL HIGH (ref 0.61–1.24)
Creatinine, Ser: 2.43 mg/dL — ABNORMAL HIGH (ref 0.61–1.24)
GFR, Estimated: 27 mL/min — ABNORMAL LOW (ref >60)
GFR, Estimated: 28 mL/min — ABNORMAL LOW (ref >60)
Glucose, Bld: 419 mg/dL — ABNORMAL HIGH (ref 70–99)
Glucose, Bld: 166 mg/dL — ABNORMAL HIGH (ref 70–99)
Potassium: 4.5 mmol/L (ref 3.5–5.1)
Potassium: 4.6 mmol/L (ref 3.5–5.1)
Sodium: 134 mmol/L — ABNORMAL LOW (ref 135–145)
Sodium: 139 mmol/L (ref 135–145)
Total Bilirubin: 1.7 mg/dL — ABNORMAL HIGH (ref 0.3–1.2)
Total Bilirubin: 2 mg/dL — ABNORMAL HIGH (ref 0.3–1.2)
Total Protein: 6.2 g/dL — ABNORMAL LOW (ref 6.5–8.1)
Total Protein: 4.2 g/dL — ABNORMAL LOW (ref 6.5–8.1)

## 2023-03-15 LAB — URINALYSIS, ROUTINE W REFLEX MICROSCOPIC
Bacteria, UA: NONE SEEN
Bilirubin Urine: NEGATIVE
Glucose, UA: >=500 mg/dL — AB
Hgb urine dipstick: NEGATIVE
Ketones, ur: NEGATIVE mg/dL
Leukocytes,Ua: NEGATIVE
Nitrite: NEGATIVE
Protein, ur: 30 mg/dL — AB
Specific Gravity, Urine: 1.035 — ABNORMAL HIGH (ref 1.005–1.030)
pH: 5 (ref 5.0–8.0)

## 2023-03-15 LAB — I-STAT ARTERIAL BLOOD GAS, ED
Acid-base deficit: 10 mmol/L — ABNORMAL HIGH (ref 0.0–2.0)
Bicarbonate: 16.1 mmol/L — ABNORMAL LOW (ref 20.0–28.0)
Calcium, Ion: 1.08 mmol/L — ABNORMAL LOW (ref 1.15–1.40)
HCT: 33 % — ABNORMAL LOW (ref 39.0–52.0)
Hemoglobin: 11.2 g/dL — ABNORMAL LOW (ref 13.0–17.0)
O2 Saturation: 100 %
Patient temperature: 36.5
Potassium: 6.5 mmol/L (ref 3.5–5.1)
Sodium: 131 mmol/L — ABNORMAL LOW (ref 135–145)
TCO2: 17 mmol/L — ABNORMAL LOW (ref 22–32)
pCO2 arterial: 35.6 mmHg (ref 32–48)
pH, Arterial: 7.26 — ABNORMAL LOW (ref 7.35–7.45)
pO2, Arterial: 306 mmHg — ABNORMAL HIGH (ref 83–108)

## 2023-03-15 LAB — CBC
HCT: 38.7 % — ABNORMAL LOW (ref 39.0–52.0)
Hemoglobin: 13.6 g/dL (ref 13.0–17.0)
MCH: 29.9 pg (ref 26.0–34.0)
MCH: 31 pg (ref 26.0–34.0)
MCHC: 34.2 g/dL (ref 30.0–36.0)
MCHC: 35.1 g/dL (ref 30.0–36.0)
MCV: 87.3 fL (ref 80.0–100.0)
MCV: 88.2 fL (ref 80.0–100.0)
Platelets: 282 10*3/uL (ref 150–400)
RBC: 4.39 MIL/uL (ref 4.22–5.81)
RDW: 14.1 % (ref 11.5–15.5)
WBC: 57.3 10*3/uL (ref 4.0–10.5)
nRBC: 0 % (ref 0.0–0.2)

## 2023-03-15 LAB — CORTISOL
Cortisol, Plasma: 16.9 ug/dL
Cortisol, Plasma: >100 ug/dL

## 2023-03-15 LAB — CBC WITH DIFFERENTIAL/PLATELET
Abs Immature Granulocytes: 0.15 10*3/uL — ABNORMAL HIGH (ref 0.00–0.07)
Basophils Absolute: 0.2 10*3/uL — ABNORMAL HIGH (ref 0.0–0.1)
Basophils Relative: 1 %
Eosinophils Absolute: 0.3 10*3/uL (ref 0.0–0.5)
Eosinophils Relative: 1 %
HCT: 45.6 % (ref 39.0–52.0)
Hemoglobin: 14.6 g/dL (ref 13.0–17.0)
Immature Granulocytes: 1 %
Lymphocytes Relative: 14 %
Lymphs Abs: 2.6 10*3/uL (ref 0.7–4.0)
MCH: 29.9 pg (ref 26.0–34.0)
MCHC: 32 g/dL (ref 30.0–36.0)
MCV: 93.4 fL (ref 80.0–100.0)
Monocytes Absolute: 0.9 10*3/uL (ref 0.1–1.0)
Monocytes Relative: 5 %
Neutro Abs: 15.4 10*3/uL — ABNORMAL HIGH (ref 1.7–7.7)
Neutrophils Relative %: 78 %
Platelets: 494 10*3/uL — ABNORMAL HIGH (ref 150–400)
RBC: 4.88 MIL/uL (ref 4.22–5.81)
RDW: 13.4 % (ref 11.5–15.5)
WBC: 19.5 10*3/uL — ABNORMAL HIGH (ref 4.0–10.5)
nRBC: 0 % (ref 0.0–0.2)

## 2023-03-15 LAB — GLUCOSE, CAPILLARY
Glucose-Capillary: 149 mg/dL — ABNORMAL HIGH (ref 70–99)
Glucose-Capillary: 225 mg/dL — ABNORMAL HIGH (ref 70–99)

## 2023-03-15 LAB — HEMOGLOBIN AND HEMATOCRIT, BLOOD
HCT: 43.6 % (ref 39.0–52.0)
Hemoglobin: 14.2 g/dL (ref 13.0–17.0)

## 2023-03-15 LAB — PROTIME-INR
INR: 1.3 — ABNORMAL HIGH (ref 0.8–1.2)
Prothrombin Time: 15.9 seconds — ABNORMAL HIGH (ref 11.4–15.2)

## 2023-03-15 LAB — LACTIC ACID, PLASMA
Lactic Acid, Venous: 2.5 mmol/L (ref 0.5–1.9)
Lactic Acid, Venous: 2.2 mmol/L (ref 0.5–1.9)

## 2023-03-15 LAB — BPAM RBC
ISSUE DATE / TIME: 202406041630
Unit Type and Rh: 5100

## 2023-03-15 LAB — TYPE AND SCREEN
ABO/RH(D): A NEG
Unit division: 0
Unit division: 0

## 2023-03-15 LAB — PREPARE RBC (CROSSMATCH)

## 2023-03-15 LAB — CBG MONITORING, ED: Glucose-Capillary: 292 mg/dL — ABNORMAL HIGH (ref 70–99)

## 2023-03-15 LAB — LIPASE, BLOOD: Lipase: 48 U/L (ref 11–51)

## 2023-03-15 LAB — MRSA NEXT GEN BY PCR, NASAL: MRSA by PCR Next Gen: NOT DETECTED

## 2023-03-15 LAB — TROPONIN I (HIGH SENSITIVITY)
Troponin I (High Sensitivity): 84 ng/L — ABNORMAL HIGH (ref <18)
Troponin I (High Sensitivity): 96 ng/L — ABNORMAL HIGH (ref <18)
Troponin I (High Sensitivity): 126 ng/L (ref <18)

## 2023-03-15 LAB — ABO/RH: ABO/RH(D): A NEG

## 2023-03-15 SURGERY — IR WITH ANESTHESIA
Anesthesia: General

## 2023-03-15 MED ORDER — MORPHINE SULFATE (PF) 4 MG/ML IV SOLN
4.0000 mg | Freq: Once | INTRAVENOUS | Status: AC
  Administered 2023-03-15: 4 mg via INTRAVENOUS
  Filled 2023-03-15: qty 1

## 2023-03-15 MED ORDER — IOHEXOL 350 MG/ML SOLN
75.0000 mL | Freq: Once | INTRAVENOUS | Status: AC | PRN
  Administered 2023-03-15: 75 mL via INTRAVENOUS

## 2023-03-15 MED ORDER — LIDOCAINE HCL (PF) 1 % IJ SOLN
INTRAMUSCULAR | Status: AC
  Filled 2023-03-15: qty 30

## 2023-03-15 MED ORDER — PROPOFOL 1000 MG/100ML IV EMUL
5.0000 ug/kg/min | INTRAVENOUS | Status: DC
  Administered 2023-03-15: 15 ug/kg/min via INTRAVENOUS

## 2023-03-15 MED ORDER — MIDAZOLAM HCL 2 MG/2ML IJ SOLN: 1.0000 mg | INTRAMUSCULAR | Status: DC | PRN

## 2023-03-15 MED ORDER — SUCCINYLCHOLINE CHLORIDE 20 MG/ML IJ SOLN
INTRAMUSCULAR | Status: DC | PRN
  Administered 2023-03-15: 100 mg via INTRAVENOUS

## 2023-03-15 MED ORDER — ONDANSETRON HCL 4 MG/2ML IJ SOLN
4.0000 mg | Freq: Once | INTRAMUSCULAR | Status: AC
  Administered 2023-03-15: 4 mg via INTRAVENOUS
  Filled 2023-03-15: qty 2

## 2023-03-15 MED ORDER — NOREPINEPHRINE 4 MG/250ML-% IV SOLN
0.0000 ug/min | INTRAVENOUS | Status: DC
  Administered 2023-03-15: 20 ug/min via INTRAVENOUS

## 2023-03-15 MED ORDER — ORAL CARE MOUTH RINSE: 15.0000 mL | OROMUCOSAL | Status: DC | PRN

## 2023-03-15 MED ORDER — SODIUM CHLORIDE 0.9% IV SOLUTION: Freq: Once | INTRAVENOUS | Status: AC

## 2023-03-15 MED ORDER — DEXTROSE 50 % IV SOLN
INTRAVENOUS | Status: AC
  Filled 2023-03-15: qty 50

## 2023-03-15 MED ORDER — FENTANYL 2500MCG IN NS 250ML (10MCG/ML) PREMIX INFUSION
INTRAVENOUS | Status: AC
  Administered 2023-03-15: 25 ug/h via INTRAVENOUS
  Filled 2023-03-15: qty 250

## 2023-03-15 MED ORDER — SODIUM BICARBONATE 8.4 % IV SOLN
INTRAVENOUS | Status: AC
  Filled 2023-03-15: qty 100

## 2023-03-15 MED ORDER — LACTATED RINGERS IV SOLN: INTRAVENOUS | Status: DC | PRN

## 2023-03-15 MED ORDER — SODIUM BICARBONATE 8.4 % IV SOLN
INTRAVENOUS | Status: DC | PRN
  Administered 2023-03-15: 50 meq via INTRAVENOUS

## 2023-03-15 MED ORDER — FENTANYL CITRATE PF 50 MCG/ML IJ SOSY
25.0000 ug | PREFILLED_SYRINGE | INTRAMUSCULAR | Status: DC | PRN
  Administered 2023-03-16: 100 ug via INTRAVENOUS
  Administered 2023-03-16 (×2): 50 ug via INTRAVENOUS

## 2023-03-15 MED ORDER — ROCURONIUM BROMIDE 10 MG/ML (PF) SYRINGE
PREFILLED_SYRINGE | INTRAVENOUS | Status: DC | PRN
  Administered 2023-03-15: 60 mg via INTRAVENOUS
  Administered 2023-03-15: 40 mg via INTRAVENOUS

## 2023-03-15 MED ORDER — CALCIUM GLUCONATE 10 % IV SOLN
1.0000 g | Freq: Once | INTRAVENOUS | Status: AC
  Administered 2023-03-15: 1 g via INTRAVENOUS
  Filled 2023-03-15: qty 10

## 2023-03-15 MED ORDER — SODIUM CHLORIDE 0.9 % IV BOLUS
1000.0000 mL | Freq: Once | INTRAVENOUS | Status: AC
  Administered 2023-03-15: 1000 mL via INTRAVENOUS

## 2023-03-15 MED ORDER — SODIUM CHLORIDE 0.9 % IV SOLN: INTRAVENOUS | Status: DC

## 2023-03-15 MED ORDER — SODIUM BICARBONATE 8.4 % IV SOLN: INTRAVENOUS | Status: DC

## 2023-03-15 MED ORDER — HYDROCORTISONE SOD SUC (PF) 100 MG IJ SOLR
100.0000 mg | Freq: Three times a day (TID) | INTRAMUSCULAR | Status: DC
  Administered 2023-03-15 – 2023-03-16 (×2): 100 mg via INTRAVENOUS
  Filled 2023-03-15 (×2): qty 2

## 2023-03-15 MED ORDER — EPINEPHRINE 1 MG/10ML IJ SOSY
PREFILLED_SYRINGE | INTRAMUSCULAR | Status: AC | PRN
  Administered 2023-03-15: 1 mg via INTRAVENOUS

## 2023-03-15 MED ORDER — ETOMIDATE 2 MG/ML IV SOLN
INTRAVENOUS | Status: DC | PRN
  Administered 2023-03-15: 20 mg via INTRAVENOUS

## 2023-03-15 MED ORDER — CLEVIDIPINE BUTYRATE 0.5 MG/ML IV EMUL
INTRAVENOUS | Status: DC | PRN
  Administered 2023-03-15: 2 mg/h via INTRAVENOUS

## 2023-03-15 MED ORDER — ALBUMIN HUMAN 5 % IV SOLN: INTRAVENOUS | Status: DC | PRN

## 2023-03-15 MED ORDER — SODIUM CHLORIDE 0.9 % IV SOLN: INTRAVENOUS | Status: DC | PRN

## 2023-03-15 MED ORDER — SODIUM BICARBONATE 8.4 % IV SOLN
50.0000 meq | Freq: Once | INTRAVENOUS | Status: AC
  Administered 2023-03-15: 50 meq via INTRAVENOUS
  Filled 2023-03-15: qty 50

## 2023-03-15 MED ORDER — SODIUM BICARBONATE 8.4 % IV SOLN
INTRAVENOUS | Status: DC
  Filled 2023-03-15 (×2): qty 1000

## 2023-03-15 MED ORDER — ETOMIDATE 2 MG/ML IV SOLN: INTRAVENOUS | Status: DC | PRN

## 2023-03-15 MED ORDER — ORAL CARE MOUTH RINSE
15.0000 mL | OROMUCOSAL | Status: DC
  Administered 2023-03-15 – 2023-03-16 (×8): 15 mL via OROMUCOSAL

## 2023-03-15 MED ORDER — PROPOFOL 1000 MG/100ML IV EMUL
0.0000 ug/kg/min | INTRAVENOUS | Status: DC
  Administered 2023-03-15: 40 ug/kg/min via INTRAVENOUS
  Filled 2023-03-15: qty 100

## 2023-03-15 MED ORDER — VASOPRESSIN 20 UNIT/ML IV SOLN
INTRAVENOUS | Status: DC | PRN
  Administered 2023-03-15: 1 [IU] via INTRAVENOUS
  Administered 2023-03-15 (×2): .5 [IU] via INTRAVENOUS
  Administered 2023-03-15: 1 [IU] via INTRAVENOUS
  Administered 2023-03-15 (×3): .5 [IU] via INTRAVENOUS

## 2023-03-15 MED ORDER — FENTANYL 2500MCG IN NS 250ML (10MCG/ML) PREMIX INFUSION
0.0000 ug/h | INTRAVENOUS | Status: DC
  Administered 2023-03-15: 150 ug/h via INTRAVENOUS
  Administered 2023-03-16: 250 ug/h via INTRAVENOUS
  Filled 2023-03-15: qty 250

## 2023-03-15 MED ORDER — SODIUM CHLORIDE 0.9 % IV SOLN
0.5000 ug/min | INTRAVENOUS | Status: DC
  Filled 2023-03-15: qty 10

## 2023-03-15 MED ORDER — STERILE WATER FOR INJECTION IV SOLN
INTRAVENOUS | Status: DC
  Filled 2023-03-15: qty 1000

## 2023-03-15 MED ORDER — IODIXANOL 320 MG/ML IV SOLN
100.0000 mL | Freq: Once | INTRAVENOUS | Status: AC | PRN
  Administered 2023-03-15: 50 mL via INTRA_ARTERIAL

## 2023-03-15 MED ORDER — PANTOPRAZOLE SODIUM 40 MG IV SOLR
40.0000 mg | Freq: Every day | INTRAVENOUS | Status: DC
  Administered 2023-03-15: 40 mg via INTRAVENOUS
  Filled 2023-03-15: qty 10

## 2023-03-15 MED ORDER — VASOPRESSIN 20 UNITS/100 ML INFUSION FOR SHOCK
0.0000 [IU]/min | INTRAVENOUS | Status: DC
  Administered 2023-03-15: 0.03 [IU]/min via INTRAVENOUS
  Filled 2023-03-15 (×2): qty 100

## 2023-03-15 MED ORDER — INSULIN ASPART 100 UNIT/ML IJ SOLN
INTRAMUSCULAR | Status: AC
  Filled 2023-03-15: qty 1

## 2023-03-15 MED ORDER — DEXTROSE 50 % IV SOLN
INTRAVENOUS | Status: DC | PRN
  Administered 2023-03-15: 25 mL via INTRAVENOUS

## 2023-03-15 MED ORDER — INSULIN ASPART 100 UNIT/ML IJ SOLN
0.0000 [IU] | INTRAMUSCULAR | Status: DC
  Administered 2023-03-15 (×2): 10 [IU] via SUBCUTANEOUS
  Administered 2023-03-15: 5 [IU] via SUBCUTANEOUS
  Administered 2023-03-15: 2 [IU] via SUBCUTANEOUS
  Administered 2023-03-16: 3 [IU] via SUBCUTANEOUS
  Administered 2023-03-16 (×2): 2 [IU] via SUBCUTANEOUS
  Administered 2023-03-16: 3 [IU] via SUBCUTANEOUS
  Administered 2023-03-16 – 2023-03-17 (×2): 2 [IU] via SUBCUTANEOUS

## 2023-03-15 MED ORDER — CLEVIDIPINE BUTYRATE 0.5 MG/ML IV EMUL
INTRAVENOUS | Status: AC
  Filled 2023-03-15: qty 50

## 2023-03-15 MED ORDER — SODIUM BICARBONATE 8.4 % IV SOLN
100.0000 meq | Freq: Once | INTRAVENOUS | Status: AC
  Administered 2023-03-15: 100 meq via INTRAVENOUS
  Filled 2023-03-15: qty 50

## 2023-03-15 MED ORDER — PROPOFOL 10 MG/ML IV BOLUS
INTRAVENOUS | Status: DC | PRN
  Administered 2023-03-15 (×2): 100 mg via INTRAVENOUS
  Administered 2023-03-15: 50 mg via INTRAVENOUS
  Administered 2023-03-15: 100 mg via INTRAVENOUS
  Administered 2023-03-15: 50 mg via INTRAVENOUS

## 2023-03-15 MED ORDER — IODIXANOL 320 MG/ML IV SOLN: 100.0000 mL | Freq: Once | INTRAVENOUS | Status: DC | PRN

## 2023-03-15 NOTE — Progress Notes (Signed)
Pt arrived from IR being bagged by anesthesia. No issues reported. Pt placed on previous vent settings. FiO2 weaned per ABG. RT will continue to monitor and be available as needed.

## 2023-03-15 NOTE — Sedation Documentation (Signed)
Attempted to doppler pulses x 2 RN (GREG) Not successful. RLE Pulse doppler able. MD aware. Attempting to warm lower extremities.

## 2023-03-15 NOTE — Procedures (Signed)
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 No active extravasation or pseudoaneurysm identified.  Particle embolization performed with 3/4 vial of 300-500 micron sized Embosphere particles. Good occlusion of smaller branch vessels and significant slowing of flow in larger branches.  Unable to perform coil embolization due to inability to obtain good microcatheter purchase due to combination of vessel tortuosity and probable dissection of artery during procedure.  Right femoral arteriotomy closed with AngioSeal device.  Jodi Marble. Fredia Sorrow, M.D Pager:  [phone]

## 2023-03-15 NOTE — Telephone Encounter (Signed)
Called and spoke with pt in regards to CR, pt stated he has other health issues going on right now and is not ready to schedule.   Closed referral

## 2023-03-15 NOTE — Progress Notes (Signed)
Pt transported to CT and back to room 30 without any complications.

## 2023-03-15 NOTE — Progress Notes (Signed)
RT NOTE: RT transported patient on ventilator from ED to IR with no complications. CRNA placed patient on their ventilator. RT on unit patient pending to notified.

## 2023-03-15 NOTE — Progress Notes (Addendum)
NAME:  Shane Sampson, MRN:  [id number], DOB:  Jun 26, 1956, LOS: 0 ADMISSION DATE:  03/15/2023, CONSULTATION DATE:  03/15/2023 REFERRING MD:  Dr. Particia Nearing, ER, CHIEF COMPLAINT:  Abdominal pain   BRIEF  67 yo male with recent dx of Takotsubo CM and Lt adrenal mass presented with sudden onset of severe Lt abdominal pain.  Intubated by ER Had PEA cardiac arrest in ER with ROSC after about 5 minutes.  ? Post intubation CT abd/pelvis showed hemorrhage from adrenal mass.  On 03/01/23: eCHO Suspect a mid cavity  variant of takotsubo syndrome. Cannot exclude myocarditis. Left  ventricular ejection fraction, by estimation,  is 45 to 50   Pertinent  Medical History  HTN, HLD, Pre DM, BPH, CKD 3a, Takotsubo CM, Lt adrenal mass  Significant Hospital Events: Including procedures, antibiotic start and stop dates in addition to other pertinent events   6/04 Admit, surgery and IR consulted  Interim History / Subjective:    03/15/23 night shift note: Is status post left adrenal arteriography and embolization.  During procedure no active extravasation or pseudoaneurysm identified.  Particular with exertion was done.  IR was unable to perform coil embolization due to vessel tortuosity and probable dissection of artery during procedure  Postprocedure on the ventilator in the ICU at 40% FiO2.  Is extremely labile blood pressures.  Blood pressure is poorly responsive to Levophed and fluid bolus and vasopressin.Marland Kitchen  He is hyportensive and tachycardic.  Seems to respond to bicarb boluses with hypertension  Currently on fent gtt and diprivan gtt  Objective   Blood pressure 102/73, pulse (!) 122, temperature (!) 93.9 F (34.4 C), resp. rate 18, height 6' (1.829 m), weight 74.4 kg, SpO2 100 %.    Vent Mode: PRVC FiO2 (%):  [40 %-100 %] 40 % Set Rate:  [20 bmp] 20 bmp Vt Set:  [580 mL] 580 mL PEEP:  [5 cmH20] 5 cmH20 Plateau Pressure:  [14 cmH20-17 cmH20] 17 cmH20   Intake/Output Summary (Last 24 hours) at 03/15/2023  1959 Last data filed at 03/15/2023 1919 Gross per 24 hour  Intake 3013.44 ml  Output --  Net 3013.44 ml   Filed Weights   03/15/23 1243  Weight: 74.4 kg    Examination: General Appearance:  Looks criticall il. oN VENT .  Head:  Normocephalic, without obvious abnormality, atraumatic Eyes:  PERRL - yes, conjunctiva/corneas - muddy     Ears:  Normal external ear canals, both ears Nose:  G tube - no Throat:  ETT TUBE - yes , OG tube - yes Neck:  Supple,  No enlargement/tenderness/nodules Lungs: Clear to auscultation bilaterally, Ventilator   Synchrony - yes, 40% fio2 Heart:  S1 and S2 normal, no murmur, CVP - no.  Pressors -  levophed and vasop Abdomen:  Soft,  LLQ  appears full Genitalia / Rectal:  Not done Extremities:  Extremities- intact Skin:  ntact in exposed areas . Sacral area - not examined Neurologic:  Sedation - dioprivan off -> RASS - -3 . Moves all 4s - no. CAM-ICU - cannot trest . 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  Presented with with hemorrhage (Ispontaneous) 03/15/2023  - normal blood adrenal studies 03/03/23 -Status post emergent left adrenal artery embolization with possible dissection during the course.  Plan  - check 24h urine metanephrine (? Sent prior  admit;  blood studies normal) - surgery consulted  Acute hypoxic respiratory failure in setting of cardiac arrest. - Present on Admit  03/15/23: Does not meet  criteria for extubation  Plan - full vent support  Hemorrhagic shock with lactic acidosis. S/p 4 U PRBC in ER  03/15/23 PM: no evidence of obvious hemorrhage  plan - f/u CBC -- PRBC for hgb </= 6.9gm%    - exceptions are   -  if ACS susepcted/confirmed then transfuse for hgb </= 8.0gm%,  or    -  active bleeding with hemodynamic instability, then transfuse regardless of hemoglobin value   At at all times try  to transfuse 1 unit prbc as possible with exception of active hemorrhage   Takotsubo CM. PEA cardiac arrest. LAbile Hypotension and Hypertension - post IR procedure 6/4/24PM  03/15/23 PM : having labile BP that respnsd to bicarb. On and off neo gtt and vaso gtt  Plan Stat hydrocort given  Random cortisol (lab to run prior sample) MAP gial > 65 Bic gtt Stat echo Check co-ox Check troponin  Stop vasopressin and monitor Wean levophed to off  If hypertensive/LAbile, might need an ALPHA 1 blocker - [facility]  Await cardiology (paged fellow [place])   SEdation needs on ventilator  03/15/23 he is post arrest and on diprivan gtt and fent gtt. Did wiggle toes 1h after fentanyl turned off  Plan  - hold sedation and assess mental status  - consider normothermia protocoll based on mental status assessment -> not awake after 30 min -> start normothermia (thought at 21:27 did wiggle toes to command jus tprior to start of normothermia protocol)   AKI (5/20 - 5/22 ) and now present on admi t 03/15/2023 due to hemorrhage Hyperkalemia. ZOX0R. Baseline creat 1.15  Plan - f/u BMET - monitor urine outpt - foleyu + - bic gtt - track lytes  Pre-DM with hyperglycemia. - SSI    Best Practice (right click and "Reselect all SmartList Selections" daily)   Diet/type: NPO -  DVT prophylaxis: SCD GI prophylaxis: PPI Lines: Central line left subclavian Foley:  Yes, and it is still needed Code Status:  full code Last date of multidisciplinary goals of care discussion  03/15/23:  [updated wife at bedside]] 03/15/23: wife updated bedside 8:50 PM 03/15/2023   Interdisciplinary Goals of Care Family Meeting   Date carried out:: 03/15/2023  Location of the meeting: Bedside  Member's involved: Physician, Bedside Registered Nurse, Family Member or next of kin, and Other: sistser on the phone, wife at bedside and sister in law  Durable Power of Attorney or Environmental health practitioner: wife     Discussion: We discussed goals of care for [employer] .  Updated on all details  Code status: Full Code  Disposition: Continue current acute care + REQUEST TRANSFER to [facility]    Time spent for the meeting: 15  Selita Staiger 03/15/2023, 9:05 PM      ATTESTATION & SIGNATURE   The patient KEELIN ALBRACHT is critically ill with multiple organ systems failure and requires high complexity decision making for assessment and support, frequent evaluation and titration of therapies, application of advanced monitoring technologies and extensive interpretation of multiple databases and discussion with other appropriate health care personnel such as bedside nurses, social workers, case Production designer, theatre/television/film, consultants, respiratory therapists, nutritionists, secretaries etc.,  Critical care time includes but is not restricted to just documentation time. Documentation can happen in parallel or sequential to care time depending on case mix  urgency and priorities for the shift. So, overall critical Care Time devoted to patient care services described in this note is  75  Minutes.   This time reflects time of care of this signee Dr Kalman Shan which includ does not reflect procedure time, or teaching time or supervisory time of PA/NP/Med student/Med Resident etc but could involve care discussion time     Dr. Kalman Shan, M.D., [facility].C.P Pulmonary and Critical Care Medicine Staff Physician, [facility] Pager: [phone], If no answer or between  15:00h - 7:00h: call [phone]  03/15/2023 8:37 PM    LABS    PULMONARY Recent Labs  Lab 03/15/23 1552 03/15/23 1942 03/15/23 2010  PHART 7.260* 7.407  --   PCO2ART 35.6 45.5  --   PO2ART 306* 98  --   HCO3 16.1* 28.7*  --   TCO2 17* 30  --   O2SAT 100 98 76.9    CBC Recent Labs  Lab 03/15/23 1251 03/15/23 1552 03/15/23 1646 03/15/23 1942  HGB 14.6 11.2* 14.2 12.2*  HCT 45.6 33.0* 43.6  36.0*  WBC 19.5*  --   --   --   PLT 494*  --   --   --     COAGULATION No results for input(s): "INR" in the last 168 hours.  CARDIAC  No results for input(s): "TROPONINI" in the last 168 hours. No results for input(s): "PROBNP" in the last 168 hours.   CHEMISTRY Recent Labs  Lab 03/11/23 1518 03/15/23 1251 03/15/23 1552 03/15/23 1942  NA 138 134* 131* 141  K 5.7* 4.5 6.5* 5.3*  CL 99 102  --   --   CO2 21 19*  --   --   GLUCOSE 145* 419*  --   --   BUN 39* 57*  --   --   CREATININE 1.74* 2.54*  --   --   CALCIUM 10.8* 8.8*  --   --    Estimated Creatinine Clearance: 29.3 mL/min (A) (by C-G formula based on SCr of 2.54 mg/dL (H)).   LIVER Recent Labs  Lab 03/15/23 1251  AST 20  ALT 24  ALKPHOS 82  BILITOT 1.7*  PROT 6.2*  ALBUMIN 3.4*     INFECTIOUS Recent Labs  Lab 03/15/23 1646  LATICACIDVEN 2.5*     ENDOCRINE CBG (last 3)  Recent Labs    03/15/23 1407 03/15/23 1936  GLUCAP 292* 225*         IMAGING x48h  - image(s) personally visualized  -   highlighted in bold IR EMBO TUMOR ORGAN ISCHEMIA INFARCT INC GUIDE ROADMAPPING  Result Date: 03/15/2023 INDICATION: Acute massive hemorrhage from a known large left adrenal mass with cardiopulmonary arrest. The patient presents for emergent arteriography and embolization. EXAM: 1. ULTRASOUND GUIDANCE FOR VASCULAR ACCESS OF THE RIGHT COMMON FEMORAL ARTERY 2. SELECTIVE ARTERIOGRAPHY OF THE LEFT ADRENAL ARTERY (FIRST ORDER OFF OF ABDOMINAL AORTA) 3. PARTICLE EMBOLIZATION OF THE LEFT ADRENAL ARTERY FOR TUMOR ISCHEMIA MEDICATIONS: No additional medications. ANESTHESIA/SEDATION: The patient was intubated during the procedure and monitored by the Department of Anesthesia. CONTRAST:  50 mL Visipaque 320 FLUOROSCOPY TIME:  Radiation Exposure Index (as provided by the fluoroscopic device): 458 mGy Kerma COMPLICATIONS: None immediate. PROCEDURE: Informed consent was obtained from the patient's wife following  explanation of the procedure, risks, benefits and alternatives. The patient's wife understands, agrees and consents for the procedure. All questions were addressed. A time out  was performed prior to the initiation of the procedure. Maximal barrier sterile technique utilized including caps, mask, sterile gowns, sterile gloves, large sterile drape, hand hygiene, and chlorhexidine prep. Ultrasound was performed to confirm patency of the right common femoral artery. An ultrasound image was saved and recorded. Under ultrasound guidance, access of the artery was accomplished with a micropuncture set. A 5 French vascular sheath was then advanced into the artery over a guidewire. A 5 French Cobra catheter was advanced into the abdominal aorta. This was then used to selectively catheterize the origin of the left adrenal artery. Selective arteriography was performed. A microcatheter was then advanced through the 5 French catheter into the proximal aspect of the left adrenal artery. Additional selective arteriography was performed. The catheter was then further advanced into the left adrenal artery and further arteriography performed. Particle embolization was performed in the left renal artery with 300-500 micron sized Embosphere particles. Particles were slowly infused by hand injection in diluted contrast. Ultimately, 3/4 vial of particles were instilled. Additional arteriography was performed through the microcatheter. Different micro catheters were utilized in attempting further catheter advancement over guidewires to allow placement of embolization coils into the left adrenal artery. Ultimately microcatheter and 5 French catheter were removed. Hemostasis was obtained at the femoral access site utilizing the Angio-Seal device. FINDINGS: Selective left adrenal arteriography demonstrates abnormal vascularity supplying the known mass of the left adrenal gland with a rounded region of hypervascular enhancement noted. With  arteriography, there was no active extravasation of contrast noted or discrete pseudoaneurysms. The left adrenal artery was quite tortuous after takeoff and it was difficult advancing a microcatheter much beyond its initial superiorly oriented segment. Particle embolization was successful in occluding smaller branches supplying tumor and slowing flow significantly in the main branches of the left adrenal artery. Microcatheter purchase could not be accomplished beyond the proximal segment to allow safe placement of embolization coils with inability to advance a catheter over a guidewire. Catheter arteriography demonstrates dissection of the proximal vessel likely from catheter and guidewire manipulation which was the likely cause of inability to advance the microcatheter. For this reason, proximal coil embolization of the left renal artery was not performed. This does allow for potential future repeat embolization of the left adrenal artery once the dissection heals, should the patient require future embolization such as preoperative embolization. IMPRESSION: Technically successful microsphere particle embolization of left adrenal arterial supply to a bleeding left adrenal mass. Particle embolization was performed with 3/4 of a vial of 300-500 micron sized Embosphere particles. Proximal coil embolization could not be performed due to inability to advance the microcatheter sufficiently. Arteriography did demonstrate proximal dissection of the left adrenal artery during the procedure which was likely the cause of inability to advance the catheter. Lack of use of embolization coils does allow for potential future embolization of the left adrenal artery once the dissection heals. Electronically Signed   By: Irish Lack M.D.   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 The  patient presents for emergent arteriography and embolization. EXAM: 1. ULTRASOUND GUIDANCE FOR VASCULAR ACCESS OF THE RIGHT COMMON FEMORAL ARTERY 2. SELECTIVE ARTERIOGRAPHY OF THE LEFT ADRENAL ARTERY (FIRST ORDER OFF OF ABDOMINAL AORTA) 3. PARTICLE EMBOLIZATION OF THE LEFT ADRENAL ARTERY FOR TUMOR ISCHEMIA MEDICATIONS: No additional medications. ANESTHESIA/SEDATION: The patient was intubated during the procedure and monitored by the Department of Anesthesia. CONTRAST:  50 mL Visipaque 320 FLUOROSCOPY TIME:  Radiation Exposure Index (as provided by the fluoroscopic device): 458 mGy Kerma COMPLICATIONS: None immediate. PROCEDURE: Informed consent was obtained from the patient's wife following explanation of the procedure, risks, benefits and alternatives. The patient's wife understands, agrees and consents for the procedure. All questions were addressed. A time out was performed prior to the initiation of the procedure. Maximal barrier sterile technique utilized including caps, mask, sterile gowns, sterile gloves, large sterile drape, hand hygiene, and chlorhexidine prep. Ultrasound was performed to confirm patency of the right common femoral artery. An ultrasound image was saved and recorded. Under ultrasound guidance, access of the artery was accomplished with a micropuncture set. A 5 French vascular sheath was then advanced into the artery over a guidewire. A 5 French Cobra catheter was advanced into the abdominal aorta. This was then used to selectively catheterize the origin of the left adrenal artery. Selective arteriography was performed. A microcatheter was then advanced through the 5 French catheter into the proximal aspect of the left adrenal artery. Additional selective arteriography was performed. The catheter was then further advanced into the left adrenal artery and further arteriography performed. Particle embolization was performed in the left renal artery with 300-500 micron sized Embosphere  particles. Particles were slowly infused by hand injection in diluted contrast. Ultimately, 3/4 vial of particles were instilled. Additional arteriography was performed through the microcatheter. Different micro catheters were utilized in attempting further catheter advancement over guidewires to allow placement of embolization coils into the left adrenal artery. Ultimately microcatheter and 5 French catheter were removed. Hemostasis was obtained at the femoral access site utilizing the Angio-Seal device. FINDINGS: Selective left adrenal arteriography demonstrates abnormal vascularity supplying the known mass of the left adrenal gland with a rounded region of hypervascular enhancement noted. With arteriography, there was no active extravasation of contrast noted or discrete pseudoaneurysms. The left adrenal artery was quite tortuous after takeoff and it was difficult advancing a microcatheter much beyond its initial superiorly oriented segment. Particle embolization was successful in occluding smaller branches supplying tumor and slowing flow significantly in the main branches of the left adrenal artery. Microcatheter purchase could not be accomplished beyond the proximal segment to allow safe placement of embolization coils with inability to advance a catheter over a guidewire. Catheter arteriography demonstrates dissection of the proximal vessel likely from catheter and guidewire manipulation which was the likely cause of inability to advance the microcatheter. For this reason, proximal coil embolization of the left renal artery was not performed. This does allow for potential future repeat embolization of the left adrenal artery once the dissection heals, should the patient require future embolization such as preoperative embolization. IMPRESSION: Technically successful microsphere particle embolization of left adrenal arterial supply to a bleeding left adrenal mass. Particle embolization was performed with 3/4  of a vial of 300-500 micron sized Embosphere particles. Proximal coil embolization could not be performed due to inability to advance the microcatheter sufficiently. Arteriography did demonstrate proximal dissection of the left adrenal artery during the procedure which was likely the cause of inability to advance the catheter. Lack of use of embolization coils does allow for potential future embolization of the left adrenal artery once the dissection heals. Electronically Signed   By: Irish Lack M.D.   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 The patient presents for emergent arteriography and embolization.  EXAM: 1. ULTRASOUND GUIDANCE FOR VASCULAR ACCESS OF THE RIGHT COMMON FEMORAL ARTERY 2. SELECTIVE ARTERIOGRAPHY OF THE LEFT ADRENAL ARTERY (FIRST ORDER OFF OF ABDOMINAL AORTA) 3. PARTICLE EMBOLIZATION OF THE LEFT ADRENAL ARTERY FOR TUMOR ISCHEMIA MEDICATIONS: No additional medications. ANESTHESIA/SEDATION: The patient was intubated during the procedure and monitored by the Department of Anesthesia. CONTRAST:  50 mL Visipaque 320 FLUOROSCOPY TIME:  Radiation Exposure Index (as provided by the fluoroscopic device): 458 mGy Kerma COMPLICATIONS: None immediate. PROCEDURE: Informed consent was obtained from the patient's wife following explanation of the procedure, risks, benefits and alternatives. The patient's wife understands, agrees and consents for the procedure. All questions were addressed. A time out was performed prior to the initiation of the procedure. Maximal barrier sterile technique utilized including caps, mask, sterile gowns, sterile gloves, large sterile drape, hand hygiene, and chlorhexidine prep. Ultrasound was performed to confirm patency of the right common femoral artery. An ultrasound image was saved and recorded. Under ultrasound guidance, access of  the artery was accomplished with a micropuncture set. A 5 French vascular sheath was then advanced into the artery over a guidewire. A 5 French Cobra catheter was advanced into the abdominal aorta. This was then used to selectively catheterize the origin of the left adrenal artery. Selective arteriography was performed. A microcatheter was then advanced through the 5 French catheter into the proximal aspect of the left adrenal artery. Additional selective arteriography was performed. The catheter was then further advanced into the left adrenal artery and further arteriography performed. Particle embolization was performed in the left renal artery with 300-500 micron sized Embosphere particles. Particles were slowly infused by hand injection in diluted contrast. Ultimately, 3/4 vial of particles were instilled. Additional arteriography was performed through the microcatheter. Different micro catheters were utilized in attempting further catheter advancement over guidewires to allow placement of embolization coils into the left adrenal artery. Ultimately microcatheter and 5 French catheter were removed. Hemostasis was obtained at the femoral access site utilizing the Angio-Seal device. FINDINGS: Selective left adrenal arteriography demonstrates abnormal vascularity supplying the known mass of the left adrenal gland with a rounded region of hypervascular enhancement noted. With arteriography, there was no active extravasation of contrast noted or discrete pseudoaneurysms. The left adrenal artery was quite tortuous after takeoff and it was difficult advancing a microcatheter much beyond its initial superiorly oriented segment. Particle embolization was successful in occluding smaller branches supplying tumor and slowing flow significantly in the main branches of the left adrenal artery. Microcatheter purchase could not be accomplished beyond the proximal segment to allow safe placement of embolization coils with  inability to advance a catheter over a guidewire. Catheter arteriography demonstrates dissection of the proximal vessel likely from catheter and guidewire manipulation which was the likely cause of inability to advance the microcatheter. For this reason, proximal coil embolization of the left renal artery was not performed. This does allow for potential future repeat embolization of the left adrenal artery once the dissection heals, should the patient require future embolization such as preoperative embolization. IMPRESSION: Technically successful microsphere particle embolization of left adrenal arterial supply to a bleeding left adrenal mass. Particle embolization was performed with 3/4 of a vial of 300-500 micron sized Embosphere particles. Proximal coil embolization could not be performed due to inability to advance the microcatheter sufficiently. Arteriography did demonstrate proximal dissection of the left adrenal artery during the procedure which was likely the cause of inability to advance the catheter. Lack of use of embolization coils does allow for potential  future embolization of the left adrenal artery once the dissection heals. Electronically Signed   By: Irish Lack M.D.   On: 03/15/2023 19:10   DG Chest Portable 1 View  Result Date: 03/15/2023 CLINICAL DATA:  Central line placement EXAM: PORTABLE CHEST 1 VIEW COMPARISON:  Radiographs 03/15/2023 at 2:20 p.m. FINDINGS: Left subclavian central line tip in the mid SVC. Additional catheter tubing projects over the left axilla. This may represent a PICC line with tip near the left subclavian vein. Remainder unchanged from earlier today. Endotracheal tube tip in the intrathoracic trachea proximally 3.1 cm from the carina. Defibrillator pads. Stable cardiomediastinal silhouette. Aortic atherosclerotic calcification. Low lung volumes accentuate pulmonary vascularity. Bibasilar atelectasis/scarring. No definite pleural effusion. No pneumothorax. Known  rib fractures are better assessed on CT. IMPRESSION: Left subclavian central line tip in the mid SVC. Additional catheter tubing projects over the left axilla. This may represent a PICC line with tip near the left subclavian vein. Remainder unchanged from earlier today. Electronically Signed   By: Minerva Fester M.D.   On: 03/15/2023 17:46   DG Abd Portable 1 View  Result Date: 03/15/2023 CLINICAL DATA:  NG tube placement EXAM: PORTABLE ABDOMEN - 1 VIEW COMPARISON:  Chest radiographs 03/15/2023 FINDINGS: NG tube tip is presumably located within the lower esophagus is only partly visualized on this exam. Recommend advancement 7-8 cm. Remainder unchanged from CT earlier today. IMPRESSION: NG tube tip is presumably located within the lower esophagus however is only partially visualized. Recommend advancement prior to use. These results were called by telephone at the time of interpretation on 03/15/2023 at 3:45 pm to provider [facility] , who verbally acknowledged these results. Electronically Signed   By: Minerva Fester M.D.   On: 03/15/2023 15:52   CT Angio Chest/Abd/Pel for Dissection W and/or Wo Contrast  Result Date: 03/15/2023 CLINICAL DATA:  Upper left abdominal pain nausea and vomiting. Acute aortic syndrome suspected EXAM: CT ANGIOGRAPHY CHEST, ABDOMEN AND PELVIS TECHNIQUE: Non-contrast CT of the chest was initially obtained. Multidetector CT imaging through the chest, abdomen and pelvis was performed using the standard protocol during bolus administration of intravenous contrast. Multiplanar reconstructed images and MIPs were obtained and reviewed to evaluate the vascular anatomy. RADIATION DOSE REDUCTION: This exam was performed according to the departmental dose-optimization program which includes automated exposure control, adjustment of the mA and/or kV according to patient size and/or use of iterative reconstruction technique. CONTRAST:  75mL OMNIPAQUE IOHEXOL 350 MG/ML SOLN COMPARISON:  Chest  radiograph 03/15/2023; MRI abdomen 03/03/2023; CTA chest 03/01/2023 FINDINGS: CTA CHEST FINDINGS Cardiovascular: Preferential opacification of the thoracic aorta. No evidence of thoracic aortic aneurysm or dissection. Normal heart size. No pericardial effusion. Mediastinum/Nodes: Endotracheal tube tip in the intrathoracic trachea. Unremarkable esophagus. No lymphadenopathy. Lungs/Pleura: No pneumothorax. Trace left pleural effusion. Bibasilar atelectasis/scarring. Trachea and central airways are patent. Musculoskeletal: Minimal and nondisplaced fractures of the left anterior 2nd-7th ribs and right anterior 2nd-6th ribs. Review of the MIP images confirms the above findings. CTA ABDOMEN AND PELVIS FINDINGS VASCULAR Aorta: Normal in caliber without dissection, penetrating atherosclerotic ulcer, intramural hematoma. Moderate atherosclerotic plaque without hemodynamically significant stenosis. Celiac: Normal variant with the common hepatic artery arising directly from the aorta. The splenic artery is moderately secondary to mass effect from the hematoma (circa series 7/image 146). SMA: Patent. Renals: Patent without aneurysm or dissection. Calcified plaque causes mild narrowing at the origins of both renal arteries. IMA: Patent. Inflow: Scattered atherosclerotic plaque causes up to mild narrowing. No aneurysm or dissection.  Veins: No obvious venous abnormality within the limitations of this arterial phase study. Review of the MIP images confirms the above findings. NON-VASCULAR Hepatobiliary: Cholelithiasis. No evidence of cholecystitis. Unremarkable liver. Pancreas: Unremarkable. Spleen: Unremarkable. Adrenals/Urinary Tract: Heterogenous fluid compatible with blood products within the upper left retroperitoneal space extending inferiorly within the pararenal space appears to originate from the left adrenal mass where there is active hemorrhage. The hemorrhage measures approximately 15.0 x 12.6 x 15.9 cm (AP x ML x CC).  The borders of the left adrenal mass or obscured by the adjacent hemorrhage however this measures approximately 6.8 x 7.0 cm in the axial plane (7/164). This measured 6.0 by 5.3 cm on MRI 03/03/2023. Unremarkable right adrenal gland. Hemorrhage surrounds the left kidney without mass effect. Bilateral renal cysts which were previously evaluated with MRI 03/03/2023. No follow-up is recommended. No urinary calculi or hydronephrosis. Unremarkable bladder. Stomach/Bowel: Normal caliber large and small bowel. Colonic diverticulosis without diverticulitis. Normal appendix. Stomach is within normal limits. Lymphatic: No lymphadenopathy. Reproductive: Unremarkable. Other: No free intraperitoneal air. Musculoskeletal: Acute fracture or destructive osseous lesion sclerotic lesions in the bilateral iliac wings are favored to represent bone islands. Review of the MIP images confirms the above findings. IMPRESSION: 1. 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 2. Nondisplaced fractures of the left anterior 2nd-7th ribs and right anterior 2nd-6th ribs. No pneumothorax. Trace left pleural effusion. 3. Increased size of the left adrenal mass, previously evaluated with MRI 03/03/2023 Aortic Atherosclerosis (ICD10-I70.0). Critical Value/emergent results were called by telephone at the time of interpretation on 03/15/2023 at 3:16 pm to provider JULIE HAVILAND , who verbally acknowledged these results. Electronically Signed   By: Minerva Fester M.D.   On: 03/15/2023 15:24   DG Chest Portable 1 View  Result Date: 03/15/2023 CLINICAL DATA:  intubation confrimation EXAM: PORTABLE CHEST 1 VIEW COMPARISON:  CXR 02/28/23 FINDINGS: Endotracheal tube terminates at the level of the thoracic inlet approximately 4 cm above the carina. Low lung volumes. No pleural effusion. No pneumothorax no focal airspace opacity. Likely unchanged cardiac and mediastinal contours when accounting for differences in lung volumes. Visualized upper  abdomen is unremarkable. IMPRESSION: Endotracheal tube terminates at the level of the thoracic inlet. Low lung volumes. Electronically Signed   By: Lorenza Cambridge M.D.   On: 03/15/2023 14:37

## 2023-03-15 NOTE — Anesthesia Preprocedure Evaluation (Addendum)
Anesthesia Evaluation    Reviewed: Allergy & Precautions, Patient's Chart, lab work & pertinent test results, Unable to perform ROS - Chart review onlyPreop documentation limited or incomplete due to emergent nature of procedure.  History of Anesthesia Complications Negative for: history of anesthetic complications  Airway Mallampati: Intubated       Dental   Pulmonary           Cardiovascular hypertension, + Past MI (2 weeks ago) and +CHF     '24 TTE - Atypical pattern of abnormal wall motion involving the mid segments of  the heart with preserved apical contractility. 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 Cannot exclude myocarditis. EF is 45 to 50%. The left ventricle has mildly decreased function. Grade I diastolic  dysfunction (impaired relaxation). Left atrial size was mildly dilated.     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      Musculoskeletal   Abdominal   Peds  Hematology   Anesthesia Other Findings   Reproductive/Obstetrics                              Anesthesia Physical Anesthesia Plan  ASA: 5 and emergent  Anesthesia Plan: General   Post-op Pain Management: Minimal or no pain anticipated   Induction: Inhalational  PONV Risk Score and Plan: 2 and Treatment may vary due to age or medical condition  Airway Management Planned: Oral ETT  Additional Equipment: Arterial line and CVP  Intra-op Plan:   Post-operative Plan: Post-operative intubation/ventilation  Informed Consent:      History available from chart only  Plan Discussed with: CRNA and Anesthesiologist  Anesthesia Plan Comments:        Anesthesia Quick Evaluation

## 2023-03-15 NOTE — Consult Note (Incomplete)
Cardiology Consultation   Patient ID: Shane Sampson MRN: [id number]; DOB: 01-17-1956  Admit date: 03/15/2023 Date of Consult: 03/15/2023  PCP:  Shane Ran, MD   [facility] Providers Cardiologist:  Little Ishikawa, MD   { Click here to update MD or APP on Care Team, Refresh:1}     Patient Profile:   Shane Sampson is a 67 y.o. male with a hx of *** who is being seen 03/15/2023 for the evaluation of *** at the request of ***.  History of Present Illness:   [name] ***   Past Medical History:  Diagnosis Date  . Hypertension     Past Surgical History:  Procedure Laterality Date  . HERNIA REPAIR    . IR ANGIOGRAM VISCERAL SELECTIVE  03/15/2023  . IR EMBO TUMOR ORGAN ISCHEMIA INFARCT INC GUIDE ROADMAPPING  03/15/2023  . IR US GUIDE VASC ACCESS RIGHT  03/15/2023  . LEFT HEART CATH AND CORONARY ANGIOGRAPHY N/A 03/01/2023   Procedure: LEFT HEART CATH AND CORONARY ANGIOGRAPHY;  Surgeon: Corky Crafts, MD;  Location: [facility] INVASIVE CV LAB;  Service: Cardiovascular;  Laterality: N/A;     {Home Medications (Optional):21181}  Inpatient Medications: Scheduled Meds: . hydrocortisone sod succinate (SOLU-CORTEF) inj  100 mg Intravenous Q8H  . insulin aspart  0-15 Units Subcutaneous Q4H  . mouth rinse  15 mL Mouth Rinse Q2H  . pantoprazole (PROTONIX) IV  40 mg Intravenous Daily   Continuous Infusions: . sodium chloride    . EPINEPHrine (ADRENALIN) 10 mg in sodium chloride 0.9 % 250 mL (0.04 mg/mL) infusion Stopped (03/15/23 1712)  . fentaNYL infusion INTRAVENOUS 200 mcg/hr (03/15/23 2201)  . norepinephrine (LEVOPHED) Adult infusion Stopped (03/15/23 2130)  . propofol (DIPRIVAN) infusion Stopped (03/15/23 2130)  . sodium bicarbonate 150 mEq in dextrose 5 % 1,150 mL infusion 125 mL/hr at 03/15/23 2200  . vasopressin Stopped (03/15/23 2103)   PRN Meds: fentaNYL (SUBLIMAZE) injection, iodixanol, midazolam, mouth rinse  Allergies:   No Known Allergies  Social  History:   Social History   Socioeconomic History  . Marital status: Married    Spouse name: Not on file  . Number of children: Not on file  . Years of education: Not on file  . Highest education level: Not on file  Occupational History  . Not on file  Tobacco Use  . Smoking status: Never  . Smokeless tobacco: Never  Substance and Sexual Activity  . Alcohol use: Yes    Alcohol/week: 1.0 standard drink of alcohol    Types: 1 Standard drinks or equivalent per week  . Drug use: No  . Sexual activity: Not on file  Other Topics Concern  . Not on file  Social History Narrative  . Not on file   Social Determinants of Health   Financial Resource Strain: Not on file  Food Insecurity: No Food Insecurity (03/01/2023)   Hunger Vital Sign   . Worried About Programme researcher, broadcasting/film/video in the Last Year: Never true   . Sampson Out of Food in the Last Year: Never true  Transportation Needs: No Transportation Needs (03/01/2023)   PRAPARE - Transportation   . Lack of Transportation (Medical): No   . Lack of Transportation (Non-Medical): No  Physical Activity: Not on file  Stress: Not on file  Social Connections: Not on file  Intimate Partner Violence: Not At Risk (03/01/2023)   Humiliation, Afraid, Rape, and Kick questionnaire   . Fear of Current or Ex-Partner: No   .  Emotionally Abused: No   . Physically Abused: No   . Sexually Abused: No    Family History:   ***No family history on file.   ROS:  Please see the history of present illness.  *** All other ROS reviewed and negative.     Physical Exam/Data:   Vitals:   03/15/23 2215 03/15/23 2230 03/15/23 2300 03/15/23 2305  BP:      Pulse: (!) 131 (!) 131 (!) 130 (!) 130  Resp: 18 19 20 20   Temp: 98.6 F (37 C) 98.8 F (37.1 C) 99.1 F (37.3 C) 99.1 F (37.3 C)  TempSrc: Bladder Bladder Bladder Bladder  SpO2: 97% 97% 97% 97%  Weight:      Height:        Intake/Output Summary (Last 24 hours) at 03/15/2023 2326 Last data filed at  03/15/2023 2300 Gross per 24 hour  Intake 3631.4 ml  Output --  Net 3631.4 ml      03/15/2023   12:43 PM 03/11/2023    2:23 PM 03/04/2023    4:07 AM  Last 3 Weights  Weight (lbs) 164 lb 166 lb 12.8 oz 164 lb  Weight (kg) 74.39 kg 75.66 kg 74.39 kg     Body mass index is 22.24 kg/m.  General:  Well nourished, well developed, in no acute distress*** HEENT: normal Neck: no JVD Vascular: No carotid bruits; Distal pulses 2+ bilaterally Cardiac:  normal S1, S2; RRR; no murmur *** Lungs:  clear to auscultation bilaterally, no wheezing, rhonchi or rales  Abd: soft, nontender, no hepatomegaly  Ext: no edema Musculoskeletal:  No deformities, BUE and BLE strength normal and equal Skin: warm and dry  Neuro:  CNs 2-12 intact, no focal abnormalities noted Psych:  Normal affect   EKG:  The EKG was personally reviewed and demonstrates:  *** Telemetry:  Telemetry was personally reviewed and demonstrates:  ***  Relevant CV Studies: ***  Laboratory Data:  High Sensitivity Troponin:   Recent Labs  Lab 02/28/23 2115 02/28/23 2335 03/15/23 1251 03/15/23 1646 03/15/23 2030  TROPONINIHS 294* 1,420* 84* 96* 126*     Chemistry Recent Labs  Lab 03/11/23 1518 03/11/23 1518 03/15/23 1251 03/15/23 1552 03/15/23 1942 03/15/23 2030  NA 138   < > 134* 131* 141 139  K 5.7*  --  4.5 6.5* 5.3* 4.6  CL 99  --  102  --   --  107  CO2 21  --  19*  --   --  22  GLUCOSE 145*  --  419*  --   --  166*  BUN 39*  --  57*  --   --  54*  CREATININE 1.74*  --  2.54*  --   --  2.43*  CALCIUM 10.8*  --  8.8*  --   --  7.2*  GFRNONAA  --   --  27*  --   --  28*  ANIONGAP  --   --  13  --   --  10   < > = values in this interval not displayed.    Recent Labs  Lab 03/15/23 1251 03/15/23 2030  PROT 6.2* 4.2*  ALBUMIN 3.4* 2.5*  AST 20 15  ALT 24 21  ALKPHOS 82 54  BILITOT 1.7* 2.0*   Lipids No results for input(s): "CHOL", "TRIG", "HDL", "LABVLDL", "LDLCALC", "CHOLHDL" in the last 168 hours.   Hematology Recent Labs  Lab 03/15/23 1251 03/15/23 1552 03/15/23 1942 03/15/23 2030 03/15/23 2120  WBC  19.5*  --   --  PENDING 57.3*  RBC 4.88  --   --  4.32 4.39  HGB 14.6   < > 12.2* 12.9* 13.6  HCT 45.6   < > 36.0* 37.7* 38.7*  MCV 93.4  --   --  87.3 88.2  MCH 29.9  --   --  29.9 31.0  MCHC 32.0  --   --  34.2 35.1  RDW 13.4  --   --  13.9 14.1  PLT 494*  --   --  272 282   < > = values in this interval not displayed.   Thyroid No results for input(s): "TSH", "FREET4" in the last 168 hours.  BNPNo results for input(s): "BNP", "PROBNP" in the last 168 hours.  DDimer No results for input(s): "DDIMER" in the last 168 hours.   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 The patient presents for emergent arteriography and embolization. EXAM: 1. ULTRASOUND GUIDANCE FOR VASCULAR ACCESS OF THE RIGHT COMMON FEMORAL ARTERY 2. SELECTIVE ARTERIOGRAPHY OF THE LEFT ADRENAL ARTERY (FIRST ORDER OFF OF ABDOMINAL AORTA) 3. PARTICLE EMBOLIZATION OF THE LEFT ADRENAL ARTERY FOR TUMOR ISCHEMIA MEDICATIONS: No additional medications. ANESTHESIA/SEDATION: The patient was intubated during the procedure and monitored by the Department of Anesthesia. CONTRAST:  50 mL Visipaque 320 FLUOROSCOPY TIME:  Radiation Exposure Index (as provided by the fluoroscopic device): 458 mGy Kerma COMPLICATIONS: None immediate. PROCEDURE: Informed consent was obtained from the patient's wife following explanation of the procedure, risks, benefits and alternatives. The patient's wife understands, agrees and consents for the procedure. All questions were addressed. A time out was performed prior to the initiation of the procedure. Maximal barrier sterile technique utilized including caps, mask, sterile gowns, sterile gloves, large sterile drape, hand hygiene, and  chlorhexidine prep. Ultrasound was performed to confirm patency of the right common femoral artery. An ultrasound image was saved and recorded. Under ultrasound guidance, access of the artery was accomplished with a micropuncture set. A 5 French vascular sheath was then advanced into the artery over a guidewire. A 5 French Cobra catheter was advanced into the abdominal aorta. This was then used to selectively catheterize the origin of the left adrenal artery. Selective arteriography was performed. A microcatheter was then advanced through the 5 French catheter into the proximal aspect of the left adrenal artery. Additional selective arteriography was performed. The catheter was then further advanced into the left adrenal artery and further arteriography performed. Particle embolization was performed in the left renal artery with 300-500 micron sized Embosphere particles. Particles were slowly infused by hand injection in diluted contrast. Ultimately, 3/4 vial of particles were instilled. Additional arteriography was performed through the microcatheter. Different micro catheters were utilized in attempting further catheter advancement over guidewires to allow placement of embolization coils into the left adrenal artery. Ultimately microcatheter and 5 French catheter were removed. Hemostasis was obtained at the femoral access site utilizing the Angio-Seal device. FINDINGS: Selective left adrenal arteriography demonstrates abnormal vascularity supplying the known mass of the left adrenal gland with a rounded region of hypervascular enhancement noted. With arteriography, there was no active extravasation of contrast noted or discrete pseudoaneurysms. The left adrenal artery was quite tortuous after takeoff and it was difficult advancing a microcatheter much beyond its initial superiorly oriented segment. Particle embolization was successful in occluding smaller branches supplying tumor and slowing flow significantly  in  the main branches of the left adrenal artery. Microcatheter purchase could not be accomplished beyond the proximal segment to allow safe placement of embolization coils with inability to advance a catheter over a guidewire. Catheter arteriography demonstrates dissection of the proximal vessel likely from catheter and guidewire manipulation which was the likely cause of inability to advance the microcatheter. For this reason, proximal coil embolization of the left renal artery was not performed. This does allow for potential future repeat embolization of the left adrenal artery once the dissection heals, should the patient require future embolization such as preoperative embolization. IMPRESSION: Technically successful microsphere particle embolization of left adrenal arterial supply to a bleeding left adrenal mass. Particle embolization was performed with 3/4 of a vial of 300-500 micron sized Embosphere particles. Proximal coil embolization could not be performed due to inability to advance the microcatheter sufficiently. Arteriography did demonstrate proximal dissection of the left adrenal artery during the procedure which was likely the cause of inability to advance the catheter. Lack of use of embolization coils does allow for potential future embolization of the left adrenal artery once the dissection heals. Electronically Signed   By: Irish Lack M.D.   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 The patient presents for emergent arteriography and embolization. EXAM: 1. ULTRASOUND GUIDANCE FOR VASCULAR ACCESS OF THE RIGHT COMMON FEMORAL ARTERY 2. SELECTIVE ARTERIOGRAPHY OF THE LEFT ADRENAL ARTERY (FIRST ORDER OFF OF ABDOMINAL AORTA) 3. PARTICLE EMBOLIZATION OF THE LEFT ADRENAL ARTERY FOR TUMOR ISCHEMIA MEDICATIONS: No additional medications. ANESTHESIA/SEDATION: The  patient was intubated during the procedure and monitored by the Department of Anesthesia. CONTRAST:  50 mL Visipaque 320 FLUOROSCOPY TIME:  Radiation Exposure Index (as provided by the fluoroscopic device): 458 mGy Kerma COMPLICATIONS: None immediate. PROCEDURE: Informed consent was obtained from the patient's wife following explanation of the procedure, risks, benefits and alternatives. The patient's wife understands, agrees and consents for the procedure. All questions were addressed. A time out was performed prior to the initiation of the procedure. Maximal barrier sterile technique utilized including caps, mask, sterile gowns, sterile gloves, large sterile drape, hand hygiene, and chlorhexidine prep. Ultrasound was performed to confirm patency of the right common femoral artery. An ultrasound image was saved and recorded. Under ultrasound guidance, access of the artery was accomplished with a micropuncture set. A 5 French vascular sheath was then advanced into the artery over a guidewire. A 5 French Cobra catheter was advanced into the abdominal aorta. This was then used to selectively catheterize the origin of the left adrenal artery. Selective arteriography was performed. A microcatheter was then advanced through the 5 French catheter into the proximal aspect of the left adrenal artery. Additional selective arteriography was performed. The catheter was then further advanced into the left adrenal artery and further arteriography performed. Particle embolization was performed in the left renal artery with 300-500 micron sized Embosphere particles. Particles were slowly infused by hand injection in diluted contrast. Ultimately, 3/4 vial of particles were instilled. Additional arteriography was performed through the microcatheter. Different micro catheters were utilized in attempting further catheter advancement over guidewires to allow placement of embolization coils into the left adrenal artery. Ultimately  microcatheter and 5 French catheter were removed. Hemostasis was obtained at the femoral access site utilizing the Angio-Seal device. FINDINGS: Selective left adrenal arteriography demonstrates abnormal vascularity supplying the known mass of the left adrenal gland  with a rounded region of hypervascular enhancement noted. With arteriography, there was no active extravasation of contrast noted or discrete pseudoaneurysms. The left adrenal artery was quite tortuous after takeoff and it was difficult advancing a microcatheter much beyond its initial superiorly oriented segment. Particle embolization was successful in occluding smaller branches supplying tumor and slowing flow significantly in the main branches of the left adrenal artery. Microcatheter purchase could not be accomplished beyond the proximal segment to allow safe placement of embolization coils with inability to advance a catheter over a guidewire. Catheter arteriography demonstrates dissection of the proximal vessel likely from catheter and guidewire manipulation which was the likely cause of inability to advance the microcatheter. For this reason, proximal coil embolization of the left renal artery was not performed. This does allow for potential future repeat embolization of the left adrenal artery once the dissection heals, should the patient require future embolization such as preoperative embolization. IMPRESSION: Technically successful microsphere particle embolization of left adrenal arterial supply to a bleeding left adrenal mass. Particle embolization was performed with 3/4 of a vial of 300-500 micron sized Embosphere particles. Proximal coil embolization could not be performed due to inability to advance the microcatheter sufficiently. Arteriography did demonstrate proximal dissection of the left adrenal artery during the procedure which was likely the cause of inability to advance the catheter. Lack of use of embolization coils does allow for  potential future embolization of the left adrenal artery once the dissection heals. Electronically Signed   By: Irish Lack M.D.   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 The patient presents for emergent arteriography and embolization. EXAM: 1. ULTRASOUND GUIDANCE FOR VASCULAR ACCESS OF THE RIGHT COMMON FEMORAL ARTERY 2. SELECTIVE ARTERIOGRAPHY OF THE LEFT ADRENAL ARTERY (FIRST ORDER OFF OF ABDOMINAL AORTA) 3. PARTICLE EMBOLIZATION OF THE LEFT ADRENAL ARTERY FOR TUMOR ISCHEMIA MEDICATIONS: No additional medications. ANESTHESIA/SEDATION: The patient was intubated during the procedure and monitored by the Department of Anesthesia. CONTRAST:  50 mL Visipaque 320 FLUOROSCOPY TIME:  Radiation Exposure Index (as provided by the fluoroscopic device): 458 mGy Kerma COMPLICATIONS: None immediate. PROCEDURE: Informed consent was obtained from the patient's wife following explanation of the procedure, risks, benefits and alternatives. The patient's wife understands, agrees and consents for the procedure. All questions were addressed. A time out was performed prior to the initiation of the procedure. Maximal barrier sterile technique utilized including caps, mask, sterile gowns, sterile gloves, large sterile drape, hand hygiene, and chlorhexidine prep. Ultrasound was performed to confirm patency of the right common femoral artery. An ultrasound image was saved and recorded. Under ultrasound guidance, access of the artery was accomplished with a micropuncture set. A 5 French vascular sheath was then advanced into the artery over a guidewire. A 5 French Cobra catheter was advanced into the abdominal aorta. This was then used to selectively catheterize the origin of the left adrenal artery. Selective arteriography was performed. A microcatheter was then advanced through the 5 French  catheter into the proximal aspect of the left adrenal artery. Additional selective arteriography was performed. The catheter was then further advanced into the left adrenal artery and further arteriography performed. Particle embolization was performed in the left renal artery with 300-500 micron sized Embosphere particles. Particles were slowly infused by hand injection in diluted contrast. Ultimately, 3/4 vial of particles were instilled. Additional arteriography was performed through the microcatheter. Different  micro catheters were utilized in attempting further catheter advancement over guidewires to allow placement of embolization coils into the left adrenal artery. Ultimately microcatheter and 5 French catheter were removed. Hemostasis was obtained at the femoral access site utilizing the Angio-Seal device. FINDINGS: Selective left adrenal arteriography demonstrates abnormal vascularity supplying the known mass of the left adrenal gland with a rounded region of hypervascular enhancement noted. With arteriography, there was no active extravasation of contrast noted or discrete pseudoaneurysms. The left adrenal artery was quite tortuous after takeoff and it was difficult advancing a microcatheter much beyond its initial superiorly oriented segment. Particle embolization was successful in occluding smaller branches supplying tumor and slowing flow significantly in the main branches of the left adrenal artery. Microcatheter purchase could not be accomplished beyond the proximal segment to allow safe placement of embolization coils with inability to advance a catheter over a guidewire. Catheter arteriography demonstrates dissection of the proximal vessel likely from catheter and guidewire manipulation which was the likely cause of inability to advance the microcatheter. For this reason, proximal coil embolization of the left renal artery was not performed. This does allow for potential future repeat embolization  of the left adrenal artery once the dissection heals, should the patient require future embolization such as preoperative embolization. IMPRESSION: Technically successful microsphere particle embolization of left adrenal arterial supply to a bleeding left adrenal mass. Particle embolization was performed with 3/4 of a vial of 300-500 micron sized Embosphere particles. Proximal coil embolization could not be performed due to inability to advance the microcatheter sufficiently. Arteriography did demonstrate proximal dissection of the left adrenal artery during the procedure which was likely the cause of inability to advance the catheter. Lack of use of embolization coils does allow for potential future embolization of the left adrenal artery once the dissection heals. Electronically Signed   By: Irish Lack M.D.   On: 03/15/2023 19:10   DG Chest Portable 1 View  Result Date: 03/15/2023 CLINICAL DATA:  Central line placement EXAM: PORTABLE CHEST 1 VIEW COMPARISON:  Radiographs 03/15/2023 at 2:20 p.m. FINDINGS: Left subclavian central line tip in the mid SVC. Additional catheter tubing projects over the left axilla. This may represent a PICC line with tip near the left subclavian vein. Remainder unchanged from earlier today. Endotracheal tube tip in the intrathoracic trachea proximally 3.1 cm from the carina. Defibrillator pads. Stable cardiomediastinal silhouette. Aortic atherosclerotic calcification. Low lung volumes accentuate pulmonary vascularity. Bibasilar atelectasis/scarring. No definite pleural effusion. No pneumothorax. Known rib fractures are better assessed on CT. IMPRESSION: Left subclavian central line tip in the mid SVC. Additional catheter tubing projects over the left axilla. This may represent a PICC line with tip near the left subclavian vein. Remainder unchanged from earlier today. Electronically Signed   By: Minerva Fester M.D.   On: 03/15/2023 17:46   DG Abd Portable 1 View  Result  Date: 03/15/2023 CLINICAL DATA:  NG tube placement EXAM: PORTABLE ABDOMEN - 1 VIEW COMPARISON:  Chest radiographs 03/15/2023 FINDINGS: NG tube tip is presumably located within the lower esophagus is only partly visualized on this exam. Recommend advancement 7-8 cm. Remainder unchanged from CT earlier today. IMPRESSION: NG tube tip is presumably located within the lower esophagus however is only partially visualized. Recommend advancement prior to use. These results were called by telephone at the time of interpretation on 03/15/2023 at 3:45 pm to provider [facility] , who verbally acknowledged these results. Electronically Signed   By: Minerva Fester M.D.   On: 03/15/2023  15:52   CT Angio Chest/Abd/Pel for Dissection W and/or Wo Contrast  Result Date: 03/15/2023 CLINICAL DATA:  Upper left abdominal pain nausea and vomiting. Acute aortic syndrome suspected EXAM: CT ANGIOGRAPHY CHEST, ABDOMEN AND PELVIS TECHNIQUE: Non-contrast CT of the chest was initially obtained. Multidetector CT imaging through the chest, abdomen and pelvis was performed using the standard protocol during bolus administration of intravenous contrast. Multiplanar reconstructed images and MIPs were obtained and reviewed to evaluate the vascular anatomy. RADIATION DOSE REDUCTION: This exam was performed according to the departmental dose-optimization program which includes automated exposure control, adjustment of the mA and/or kV according to patient size and/or use of iterative reconstruction technique. CONTRAST:  75mL OMNIPAQUE IOHEXOL 350 MG/ML SOLN COMPARISON:  Chest radiograph 03/15/2023; MRI abdomen 03/03/2023; CTA chest 03/01/2023 FINDINGS: CTA CHEST FINDINGS Cardiovascular: Preferential opacification of the thoracic aorta. No evidence of thoracic aortic aneurysm or dissection. Normal heart size. No pericardial effusion. Mediastinum/Nodes: Endotracheal tube tip in the intrathoracic trachea. Unremarkable esophagus. No lymphadenopathy.  Lungs/Pleura: No pneumothorax. Trace left pleural effusion. Bibasilar atelectasis/scarring. Trachea and central airways are patent. Musculoskeletal: Minimal and nondisplaced fractures of the left anterior 2nd-7th ribs and right anterior 2nd-6th ribs. Review of the MIP images confirms the above findings. CTA ABDOMEN AND PELVIS FINDINGS VASCULAR Aorta: Normal in caliber without dissection, penetrating atherosclerotic ulcer, intramural hematoma. Moderate atherosclerotic plaque without hemodynamically significant stenosis. Celiac: Normal variant with the common hepatic artery arising directly from the aorta. The splenic artery is moderately secondary to mass effect from the hematoma (circa series 7/image 146). SMA: Patent. Renals: Patent without aneurysm or dissection. Calcified plaque causes mild narrowing at the origins of both renal arteries. IMA: Patent. Inflow: Scattered atherosclerotic plaque causes up to mild narrowing. No aneurysm or dissection. Veins: No obvious venous abnormality within the limitations of this arterial phase study. Review of the MIP images confirms the above findings. NON-VASCULAR Hepatobiliary: Cholelithiasis. No evidence of cholecystitis. Unremarkable liver. Pancreas: Unremarkable. Spleen: Unremarkable. Adrenals/Urinary Tract: Heterogenous fluid compatible with blood products within the upper left retroperitoneal space extending inferiorly within the pararenal space appears to originate from the left adrenal mass where there is active hemorrhage. The hemorrhage measures approximately 15.0 x 12.6 x 15.9 cm (AP x ML x CC). The borders of the left adrenal mass or obscured by the adjacent hemorrhage however this measures approximately 6.8 x 7.0 cm in the axial plane (7/164). This measured 6.0 by 5.3 cm on MRI 03/03/2023. Unremarkable right adrenal gland. Hemorrhage surrounds the left kidney without mass effect. Bilateral renal cysts which were previously evaluated with MRI 03/03/2023. No  follow-up is recommended. No urinary calculi or hydronephrosis. Unremarkable bladder. Stomach/Bowel: Normal caliber large and small bowel. Colonic diverticulosis without diverticulitis. Normal appendix. Stomach is within normal limits. Lymphatic: No lymphadenopathy. Reproductive: Unremarkable. Other: No free intraperitoneal air. Musculoskeletal: Acute fracture or destructive osseous lesion sclerotic lesions in the bilateral iliac wings are favored to represent bone islands. Review of the MIP images confirms the above findings. IMPRESSION: 1. 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 2. Nondisplaced fractures of the left anterior 2nd-7th ribs and right anterior 2nd-6th ribs. No pneumothorax. Trace left pleural effusion. 3. Increased size of the left adrenal mass, previously evaluated with MRI 03/03/2023 Aortic Atherosclerosis (ICD10-I70.0). Critical Value/emergent results were called by telephone at the time of interpretation on 03/15/2023 at 3:16 pm to provider JULIE HAVILAND , who verbally acknowledged these results. Electronically Signed   By: Minerva Fester M.D.   On: 03/15/2023 15:24   DG Chest  Portable 1 View  Result Date: 03/15/2023 CLINICAL DATA:  intubation confrimation EXAM: PORTABLE CHEST 1 VIEW COMPARISON:  CXR 02/28/23 FINDINGS: Endotracheal tube terminates at the level of the thoracic inlet approximately 4 cm above the carina. Low lung volumes. No pleural effusion. No pneumothorax no focal airspace opacity. Likely unchanged cardiac and mediastinal contours when accounting for differences in lung volumes. Visualized upper abdomen is unremarkable. IMPRESSION: Endotracheal tube terminates at the level of the thoracic inlet. Low lung volumes. Electronically Signed   By: Lorenza Cambridge M.D.   On: 03/15/2023 14:37     Assessment and Plan:   ***   Risk Assessment/Risk Scores:  {Complete the following score calculators/questions to meet required metrics.  Press F2         :[id number]}   {Is  the patient being seen for unstable angina, ACS, NSTEMI or STEMI?[phone]} {Does this patient have CHF or CHF symptoms?      :[id number]} {Does this patient have ATRIAL FIBRILLATION?[phone]}  {Are we signing off today?:[id number]}  For questions or updates, please contact [facility] Please consult [email or web address] for contact info under    Signed, Ralene Ok, MD  03/15/2023 11:26 PM

## 2023-03-15 NOTE — Progress Notes (Signed)
   Call from [facility] is at capacity and will NOT be able to accept patient Shane Sampson 03/15/2023 . She said she took it all the way upto CMO. They do not do wait list anymore. So, will have to try on daily basis     SIGNATURE    Dr. Kalman Shan, M.D., F.C.C.P,  Pulmonary and Critical Care Medicine Staff Physician, [facility] Center Director - Interstitial Lung Disease  Program  Pulmonary Fibrosis [facility] at [facility] [place], [place], [address]   Pager: [phone], If no answer  -> Check AMION or Try [phone] Telephone (clinical office): [phone] Telephone (research): [phone]  10:26 PM 03/15/2023

## 2023-03-15 NOTE — Consult Note (Signed)
NAME:  Shane Sampson, MRN:  [id number], DOB:  02-16-1956, LOS: 0 ADMISSION DATE:  03/15/2023, CONSULTATION DATE:  03/15/2023 REFERRING MD:  Dr. 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  Recently hospitalized for chest pain on 5/20-5/24, dx with takotsubo cardiomyopathy and Acute HFmrEF. 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 Discharged with outpatient eval (pending surgery referral and metanephrines).   Per wife, patient has felt sick the last 2 days with really low bps (<90/60s) and HR in 120-130s. K+ 5.7 on 5/31 and instructed by cardiology to adjust GDMT. This morning, started to feel very nauseous with "stomach cramping." Wife stepped out to grab lunch when severe LUQ and flank pain started and EMS was called.   On arrival to ED, RR 25, HR 130, and BP86/69. EKG unremarkable. Initial evaluation pertinent for trop 84, WBC 19.5, glucose 419, Na 134, Cr 2.54, and BUN 57. Pt acutely decompensated with brief episode of PEA. Code Med activated, resulting in CPR and intubation. STAT CTA with active hemorrhage 15 x 12.6 x 15.9 cm in upper left retroperitoneal space arising from left adrenal mass.  General surgery and IR consulted for emergent evaluation. PCCM consulted for admission.  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 Wife at beside and gives history. Plans for general surgery to see ASAP.   Objective   Blood pressure 103/76, pulse (!) 122, temperature 97.7 F (36.5 C), temperature source Oral, resp. rate 20, height 6' (1.829 m), weight 74.4 kg, SpO2 100 %.    Vent Mode: PRVC FiO2 (%):  [100 %] 100 % Set Rate:  [20 bmp] 20 bmp Vt Set:  [580 mL] 580 mL PEEP:  [5 cmH20] 5 cmH20 Plateau Pressure:  [14 cmH20] 14 cmH20  No intake or output data in the 24 hours ending 03/15/23 1501 Filed Weights   03/15/23 1243  Weight: 74.4 kg    Examination: General: critically ill patient lying in ED bed, intubated HENT: NCAT, normal conjunctiva, equal pupils Lungs: CTAB, ventilatory sounds Cardiovascular: tachycardia, normal rhythm, no appreciated murmurs, rubs, or gallops, pulses +2 bilaterally Abdomen: soft, unable to assess tenderness (sedated), non distended Extremities: no cyanosis, clubbing, or edema. Skin warm and dry without lesions Neuro: Intubated and sedated, unable to properly assess GU: deferred  Resolved Hospital Problem list     Assessment & Plan:   Active hemorrhage secondary to left adrenal mass  Shock Presented hemodynamically unstable with severe abdominal pain and n/v. CT with 15 x 12.6 x 15.9cm hemorrhage in upper left retroperitoneal space arising from left adrenal mass. Given instability, required pressors and intubation. - General surgery and vascular/IR surgery consulted - Emergent adrenal/renal arteriogram with possible embolization of left adrenal mass.  - Continue pressors and ventilation as needed - Type and screen/ RBC prep  Left adrenal mass c/f pheochromocytoma vs adrenal cortical carcinoma Being evaluated in outpatient setting. Negative estradiol, testosterone, progesterone, and ACTH stimulation test on 5/24. Aldosterone/renin and metanephrines pending.   Cardiac arrest   Multiple rib fractures BP dropped and coded in ED. S/p CPR and epi x1, ROSC achieved. Started on levophed. Stat CT chest with left anterior 2nd-7th rib and right anterior 2nd-6th rib fractures - Continue pressors as needed - Pain control, ICS, further assessment after surgery for rib fractures  HFmrEF  Takotsubo cardiomyopathy  HTN  Dyslipidemia Recently hospitalized 5/20-5/24, dx with above. Trop 86. Home regimen: ASA, statin, coreg, entresto, jardiance, spironolactone. Saw cardiology on 5/31 with K+ 5.7, stopped entresto and started lokelma.  - Cardiology consulted - Holding meds  AKI on CKD III Baseline Cr around 1.5. Arrived with Cr 2.54, secondary to above - CTM, fluids  Prediabetes A1c 6.3. Glucose 419 on arrival. Home jardiance. - CBGs + SSI  BPH Home tamsulosin - holding med  Best Practice (right click and "Reselect all SmartList Selections" daily)   Diet/type: NPO DVT prophylaxis: not indicated GI prophylaxis: PPI Lines: N/A Foley:  N/A Code Status:  full code Last date of multidisciplinary goals of care discussion [Wife at bedside]  Labs   CBC: Recent Labs  Lab 03/15/23 1251  WBC 19.5*  NEUTROABS 15.4*  HGB 14.6  HCT 45.6  MCV 93.4  PLT 494*    Basic Metabolic Panel: Recent Labs  Lab 03/11/23 1518 03/15/23 1251  NA 138 134*  K 5.7* 4.5  CL 99 102  CO2 21 19*  GLUCOSE 145* 419*  BUN 39* 57*  CREATININE 1.74* 2.54*  CALCIUM 10.8* 8.8*   GFR: Estimated Creatinine Clearance: 29.3 mL/min (A) (by C-G formula based on SCr of 2.54 mg/dL (H)). Recent Labs  Lab 03/15/23 1251  WBC 19.5*    Liver Function Tests: Recent Labs  Lab 03/15/23 1251  AST 20  ALT 24  ALKPHOS 82  BILITOT 1.7*  PROT 6.2*  ALBUMIN 3.4*   Recent Labs  Lab 03/15/23 1251  LIPASE 48   No results for input(s): "AMMONIA" in the last 168 hours.  ABG    Component Value Date/Time   TCO2 26 02/28/2023 2134     Coagulation Profile: No results for input(s): "INR",  "PROTIME" in the last 168 hours.  Cardiac Enzymes: No results for input(s): "CKTOTAL", "CKMB", "CKMBINDEX", "TROPONINI" in the last 168 hours.  HbA1C: Hgb A1c MFr Bld  Date/Time Value Ref Range Status  03/01/2023 01:24 AM 6.3 (H) 4.8 - 5.6 % Final    Comment:    (NOTE) Pre diabetes:          5.7%-6.4%  Diabetes:              >6.4%  Glycemic control for   <7.0% adults with diabetes     CBG: Recent Labs  Lab 03/15/23 1407  GLUCAP 292*    Review of Systems:   Unable to obtain, critically ill  Past Medical History:  He,  has a past medical history of Hypertension.   Surgical History:   Past Surgical History:  Procedure Laterality Date   HERNIA REPAIR     LEFT HEART CATH AND CORONARY ANGIOGRAPHY N/A 03/01/2023   Procedure: LEFT HEART CATH AND CORONARY ANGIOGRAPHY;  Surgeon: Corky Crafts, MD;  Location: [facility] INVASIVE CV LAB;  Service: Cardiovascular;  Laterality: N/A;     Social History:   reports that he has never smoked. He has never used smokeless tobacco.  He reports current alcohol use of about 1.0 standard drink of alcohol per week. He reports that he does not use drugs.   Family History:  His family history is not on file.   Allergies No Known Allergies   Home Medications  Prior to Admission medications   Medication Sig Start Date End Date Taking? Authorizing Provider  aspirin 81 MG tablet Take 81 mg by mouth daily.    [provider]  atorvastatin (LIPITOR) 80 MG tablet Take 1 tablet (80 mg total) by mouth daily. 03/05/23   Arty Baumgartner, NP  carvedilol (COREG) 6.25 MG tablet Take 1 tablet (6.25 mg total) by mouth 2 (two) times daily. 03/11/23   Carlos Levering, NP  empagliflozin (JARDIANCE) 10 MG TABS tablet Take 1 tablet (10 mg total) by mouth daily. 03/05/23   Arty Baumgartner, NP  ezetimibe (ZETIA) 10 MG tablet Take 10 mg by mouth daily.    [provider]  nitroGLYCERIN (NITROSTAT) 0.4 MG SL tablet Place 1 tablet (0.4 mg  total) under the tongue every 5 (five) minutes x 3 doses as needed for chest pain. 03/04/23   Arty Baumgartner, NP  sacubitril-valsartan (ENTRESTO) 24-26 MG Take 1 tablet by mouth 2 (two) times daily. 03/04/23   Arty Baumgartner, NP  spironolactone (ALDACTONE) 25 MG tablet Take 1/2 tablet (12.5 mg total) by mouth daily. 03/05/23   Arty Baumgartner, NP  tamsulosin (FLOMAX) 0.4 MG CAPS capsule Take 0.4 mg by mouth daily.    [provider]     Critical care time

## 2023-03-15 NOTE — Sedation Documentation (Addendum)
Patient case under care of anesthesia.

## 2023-03-15 NOTE — Procedures (Signed)
Central Venous Catheter Insertion Procedure Note  Shane Sampson  [id number]  03-23-1956  Date:03/15/23  Time:4:58 PM   Provider Performing:Keyuana Wank   Procedure: Insertion of Non-tunneled Central Venous [phone]) with US guidance ([id number])   Indication(s) Medication administration  Consent Risks of the procedure as well as the alternatives and risks of each were explained to the patient and/or caregiver.  Consent for the procedure was obtained and is signed in the bedside chart  Anesthesia Topical only with 1% lidocaine   Timeout Verified patient identification, verified procedure, site/side was marked, verified correct patient position, special equipment/implants available, medications/allergies/relevant history reviewed, required imaging and test results available.  Sterile Technique Maximal sterile technique including full sterile barrier drape, hand hygiene, sterile gown, sterile gloves, mask, hair covering, sterile ultrasound probe cover (if used).  Procedure Description Area of catheter insertion was cleaned with chlorhexidine and draped in sterile fashion.  With real-time ultrasound guidance a central venous catheter was placed into the left subclavian vein. Nonpulsatile blood flow and easy flushing noted in all ports.  The catheter was sutured in place and sterile dressing applied.  Complications/Tolerance None; patient tolerated the procedure well. Chest X-ray is ordered to verify placement for internal jugular or subclavian cannulation.   Chest x-ray is not ordered for femoral cannulation.  EBL Minimal  Specimen(s) None

## 2023-03-15 NOTE — ED Notes (Signed)
Patient transported to CT 

## 2023-03-15 NOTE — Consult Note (Addendum)
Chief Complaint: Patient was seen in consultation today for adrenal/renal arteriogram with possible embolization Chief Complaint  Patient presents with   Abdominal Pain   at the request of Dr Particia Nearing   Supervising Physician: Irish Lack  Patient Status: [facility] - ED  History of Present Illness: Shane Sampson is a 67 y.o. 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 2. Nondisplaced fractures of the left anterior 2nd-7th ribs and right anterior 2nd-6th ribs. No pneumothorax. Trace left pleural effusion. 3. Increased size of the left adrenal mass, previously evaluated with MRI 03/03/2023  Adrenal/renal arteriogram with possible left adrenal mass embolization to be performed in IR asap per Dr Fredia Sorrow    Past Medical History:  Diagnosis Date   Hypertension     Past Surgical History:  Procedure Laterality Date   HERNIA REPAIR     LEFT HEART CATH AND CORONARY ANGIOGRAPHY N/A 03/01/2023   Procedure: LEFT HEART CATH AND CORONARY ANGIOGRAPHY;  Surgeon: Corky Crafts, MD;  Location: [facility] INVASIVE CV LAB;  Service: Cardiovascular;  Laterality: N/A;    Allergies: Patient has no known allergies.  Medications: Prior to Admission medications   Medication Sig Start Date End Date Taking? Authorizing Provider  aspirin 81 MG tablet Take 81 mg by mouth daily.    [provider]  atorvastatin (LIPITOR) 80 MG tablet Take 1 tablet (80 mg total) by mouth daily. 03/05/23   Arty Baumgartner, NP  carvedilol (COREG) 6.25 MG tablet Take 1 tablet (6.25 mg total) by mouth 2 (two) times daily. 03/11/23   Carlos Levering, NP  empagliflozin (JARDIANCE) 10 MG TABS tablet Take 1 tablet (10 mg total) by mouth daily.  03/05/23   Arty Baumgartner, NP  ezetimibe (ZETIA) 10 MG tablet Take 10 mg by mouth daily.    [provider]  nitroGLYCERIN (NITROSTAT) 0.4 MG SL tablet Place 1 tablet (0.4 mg total) under the tongue every 5 (five) minutes x 3 doses as needed for chest pain. 03/04/23   Arty Baumgartner, NP  sacubitril-valsartan (ENTRESTO) 24-26 MG Take 1 tablet by mouth 2 (two) times daily. 03/04/23   Arty Baumgartner, NP  spironolactone (ALDACTONE) 25 MG tablet Take 1/2 tablet (12.5 mg total) by mouth daily. 03/05/23   Arty Baumgartner, NP  tamsulosin (FLOMAX) 0.4 MG CAPS capsule Take 0.4 mg by mouth daily.    [provider]     No family history on file.  Social History   Socioeconomic History   Marital status: Married    Spouse name: Not on file   Number of children: Not on file   Years of education: Not on file   Highest education level: Not on file  Occupational History   Not on file  Tobacco Use   Smoking status: Never   Smokeless tobacco: Never  Substance and Sexual Activity   Alcohol use: Yes    Alcohol/week: 1.0 standard drink of alcohol    Types: 1 Standard drinks or equivalent per week   Drug use: No   Sexual activity: Not on file  Other Topics Concern   Not on file  Social History Narrative   Not on file   Social Determinants of Health  Financial Resource Strain: Not on file  Food Insecurity: No Food Insecurity (03/01/2023)   Hunger Vital Sign    Worried About Running Out of Food in the Last Year: Never true    Ran Out of Food in the Last Year: Never true  Transportation Needs: No Transportation Needs (03/01/2023)   PRAPARE - Administrator, Civil Service (Medical): No    Lack of Transportation (Non-Medical): No  Physical Activity: Not on file  Stress: Not on file  Social Connections: Not on file    Review of Systems: A 12 point ROS discussed and pertinent positives are indicated in the HPI above.  All other systems are negative.  Vital  Signs: BP 97/79   Pulse (!) 125   Temp 97.7 F (36.5 C) (Oral)   Resp (!) 28   Ht 6' (1.829 m)   Wt 164 lb (74.4 kg)   SpO2 100%   BMI 22.24 kg/m   Advance Care Plan: The advanced care plan/surrogate decision maker was discussed at the time of visit and documented in the medical record.    Physical Exam Vitals reviewed.  Constitutional:      Comments: Intubated; on pressors  Cardiovascular:     Rate and Rhythm: Regular rhythm.  Pulmonary:     Comments: Intubated  Skin:    General: Skin is warm.  Psychiatric:     Comments: Wife at bedside Consented for procedure      Imaging: DG Abd Portable 1 View  Result Date: 03/15/2023 CLINICAL DATA:  NG tube placement EXAM: PORTABLE ABDOMEN - 1 VIEW COMPARISON:  Chest radiographs 03/15/2023 FINDINGS: NG tube tip is presumably located within the lower esophagus is only partly visualized on this exam. Recommend advancement 7-8 cm. Remainder unchanged from CT earlier today. IMPRESSION: NG tube tip is presumably located within the lower esophagus however is only partially visualized. Recommend advancement prior to use. These results were called by telephone at the time of interpretation on 03/15/2023 at 3:45 pm to provider [facility] , who verbally acknowledged these results. Electronically Signed   By: Minerva Fester M.D.   On: 03/15/2023 15:52   CT Angio Chest/Abd/Pel for Dissection W and/or Wo Contrast  Result Date: 03/15/2023 CLINICAL DATA:  Upper left abdominal pain nausea and vomiting. Acute aortic syndrome suspected EXAM: CT ANGIOGRAPHY CHEST, ABDOMEN AND PELVIS TECHNIQUE: Non-contrast CT of the chest was initially obtained. Multidetector CT imaging through the chest, abdomen and pelvis was performed using the standard protocol during bolus administration of intravenous contrast. Multiplanar reconstructed images and MIPs were obtained and reviewed to evaluate the vascular anatomy. RADIATION DOSE REDUCTION: This exam was performed  according to the departmental dose-optimization program which includes automated exposure control, adjustment of the mA and/or kV according to patient size and/or use of iterative reconstruction technique. CONTRAST:  75mL OMNIPAQUE IOHEXOL 350 MG/ML SOLN COMPARISON:  Chest radiograph 03/15/2023; MRI abdomen 03/03/2023; CTA chest 03/01/2023 FINDINGS: CTA CHEST FINDINGS Cardiovascular: Preferential opacification of the thoracic aorta. No evidence of thoracic aortic aneurysm or dissection. Normal heart size. No pericardial effusion. Mediastinum/Nodes: Endotracheal tube tip in the intrathoracic trachea. Unremarkable esophagus. No lymphadenopathy. Lungs/Pleura: No pneumothorax. Trace left pleural effusion. Bibasilar atelectasis/scarring. Trachea and central airways are patent. Musculoskeletal: Minimal and nondisplaced fractures of the left anterior 2nd-7th ribs and right anterior 2nd-6th ribs. Review of the MIP images confirms the above findings. CTA ABDOMEN AND PELVIS FINDINGS VASCULAR Aorta: Normal in caliber without dissection, penetrating atherosclerotic ulcer, intramural hematoma. Moderate atherosclerotic plaque without hemodynamically  significant stenosis. Celiac: Normal variant with the common hepatic artery arising directly from the aorta. The splenic artery is moderately secondary to mass effect from the hematoma (circa series 7/image 146). SMA: Patent. Renals: Patent without aneurysm or dissection. Calcified plaque causes mild narrowing at the origins of both renal arteries. IMA: Patent. Inflow: Scattered atherosclerotic plaque causes up to mild narrowing. No aneurysm or dissection. Veins: No obvious venous abnormality within the limitations of this arterial phase study. Review of the MIP images confirms the above findings. NON-VASCULAR Hepatobiliary: Cholelithiasis. No evidence of cholecystitis. Unremarkable liver. Pancreas: Unremarkable. Spleen: Unremarkable. Adrenals/Urinary Tract: Heterogenous fluid  compatible with blood products within the upper left retroperitoneal space extending inferiorly within the pararenal space appears to originate from the left adrenal mass where there is active hemorrhage. The hemorrhage measures approximately 15.0 x 12.6 x 15.9 cm (AP x ML x CC). The borders of the left adrenal mass or obscured by the adjacent hemorrhage however this measures approximately 6.8 x 7.0 cm in the axial plane (7/164). This measured 6.0 by 5.3 cm on MRI 03/03/2023. Unremarkable right adrenal gland. Hemorrhage surrounds the left kidney without mass effect. Bilateral renal cysts which were previously evaluated with MRI 03/03/2023. No follow-up is recommended. No urinary calculi or hydronephrosis. Unremarkable bladder. Stomach/Bowel: Normal caliber large and small bowel. Colonic diverticulosis without diverticulitis. Normal appendix. Stomach is within normal limits. Lymphatic: No lymphadenopathy. Reproductive: Unremarkable. Other: No free intraperitoneal air. Musculoskeletal: Acute fracture or destructive osseous lesion sclerotic lesions in the bilateral iliac wings are favored to represent bone islands. Review of the MIP images confirms the above findings. IMPRESSION: 1. 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 2. Nondisplaced fractures of the left anterior 2nd-7th ribs and right anterior 2nd-6th ribs. No pneumothorax. Trace left pleural effusion. 3. Increased size of the left adrenal mass, previously evaluated with MRI 03/03/2023 Aortic Atherosclerosis (ICD10-I70.0). Critical Value/emergent results were called by telephone at the time of interpretation on 03/15/2023 at 3:16 pm to provider JULIE HAVILAND , who verbally acknowledged these results. Electronically Signed   By: Minerva Fester M.D.   On: 03/15/2023 15:24   DG Chest Portable 1 View  Result Date: 03/15/2023 CLINICAL DATA:  intubation confrimation EXAM: PORTABLE CHEST 1 VIEW COMPARISON:  CXR 02/28/23 FINDINGS: Endotracheal tube  terminates at the level of the thoracic inlet approximately 4 cm above the carina. Low lung volumes. No pleural effusion. No pneumothorax no focal airspace opacity. Likely unchanged cardiac and mediastinal contours when accounting for differences in lung volumes. Visualized upper abdomen is unremarkable. IMPRESSION: Endotracheal tube terminates at the level of the thoracic inlet. Low lung volumes. Electronically Signed   By: Lorenza Cambridge M.D.   On: 03/15/2023 14:37   MR ABDOMEN W WO CONTRAST  Result Date: 03/03/2023 CLINICAL DATA:  Left adrenal mass on chest CTA. No given history of malignancy. EXAM: MRI ABDOMEN WITHOUT AND WITH CONTRAST TECHNIQUE: Multiplanar multisequence MR imaging of the abdomen was performed both before and after the administration of intravenous contrast. CONTRAST:  7.44mL GADAVIST GADOBUTROL 1 MMOL/ML IV SOLN COMPARISON:  Chest CTA 03/01/2023. No other relevant prior imaging which includes the left adrenal gland available. FINDINGS: Lower chest: Mild dependent atelectasis at both lung bases. The visualized lower chest is otherwise unremarkable. Hepatobiliary: The liver is normal in morphology without evidence of cirrhosis or significant steatosis. There are no focal liver lesions. There is a 1.1 cm dependent gallstone. No evidence of gallbladder wall thickening, surrounding inflammation, choledocholithiasis or biliary dilatation. No evidence of gallstones,  gallbladder wall thickening or biliary dilatation. Pancreas: Unremarkable. No pancreatic ductal dilatation or surrounding inflammatory changes. Spleen: Normal in size without focal abnormality. Adrenals/Urinary Tract: As demonstrated on recent chest CTA, there is a large left adrenal mass. This measures 6.0 x 5.3 cm transverse on image 25/5 and 5.7 cm on coronal image 21/4. This mass demonstrates mildly heterogeneous T1 signal, more heterogeneous T2 hyperintensity, and heterogeneous arterial phase enhancement following contrast. There  is a fat plane between this lesion and the adjacent kidney and pancreatic tail. The right adrenal gland appears normal. There are simple and mildly complex renal cysts bilaterally, including a simple cyst in the upper pole of the right kidney measuring 3.8 cm on image 25/5. In the interpolar region of the left kidney, there is a 2.2 cm cyst which contains a fluid-fluid level. No enhancing renal masses or hydronephrosis. No specific imaging follow-up of the renal lesions is recommended. Stomach/Bowel: The stomach appears unremarkable for its degree of distension. No evidence of bowel wall thickening, distention or surrounding inflammatory change. Vascular/Lymphatic: There are no enlarged abdominal lymph nodes. Aortic and branch vessel atherosclerosis without evidence of aneurysm. No evidence of renal vein or IVC tumor thrombus. The portal, superior mesenteric and splenic veins appear unremarkable. Other: No ascites or peritoneal nodularity. Musculoskeletal: No acute or significant osseous findings. No evidence of osseous metastatic disease. Mild lumbar spondylosis. IMPRESSION: 1. Large enhancing left adrenal mass, with imaging features suspicious for a pheochromocytoma or adrenal cortical carcinoma. Metastatic disease is considered less likely. Biochemical testing for possible pheochromocytoma recommended prior to tissue sampling. 2. No evidence of metastatic disease in the abdomen. 3. Simple and mildly complex renal cysts bilaterally. 4. Cholelithiasis without evidence of cholecystitis or biliary dilatation. Electronically Signed   By: Carey Bullocks M.D.   On: 03/03/2023 16:06   MR CARDIAC MORPHOLOGY W WO CONTRAST  Result Date: 03/02/2023 CLINICAL DATA:  30M p/w chest pain, troponin up to 1420. Cath shows nonobstructive CAD. Echo with EF 45-50%, with hypokinesis of mid segments EXAM: CARDIAC MRI TECHNIQUE: The patient was scanned on a 1.5 Tesla Siemens magnet. A dedicated cardiac coil was used. Functional  imaging was done using Fiesta sequences. 2,3, and 4 chamber views were done to assess for RWMA's. Modified Simpson's rule using a short axis stack was used to calculate an ejection fraction on a dedicated work [employer]. The patient received 10 cc of Gadavist. After 10 minutes inversion recovery sequences were used to assess for infiltration and scar tissue. Phase contrast velocity mapping was performed above the aortic and pulmonic valves CONTRAST:  10 cc  of Gadavist FINDINGS: Left ventricle: -Normal size -Asymmetric hypertrophy measuring 17mm in basal septum (8mm in posterior wall) -Moderate systolic dysfunction.  Hypokinesis of mid segments -Elevated ECV (30%).  Normal T2 values -RV insertion site LGE. LGE accounts for 2% of total myocardial mass LV EF: 34% (Normal 49-79%) Absolute volumes: LV EDV: (Normal 95-215 mL) LV ESV: (Normal 25-85 mL) LV SV: 60mL (Normal 61-145 mL) CO: 6.7L/min (Normal 3.4-7.8 L/min) Indexed volumes: LV EDV: 4mL/sq-m (Normal 50-108 mL/sq-m) LV ESV: 57mL/sq-m (Normal 11-47 mL/sq-m) LV SV: 4mL/sq-m (Normal 33-72 mL/sq-m) CI: 3.4L/min/sq-m (Normal 1.8-4.2 L/min/sq-m) Right ventricle: Normal size and systolic function RV EF:  50% (Normal 51-80%) Absolute volumes: RV EDV: (Normal 109-217 mL) RV ESV: 63mL (Normal 23-91 mL) RV SV: 63mL (Normal 71-141 mL) CO: 6.9L/min (Normal 2.8-8.8 L/min) Indexed volumes: RV EDV: 64mL/sq-m (Normal 58-109 mL/sq-m) RV ESV: 48mL/sq-m (Normal 12-46 mL/sq-m) RV SV:  29mL/sq-m (Normal 38-71 mL/sq-m) CI: 3.5L/min/sq-m (Normal 1.7-4.2 L/min/sq-m) Left atrium: Normal size Right atrium: Normal size Mitral valve: Trivial regurgitation Aortic valve: Trivial regurgitation Tricuspid valve: Trivial regurgitation Pulmonic valve: Trivial regurgitation Aorta: Normal proximal ascending aorta Pericardium: Normal IMPRESSION: 1. There is hypokinesis of all LV mid ventricular segments, which considering presentation with troponin elevation and  normal coronary arteries on cath, suggests mid ventricular Takotsubo cardiomyopathy 2. Asymmetric LV hypertrophy measuring 17mm in basal septum (8mm in posterior wall), meeting criteria for hypertrophic cardiomyopathy 3. RV insertion site LGE, which is commonly seen in HCM. LGE accounts for 2% of total myocardial mass 4.  Normal LV size with moderate systolic dysfunction (EF 34%) 5.  Normal RV size and systolic function (EF 50%) Electronically Signed   By: Epifanio Lesches M.D.   On: 03/02/2023 23:36   MR CARDIAC VELOCITY FLOW MAP  Result Date: 03/02/2023 CLINICAL DATA:  78M p/w chest pain, troponin up to 1420. Cath shows nonobstructive CAD. Echo with EF 45-50%, with hypokinesis of mid segments EXAM: CARDIAC MRI TECHNIQUE: The patient was scanned on a 1.5 Tesla Siemens magnet. A dedicated cardiac coil was used. Functional imaging was done using Fiesta sequences. 2,3, and 4 chamber views were done to assess for RWMA's. Modified Simpson's rule using a short axis stack was used to calculate an ejection fraction on a dedicated work [employer] officer, [employer]. The patient received 10 cc of Gadavist. After 10 minutes inversion recovery sequences were used to assess for infiltration and scar tissue. Phase contrast velocity mapping was performed above the aortic and pulmonic valves CONTRAST:  10 cc  of Gadavist FINDINGS: Left ventricle: -Normal size -Asymmetric hypertrophy measuring 17mm in basal septum (8mm in posterior wall) -Moderate systolic dysfunction.  Hypokinesis of mid segments -Elevated ECV (30%).  Normal T2 values -RV insertion site LGE. LGE accounts for 2% of total myocardial mass LV EF: 34% (Normal 49-79%) Absolute volumes: LV EDV: (Normal 95-215 mL) LV ESV: (Normal 25-85 mL) LV SV: 60mL (Normal 61-145 mL) CO: 6.7L/min (Normal 3.4-7.8 L/min) Indexed volumes: LV EDV: 47mL/sq-m (Normal 50-108 mL/sq-m) LV ESV: 54mL/sq-m (Normal 11-47 mL/sq-m) LV SV: 36mL/sq-m (Normal 33-72 mL/sq-m) CI:  3.4L/min/sq-m (Normal 1.8-4.2 L/min/sq-m) Right ventricle: Normal size and systolic function RV EF:  50% (Normal 51-80%) Absolute volumes: RV EDV: (Normal 109-217 mL) RV ESV: 63mL (Normal 23-91 mL) RV SV: 63mL (Normal 71-141 mL) CO: 6.9L/min (Normal 2.8-8.8 L/min) Indexed volumes: RV EDV: 67mL/sq-m (Normal 58-109 mL/sq-m) RV ESV: 68mL/sq-m (Normal 12-46 mL/sq-m) RV SV: 61mL/sq-m (Normal 38-71 mL/sq-m) CI: 3.5L/min/sq-m (Normal 1.7-4.2 L/min/sq-m) Left atrium: Normal size Right atrium: Normal size Mitral valve: Trivial regurgitation Aortic valve: Trivial regurgitation Tricuspid valve: Trivial regurgitation Pulmonic valve: Trivial regurgitation Aorta: Normal proximal ascending aorta Pericardium: Normal IMPRESSION: 1. There is hypokinesis of all LV mid ventricular segments, which considering presentation with troponin elevation and normal coronary arteries on cath, suggests mid ventricular Takotsubo cardiomyopathy 2. Asymmetric LV hypertrophy measuring 17mm in basal septum (8mm in posterior wall), meeting criteria for hypertrophic cardiomyopathy 3. RV insertion site LGE, which is commonly seen in HCM. LGE accounts for 2% of total myocardial mass 4.  Normal LV size with moderate systolic dysfunction (EF 34%) 5.  Normal RV size and systolic function (EF 50%) Electronically Signed   By: Epifanio Lesches M.D.   On: 03/02/2023 23:36   MR CARDIAC VELOCITY FLOW MAP  Result Date: 03/02/2023 CLINICAL DATA:  78M p/w chest pain, troponin up to 1420. Cath shows nonobstructive CAD. Echo with EF  45-50%, with hypokinesis of mid segments EXAM: CARDIAC MRI TECHNIQUE: The patient was scanned on a 1.5 Tesla Siemens magnet. A dedicated cardiac coil was used. Functional imaging was done using Fiesta sequences. 2,3, and 4 chamber views were done to assess for RWMA's. Modified Simpson's rule using a short axis stack was used to calculate an ejection fraction on a dedicated work [employer] officer, [employer]. The patient  received 10 cc of Gadavist. After 10 minutes inversion recovery sequences were used to assess for infiltration and scar tissue. Phase contrast velocity mapping was performed above the aortic and pulmonic valves CONTRAST:  10 cc  of Gadavist FINDINGS: Left ventricle: -Normal size -Asymmetric hypertrophy measuring 17mm in basal septum (8mm in posterior wall) -Moderate systolic dysfunction.  Hypokinesis of mid segments -Elevated ECV (30%).  Normal T2 values -RV insertion site LGE. LGE accounts for 2% of total myocardial mass LV EF: 34% (Normal 49-79%) Absolute volumes: LV EDV: (Normal 95-215 mL) LV ESV: (Normal 25-85 mL) LV SV: 60mL (Normal 61-145 mL) CO: 6.7L/min (Normal 3.4-7.8 L/min) Indexed volumes: LV EDV: 44mL/sq-m (Normal 50-108 mL/sq-m) LV ESV: 32mL/sq-m (Normal 11-47 mL/sq-m) LV SV: 44mL/sq-m (Normal 33-72 mL/sq-m) CI: 3.4L/min/sq-m (Normal 1.8-4.2 L/min/sq-m) Right ventricle: Normal size and systolic function RV EF:  50% (Normal 51-80%) Absolute volumes: RV EDV: (Normal 109-217 mL) RV ESV: 63mL (Normal 23-91 mL) RV SV: 63mL (Normal 71-141 mL) CO: 6.9L/min (Normal 2.8-8.8 L/min) Indexed volumes: RV EDV: 55mL/sq-m (Normal 58-109 mL/sq-m) RV ESV: 26mL/sq-m (Normal 12-46 mL/sq-m) RV SV: 101mL/sq-m (Normal 38-71 mL/sq-m) CI: 3.5L/min/sq-m (Normal 1.7-4.2 L/min/sq-m) Left atrium: Normal size Right atrium: Normal size Mitral valve: Trivial regurgitation Aortic valve: Trivial regurgitation Tricuspid valve: Trivial regurgitation Pulmonic valve: Trivial regurgitation Aorta: Normal proximal ascending aorta Pericardium: Normal IMPRESSION: 1. There is hypokinesis of all LV mid ventricular segments, which considering presentation with troponin elevation and normal coronary arteries on cath, suggests mid ventricular Takotsubo cardiomyopathy 2. Asymmetric LV hypertrophy measuring 17mm in basal septum (8mm in posterior wall), meeting criteria for hypertrophic cardiomyopathy 3. RV insertion site LGE, which is  commonly seen in HCM. LGE accounts for 2% of total myocardial mass 4.  Normal LV size with moderate systolic dysfunction (EF 34%) 5.  Normal RV size and systolic function (EF 50%) Electronically Signed   By: Epifanio Lesches M.D.   On: 03/02/2023 23:36   CT Angio Chest Pulmonary Embolism (PE) W or WO Contrast  Result Date: 03/01/2023 CLINICAL DATA:  Recurrent chest pain EXAM: CT ANGIOGRAPHY CHEST WITH CONTRAST TECHNIQUE: Multidetector CT imaging of the chest was performed using the standard protocol during bolus administration of intravenous contrast. Multiplanar CT image reconstructions and MIPs were obtained to evaluate the vascular anatomy. RADIATION DOSE REDUCTION: This exam was performed according to the departmental dose-optimization program which includes automated exposure control, adjustment of the mA and/or kV according to patient size and/or use of iterative reconstruction technique. CONTRAST:  60mL OMNIPAQUE IOHEXOL 350 MG/ML SOLN COMPARISON:  Chest x-ray 02/28/2023 FINDINGS: Cardiovascular: Satisfactory opacification of the pulmonary arteries to the segmental level. No evidence of pulmonary embolism. Normal heart size. No pericardial effusion. Nonaneurysmal aorta. Moderate aortic atherosclerosis. Coronary vascular calcifications. Mediastinum/Nodes: Midline trachea. No thyroid mass. No suspicious lymph nodes. Esophagus within normal limits aside from small hiatal hernia. Mild distal esophageal thickening. Lungs/Pleura: Lungs are clear. No pleural effusion or pneumothorax. Upper Abdomen: Gallstones. Incompletely visualized left upper quadrant mass measuring 5.2 x 4.4 cm with density values of 24, this appears to be associated with left adrenal gland.  Musculoskeletal: No acute osseous abnormality Review of the MIP images confirms the above findings. IMPRESSION: 1. Negative for acute pulmonary embolus or aortic dissection. Clear lung fields. 2. Incompletely visualized 5.2 cm left upper quadrant  mass which appears to be associated with the left adrenal gland, possible malignancy. Surgical consultation is recommended. Consider biochemical lab evaluation for functional status and pheochromocytoma prior to resection. 3. Gallstones. 4. Aortic atherosclerosis. Aortic Atherosclerosis (ICD10-I70.0). Electronically Signed   By: Jasmine Pang M.D.   On: 03/01/2023 18:52   ECHOCARDIOGRAM COMPLETE  Result Date: 03/01/2023    ECHOCARDIOGRAM REPORT   Patient Name:   SHENANDOAH NORDLAND Date of Exam: 03/01/2023 Medical Rec #:  [id number]     Height:       72.0 in Accession #:    [id number]    Weight:       172.6 lb Date of Birth:  [birth year]     BSA:          2.001 m Patient Age:    67 years      BP:           132/78 mmHg Patient Gender: M             HR:           93 bpm. Exam Location:  Inpatient Procedure: 2D Echo, Cardiac Doppler and Color Doppler Indications:    Acute ischemic heart disease  History:        Patient has prior history of Echocardiogram examinations. Risk                 Factors:Hypertension.  Sonographer:    Mike Gip Referring Phys: [id number] Donnie Coffin VARANASI IMPRESSIONS  1. Atypical pattern of abnormal wall motion involving the mid segments of the heart with preserved apical contractility. 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 Cannot exclude myocarditis. Left ventricular ejection fraction, by estimation, is 45 to 50%. The left ventricle has mildly decreased function. The left ventricle demonstrates regional wall motion abnormalities (see scoring diagram/findings for description). Left ventricular diastolic parameters are consistent with Grade I diastolic  dysfunction (impaired relaxation).  2. Right ventricular systolic function is normal. The right ventricular size is normal. Tricuspid regurgitation signal is inadequate for assessing PA pressure.  3. Left atrial size was mildly dilated.  4. The mitral valve is normal in structure. No evidence of mitral valve regurgitation. No evidence  of mitral stenosis.  5. The aortic valve is tricuspid. Aortic valve regurgitation is not visualized. No aortic stenosis is present. Comparison(s): Prior images reviewed side by side. The left ventricular function is worsened. The left ventricular diastolic function has improved. The left ventricular wall motion abnormalities are new. FINDINGS  Left Ventricle: Atypical pattern of abnormal wall motion involving the mid segments of the heart with preserved apical contractility. 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 Cannot exclude myocarditis. Left ventricular ejection fraction, by estimation, is 45 to 50%. The left ventricle has mildly decreased function. The left ventricle demonstrates regional wall motion abnormalities. The left ventricular internal cavity size was normal in size. There is no left ventricular hypertrophy. Left ventricular diastolic parameters are consistent with Grade I diastolic dysfunction (impaired relaxation). Normal left ventricular filling pressure.  LV Wall Scoring: The mid anteroseptal segment, mid inferolateral segment, mid anterolateral segment, mid inferoseptal segment, mid anterior segment, and mid inferior segment are hypokinetic. The entire apex, basal anteroseptal segment, basal inferolateral segment, basal anterolateral segment, basal anterior segment, basal inferior segment, and basal  inferoseptal segment are normal. Right Ventricle: The right ventricular size is normal. No increase in right ventricular wall thickness. Right ventricular systolic function is normal. Tricuspid regurgitation signal is inadequate for assessing PA pressure. Left Atrium: Left atrial size was mildly dilated. Right Atrium: Right atrial size was normal in size. Pericardium: There is no evidence of pericardial effusion. Mitral Valve: The mitral valve is normal in structure. No evidence of mitral valve regurgitation. No evidence of mitral valve stenosis. Tricuspid Valve: The tricuspid valve is  normal in structure. Tricuspid valve regurgitation is not demonstrated. Aortic Valve: The aortic valve is tricuspid. Aortic valve regurgitation is not visualized. No aortic stenosis is present. Pulmonic Valve: The pulmonic valve was grossly normal. Pulmonic valve regurgitation is not visualized. Aorta: The aortic root and ascending aorta are structurally normal, with no evidence of dilitation. IAS/Shunts: No atrial level shunt detected by color flow Doppler.  LEFT VENTRICLE PLAX 2D LVIDd:         4.60 cm      Diastology LVIDs:         3.30 cm      LV e' medial:    5.77 cm/s LV PW:         1.00 cm      LV E/e' medial:  8.9 LV IVS:        1.10 cm      LV e' lateral:   8.81 cm/s                             LV E/e' lateral: 5.8  LV Volumes (MOD) LV vol d, MOD A2C: 104.0 ml LV vol d, MOD A4C: 116.0 ml LV vol s, MOD A2C: 51.8 ml LV vol s, MOD A4C: 66.8 ml LV SV MOD A2C:     52.2 ml LV SV MOD A4C:     116.0 ml LV SV MOD BP:      52.6 ml RIGHT VENTRICLE RV Basal diam:  3.80 cm RV S prime:     12.50 cm/s TAPSE (M-mode): 1.8 cm LEFT ATRIUM             Index        RIGHT ATRIUM           Index LA diam:        4.20 cm 2.10 cm/m   RA Area:     11.80 cm LA Vol (A2C):   46.1 ml 23.04 ml/m  RA Volume:   21.70 ml  10.84 ml/m LA Vol (A4C):   59.3 ml 29.63 ml/m LA Biplane Vol: 57.0 ml 28.48 ml/m  AORTIC VALVE LVOT Vmax:   123.00 cm/s LVOT Vmean:  86.200 cm/s LVOT VTI:    0.208 m  AORTA Ao Asc diam: 3.30 cm MITRAL VALVE MV Area (PHT): 5.64 cm     SHUNTS MV E velocity: 51.40 cm/s   Systemic VTI: 0.21 m MV A velocity: 109.00 cm/s MV E/A ratio:  0.47 Mihai Croitoru MD Electronically signed by Thurmon Fair MD Signature Date/Time: 03/01/2023/4:28:00 PM    Final    CARDIAC CATHETERIZATION  Result Date: 03/01/2023   Mid LAD lesion is 30% stenosed.  Diffuse mild to moderate CAD.   LV end diastolic pressure is normal.   There is no aortic valve stenosis.   Tortuous right subclavian requiring 85 cm destination sheath. Mild to  moderate diffuse coronary disease in the major epicardial vessels.  No culprit lesion found.  Patient had  a total of 45 minutes of chest pain.  No return of symptoms.  Possibilities include small vessel disease, vasospasm or myocarditis.  Starting heparin 2 hours after TR band removal.  Discussed with the patient's wife, Lanora Manis.   DG Chest Port 1 View  Result Date: 02/28/2023 CLINICAL DATA:  Chest pain EXAM: PORTABLE CHEST 1 VIEW COMPARISON:  None Available. FINDINGS: The heart size and mediastinal contours are within normal limits. Both lungs are clear. The visualized skeletal structures are unremarkable. IMPRESSION: No active disease. Electronically Signed   By: Helyn Numbers M.D.   On: 02/28/2023 21:50    Labs:  CBC: Recent Labs    02/28/23 2115 02/28/23 2134 03/01/23 0124 03/02/23 1040 03/15/23 1251 03/15/23 1552  WBC 16.5*  --  15.5* 17.1* 19.5*  --   HGB 14.7   < > 13.9 15.5 14.6 11.2*  HCT 44.1   < > 41.7 45.0 45.6 33.0*  PLT 444*  --  426* 388 494*  --    < > = values in this interval not displayed.    COAGS: Recent Labs    03/01/23 0124  INR 1.2    BMP: Recent Labs    03/01/23 0124 03/02/23 1040 03/03/23 0556 03/11/23 1518 03/15/23 1251 03/15/23 1552  NA 138 136 135 138 134* 131*  K 4.1 3.9 4.0 5.7* 4.5 6.5*  CL 104 101 101 99 102  --   CO2 23 24 22 21  19*  --   GLUCOSE 149* 178* 141* 145* 419*  --   BUN 27* 15 19 39* 57*  --   CALCIUM 9.3 9.2 9.0 10.8* 8.8*  --   CREATININE 1.69* 1.15 1.21 1.74* 2.54*  --   GFRNONAA 44* >60 >60  --  27*  --     LIVER FUNCTION TESTS: Recent Labs    03/01/23 0124 03/15/23 1251  BILITOT 1.2 1.7*  AST 22 20  ALT 24 24  ALKPHOS 74 82  PROT 6.5 6.2*  ALBUMIN 3.7 3.4*    TUMOR MARKERS: No results for input(s): "AFPTM", "CEA", "CA199", "CHROMGRNA" in the last 8760 hours.  Assessment and Plan:  Left adrenal mass hemorrhage Scheduled for emergent adrenal/renal arteriogram with possible embolization of left  adrenal mass Risks and benefits of  adrenal/renal arteriogram with possible embolization of left adrenal mass were discussed with the patient's wife at bedside and sister via phone including, but not limited to bleeding, infection, vascular injury or contrast induced renal failure.  This interventional procedure involves the use of X-rays and because of the nature of the planned procedure, it is possible that we will have prolonged use of X-ray fluoroscopy.  Potential radiation risks to you include (but are not limited to) the following: - A slightly elevated risk for cancer  several years later in life. This risk is typically less than 0.5% percent. This risk is low in comparison to the normal incidence of human cancer, which is 33% for women and 50% for men according to the [employer]. - Radiation induced injury can include skin redness, resembling a rash, tissue breakdown / ulcers and hair loss (which can be temporary or permanent).   The likelihood of either of these occurring depends on the difficulty of the procedure and whether you are sensitive to radiation due to previous procedures, disease, or genetic conditions.   IF your procedure requires a prolonged use of radiation, you will be notified and given written instructions for further action.  It is your responsibility to  monitor the irradiated area for the 2 weeks following the procedure and to notify your physician if you are concerned that you have suffered a radiation induced injury.    All of the patient's questions were answered, patient is agreeable to proceed.  Consent signed and in chart.  Thank you for this interesting consult.  I greatly enjoyed meeting Shane Sampson and look forward to participating in their care.  A copy of this report was sent to the requesting provider on this date.  Electronically Signed: Robet Leu, PA-C 03/15/2023, 4:04 PM   I spent a total of 20 Minutes    in face to face in  clinical consultation, greater than 50% of which was counseling/coordinating care for adrenal/renal arteriogram, with possible embolization

## 2023-03-15 NOTE — Consult Note (Addendum)
Consult Note  CELVIN PARA Feb 23, 1956  [id number].    Requesting MD: Dr. Particia Nearing Chief Complaint/Reason for Consult: adrenal mass, hemoperitoneum  HPI:  67 y.o. male with medical history significant for hypertension, hyperlipidemia, adrenal mass, CKD 3A, recent NSTEMI (03/01/2023), Takotsubo cardiomyopathy who presented to [facility] ED with flank pain with associated nausea, vomiting that began approximately 1 hour prior to arrival to the ED via EMS.  ED course significant for CT scan showing hemoperitoneum and he coded while in ED with ROSC after CPR.  Patient now intubated/sedated and history collected from EDP and chart review.  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 Fredricka Bonine tomorrow  Substance use: Occasional alcohol use, denies tobacco use.  Chart review Allergies: NKDA Blood thinners: None Past Surgeries: Hernia repair   ROS: Review of Systems  Unable to perform ROS: Intubated    No family history on file.  Past Medical History:  Diagnosis Date   Hypertension     Past Surgical History:  Procedure Laterality Date   HERNIA REPAIR     LEFT HEART CATH AND CORONARY ANGIOGRAPHY N/A 03/01/2023   Procedure: LEFT HEART CATH AND CORONARY ANGIOGRAPHY;  Surgeon: Corky Crafts, MD;  Location: [facility] INVASIVE CV LAB;  Service: Cardiovascular;  Laterality: N/A;    Social History:  reports that he has never smoked. He has never used smokeless tobacco. He reports current alcohol use of about 1.0 standard drink of alcohol per week. He reports that he does not use drugs.  Allergies: No Known Allergies  (Not in a hospital admission)   Blood pressure 97/79, pulse (!) 125, temperature 97.7 F (36.5 C), temperature source Oral, resp. rate (!) 28, height 6'  (1.829 m), weight 74.4 kg, SpO2 100 %. Physical Exam: General: critically ill appearing white male, intubated, sedated. HEENT: head is normocephalic, atraumatic.  Sclera are noninjected.  Pupils equal and round.  Heart: tachycardia, palpable femoral pulses Lungs: CTAB, ventilated respirations Abd: soft,not tense, no hernias, no palpable masses. Not obviously tender but patient is sedated. MSK: all 4 extremities are symmetrical with no cyanosis, clubbing, or edema. Skin: warm and dry with no masses, lesions, or rashes Neuro: limited by sedation- patient with purposeful movement (reaching for ETT) Psych: unable to assess   Results for orders placed or performed during the hospital encounter of 03/15/23 (from the past 48 hour(s))  CBC with Differential     Status: Abnormal   Collection Time: 03/15/23 12:51 PM  Result Value Ref Range   WBC 19.5 (H) 4.0 - 10.5 K/uL   RBC 4.88 4.22 - 5.81 MIL/uL   Hemoglobin 14.6 13.0 - 17.0 g/dL   HCT 40.9 81.1 - 91.4 %   MCV 93.4 80.0 - 100.0 fL   MCH 29.9 26.0 - 34.0 pg   MCHC 32.0 30.0 - 36.0 g/dL   RDW 78.2 95.6 - 21.3 %   Platelets 494 (H) 150 - 400 K/uL   nRBC 0.0 0.0 - 0.2 %   Neutrophils Relative % 78 %   Neutro Abs 15.4 (H) 1.7 - 7.7 K/uL   Lymphocytes Relative 14 %   Lymphs Abs 2.6 0.7 - 4.0 K/uL   Monocytes Relative 5 %   Monocytes Absolute 0.9 0.1 - 1.0 K/uL   Eosinophils Relative 1 %   Eosinophils  Absolute 0.3 0.0 - 0.5 K/uL   Basophils Relative 1 %   Basophils Absolute 0.2 (H) 0.0 - 0.1 K/uL   Immature Granulocytes 1 %   Abs Immature Granulocytes 0.15 (H) 0.00 - 0.07 K/uL    Comment: Performed at [facility], [address] [place], [place] [address]  Comprehensive metabolic panel     Status: Abnormal   Collection Time: 03/15/23 12:51 PM  Result Value Ref Range   Sodium 134 (L) 135 - 145 mmol/L   Potassium 4.5 3.5 - 5.1 mmol/L   Chloride 102 98 - 111 mmol/L   CO2 19 (L) 22 - 32 mmol/L   Glucose, Bld 419 (H) 70 - 99  mg/dL    Comment: Glucose reference range applies only to samples taken after fasting for at least 8 hours.   BUN 57 (H) 8 - 23 mg/dL   Creatinine, Ser 4.01 (H) 0.61 - 1.24 mg/dL   Calcium 8.8 (L) 8.9 - 10.3 mg/dL   Total Protein 6.2 (L) 6.5 - 8.1 g/dL   Albumin 3.4 (L) 3.5 - 5.0 g/dL   AST 20 15 - 41 U/L   ALT 24 0 - 44 U/L   Alkaline Phosphatase 82 38 - 126 U/L   Total Bilirubin 1.7 (H) 0.3 - 1.2 mg/dL   GFR, Estimated 27 (L) >60 mL/min    Comment: (NOTE) Calculated using the CKD-EPI Creatinine Equation (2021)    Anion gap 13 5 - 15    Comment: Performed at [facility], [address] [place], [place] [address]  Lipase, blood     Status: None   Collection Time: 03/15/23 12:51 PM  Result Value Ref Range   Lipase 48 11 - 51 U/L    Comment: Performed at [facility], [address], [place], [place] [address]  Troponin I (High Sensitivity)     Status: Abnormal   Collection Time: 03/15/23 12:51 PM  Result Value Ref Range   Troponin I (High Sensitivity) 84 (H) <18 ng/L    Comment: (NOTE) Elevated high sensitivity troponin I (hsTnI) values and significant  changes across serial measurements may suggest ACS but many other  chronic and acute conditions are known to elevate hsTnI results.  Refer to the "Links" section for chest pain algorithms and additional  guidance. Performed at [facility], [address] [place], [place] [address]   CBG monitoring, ED     Status: Abnormal   Collection Time: 03/15/23  2:07 PM  Result Value Ref Range   Glucose-Capillary 292 (H) 70 - 99 mg/dL    Comment: Glucose reference range applies only to samples taken after fasting for at least 8 hours.   CT Angio Chest/Abd/Pel for Dissection W and/or Wo Contrast  Result Date: 03/15/2023 CLINICAL DATA:  Upper left abdominal pain nausea and vomiting. Acute aortic syndrome suspected EXAM: CT ANGIOGRAPHY CHEST, ABDOMEN AND PELVIS TECHNIQUE: Non-contrast CT of the chest was initially  obtained. Multidetector CT imaging through the chest, abdomen and pelvis was performed using the standard protocol during bolus administration of intravenous contrast. Multiplanar reconstructed images and MIPs were obtained and reviewed to evaluate the vascular anatomy. RADIATION DOSE REDUCTION: This exam was performed according to the departmental dose-optimization program which includes automated exposure control, adjustment of the mA and/or kV according to patient size and/or use of iterative reconstruction technique. CONTRAST:  75mL OMNIPAQUE IOHEXOL 350 MG/ML SOLN COMPARISON:  Chest radiograph 03/15/2023; MRI abdomen 03/03/2023; CTA chest 03/01/2023 FINDINGS: CTA  CHEST FINDINGS Cardiovascular: Preferential opacification of the thoracic aorta. No evidence of thoracic aortic aneurysm or dissection. Normal heart size. No pericardial effusion. Mediastinum/Nodes: Endotracheal tube tip in the intrathoracic trachea. Unremarkable esophagus. No lymphadenopathy. Lungs/Pleura: No pneumothorax. Trace left pleural effusion. Bibasilar atelectasis/scarring. Trachea and central airways are patent. Musculoskeletal: Minimal and nondisplaced fractures of the left anterior 2nd-7th ribs and right anterior 2nd-6th ribs. Review of the MIP images confirms the above findings. CTA ABDOMEN AND PELVIS FINDINGS VASCULAR Aorta: Normal in caliber without dissection, penetrating atherosclerotic ulcer, intramural hematoma. Moderate atherosclerotic plaque without hemodynamically significant stenosis. Celiac: Normal variant with the common hepatic artery arising directly from the aorta. The splenic artery is moderately secondary to mass effect from the hematoma (circa series 7/image 146). SMA: Patent. Renals: Patent without aneurysm or dissection. Calcified plaque causes mild narrowing at the origins of both renal arteries. IMA: Patent. Inflow: Scattered atherosclerotic plaque causes up to mild narrowing. No aneurysm or dissection. Veins: No  obvious venous abnormality within the limitations of this arterial phase study. Review of the MIP images confirms the above findings. NON-VASCULAR Hepatobiliary: Cholelithiasis. No evidence of cholecystitis. Unremarkable liver. Pancreas: Unremarkable. Spleen: Unremarkable. Adrenals/Urinary Tract: Heterogenous fluid compatible with blood products within the upper left retroperitoneal space extending inferiorly within the pararenal space appears to originate from the left adrenal mass where there is active hemorrhage. The hemorrhage measures approximately 15.0 x 12.6 x 15.9 cm (AP x ML x CC). The borders of the left adrenal mass or obscured by the adjacent hemorrhage however this measures approximately 6.8 x 7.0 cm in the axial plane (7/164). This measured 6.0 by 5.3 cm on MRI 03/03/2023. Unremarkable right adrenal gland. Hemorrhage surrounds the left kidney without mass effect. Bilateral renal cysts which were previously evaluated with MRI 03/03/2023. No follow-up is recommended. No urinary calculi or hydronephrosis. Unremarkable bladder. Stomach/Bowel: Normal caliber large and small bowel. Colonic diverticulosis without diverticulitis. Normal appendix. Stomach is within normal limits. Lymphatic: No lymphadenopathy. Reproductive: Unremarkable. Other: No free intraperitoneal air. Musculoskeletal: Acute fracture or destructive osseous lesion sclerotic lesions in the bilateral iliac wings are favored to represent bone islands. Review of the MIP images confirms the above findings. IMPRESSION: 1. 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 2. Nondisplaced fractures of the left anterior 2nd-7th ribs and right anterior 2nd-6th ribs. No pneumothorax. Trace left pleural effusion. 3. Increased size of the left adrenal mass, previously evaluated with MRI 03/03/2023 Aortic Atherosclerosis (ICD10-I70.0). Critical Value/emergent results were called by telephone at the time of interpretation on 03/15/2023 at 3:16 pm to  provider JULIE HAVILAND , who verbally acknowledged these results. Electronically Signed   By: Minerva Fester M.D.   On: 03/15/2023 15:24   DG Chest Portable 1 View  Result Date: 03/15/2023 CLINICAL DATA:  intubation confrimation EXAM: PORTABLE CHEST 1 VIEW COMPARISON:  CXR 02/28/23 FINDINGS: Endotracheal tube terminates at the level of the thoracic inlet approximately 4 cm above the carina. Low lung volumes. No pleural effusion. No pneumothorax no focal airspace opacity. Likely unchanged cardiac and mediastinal contours when accounting for differences in lung volumes. Visualized upper abdomen is unremarkable. IMPRESSION: Endotracheal tube terminates at the level of the thoracic inlet. Low lung volumes. Electronically Signed   By: Lorenza Cambridge M.D.   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 At this time recommend Emergent consultation to IR for attempted  hemorrhage control. If this is unsuccessful general surgery will be available to re-assess and determine if surgical intervention is warranted. Critical care has been engaged. During my exam the patient was tachycardic and hypotensive on levophed. I asked the nurse to hang 2 units of pRBCs emergently. I called the ICU physician Dr. Craige Cotta who agreed with this plan of care and stated that someone from his team would be down shortly to gain central access. When I left the patients room systolic pressure was 90-100, up from 50-60's, with blood hanging.  Patient admitted to CCM. General surgery will continue to follow  FEN: NPO/NGT ID: none currently VTE: hold due to hemorrhagic shock   Per primary: Bilateral rib fractures s/p CPR H/o recent NSTEMI (03/01/2023) HFmrEF/Takotsubo cardiomyopathy - last echo 5/21 EF 45 to 50%  HTN HLD BPH CKD3a  I reviewed nursing notes,  Consultant IR notes, hospitalist notes, last 24 h vitals and pain scores, last 48 h intake and output, last 24 h labs and trends, and last 24 h imaging results.   Hosie Spangle, [facility] Surgery 03/15/2023, 3:27 PM Please see Amion for pager number during day hours 7:00am-4:30pm

## 2023-03-15 NOTE — Transfer of Care (Signed)
Immediate Anesthesia Transfer of Care Note  Patient: Shane Sampson  Procedure(s) Performed: IR WITH ANESTHESIA  Patient Location: ICU  Anesthesia Type:General  Level of Consciousness: sedated and Patient remains intubated per anesthesia plan  Airway & Oxygen Therapy: Patient remains intubated per anesthesia plan and Patient placed on Ventilator (see vital sign flow sheet for setting)  Post-op Assessment: Report given to RN and Post -op Vital signs reviewed and stable  Post vital signs: Reviewed and unstable  Last Vitals:  Vitals Value Taken Time  BP 106/89 03/15/23 1917  Temp    Pulse 84 03/15/23 1920  Resp 20 03/15/23 1920  SpO2 100 % 03/15/23 1920  Vitals shown include unvalidated device data.  Last Pain:  Vitals:   03/15/23 1245  TempSrc: Oral  PainSc:          Complications: No notable events documented.

## 2023-03-15 NOTE — Telephone Encounter (Signed)
Patient informed to stop Entresto per Gavin Pound NP. He will continue to monitor blood pressure and heart rate. Appointment scheduled with Gavin Pound NP for June 17th at 850. Patient is aware of appointment and he verbalized understanding.

## 2023-03-15 NOTE — Consult Note (Signed)
Cardiology Consultation   Patient ID: TIN STANKUS MRN: [id number]; DOB: 11/07/1955  Admit date: 03/15/2023 Date of Consult: 03/15/2023  PCP:  Rodrigo Ran, MD   [facility] Providers Cardiologist:  Little Ishikawa, MD        Patient Profile:   Shane Sampson is a 67 y.o. 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  History of Present Illness:   History of obtained from chart given patient is intubated and sedated.   "Per wife, patient has felt sick the last 2 days with really low bps (<90/60s) and HR in 120-130s. K+ 5.7 on 5/31 and instructed by cardiology to adjust GDMT. This morning, started to feel very nauseous with "stomach cramping." Wife stepped out to grab lunch when severe LUQ and flank pain started and EMS was called.   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  "On arrival to ED, RR 25, HR 130, and BP86/69. EKG unremarkable. Initial evaluation pertinent for trop 84, WBC 19.5, glucose 419, Na 134, Cr 2.54, and BUN 57. Pt acutely decompensated with brief episode of PEA resulting in CPR and intubation. STAT CTA of the C/A/P showed active hemorrhage 15 x 12.6 x 15.9 cm in upper left retroperitoneal space arising from left adrenal mass. He subsequently underwent IR embolization with acute success.  Since his procedure, he has however had instances of labile blood pressures on his arterial line with swings in his systolics between the 180s and 50s requiring seesaw treatment with both pressors and antihypertensive agents.    Past Medical History:  Diagnosis Date    Hypertension     Past Surgical History:  Procedure Laterality Date   HERNIA REPAIR     IR ANGIOGRAM VISCERAL SELECTIVE  03/15/2023   IR EMBO TUMOR ORGAN ISCHEMIA INFARCT INC GUIDE ROADMAPPING  03/15/2023   IR US GUIDE VASC ACCESS RIGHT  03/15/2023   LEFT HEART CATH AND CORONARY ANGIOGRAPHY N/A 03/01/2023   Procedure: LEFT HEART CATH AND CORONARY ANGIOGRAPHY;  Surgeon: Corky Crafts, MD;  Location: [facility];  Service: Cardiovascular;  Laterality: N/A;     Home Medications:  Prior to Admission medications   Medication Sig Start Date End Date Taking? Authorizing Provider  aspirin 81 MG tablet Take 81 mg by mouth daily.    [provider]  atorvastatin (LIPITOR) 80 MG tablet Take 1 tablet (80 mg total) by mouth daily. 03/05/23   Arty Baumgartner, NP  carvedilol (COREG) 6.25 MG tablet Take 1 tablet (6.25 mg total) by mouth 2 (two) times daily. 03/11/23   Carlos Levering, NP  empagliflozin (JARDIANCE) 10 MG TABS tablet Take 1 tablet (10 mg total) by mouth daily. 03/05/23   Arty Baumgartner, NP  ezetimibe (ZETIA) 10 MG tablet Take 10 mg by mouth daily.    [provider]  nitroGLYCERIN (NITROSTAT) 0.4 MG SL tablet Place 1 tablet (0.4 mg total) under the tongue every 5 (five) minutes x 3 doses as needed for chest pain. 03/04/23   Arty Baumgartner, NP  sacubitril-valsartan (ENTRESTO) 24-26 MG Take 1 tablet by mouth 2 (two) times daily. 03/04/23  Arty Baumgartner, NP  spironolactone (ALDACTONE) 25 MG tablet Take 1/2 tablet (12.5 mg total) by mouth daily. 03/05/23   Arty Baumgartner, NP  tamsulosin (FLOMAX) 0.4 MG CAPS capsule Take 0.4 mg by mouth daily.    [provider]    Inpatient Medications: Scheduled Meds:  hydrocortisone sod succinate (SOLU-CORTEF) inj  100 mg Intravenous Q8H   insulin aspart  0-15 Units Subcutaneous Q4H   mouth rinse  15 mL Mouth Rinse Q2H   pantoprazole (PROTONIX) IV  40 mg Intravenous Daily   Continuous Infusions:   sodium chloride     EPINEPHrine (ADRENALIN) 10 mg in sodium chloride 0.9 % 250 mL (0.04 mg/mL) infusion Stopped (03/15/23 1712)   fentaNYL infusion INTRAVENOUS 200 mcg/hr (03/15/23 2201)   norepinephrine (LEVOPHED) Adult infusion Stopped (03/15/23 2130)   propofol (DIPRIVAN) infusion Stopped (03/15/23 2130)   sodium bicarbonate 150 mEq in dextrose 5 % 1,150 mL infusion 125 mL/hr at 03/15/23 2200   vasopressin Stopped (03/15/23 2103)   PRN Meds: fentaNYL (SUBLIMAZE) injection, iodixanol, midazolam, mouth rinse  Allergies:   No Known Allergies  Social History:   Social History   Socioeconomic History   Marital status: Married    Spouse name: Not on file   Number of children: Not on file   Years of education: Not on file   Highest education level: Not on file  Occupational History   Not on file  Tobacco Use   Smoking status: Never   Smokeless tobacco: Never  Substance and Sexual Activity   Alcohol use: Yes    Alcohol/week: 1.0 standard drink of alcohol    Types: 1 Standard drinks or equivalent per week   Drug use: No   Sexual activity: Not on file  Other Topics Concern   Not on file  Social History Narrative   Not on file   Social Determinants of Health   Financial Resource Strain: Not on file  Food Insecurity: No Food Insecurity (03/01/2023)   Hunger Vital Sign    Worried About Running Out of Food in the Last Year: Never true    Ran Out of Food in the Last Year: Never true  Transportation Needs: No Transportation Needs (03/01/2023)   PRAPARE - Administrator, Civil Service (Medical): No    Lack of Transportation (Non-Medical): No  Physical Activity: Not on file  Stress: Not on file  Social Connections: Not on file  Intimate Partner Violence: Not At Risk (03/01/2023)   Humiliation, Afraid, Rape, and Kick questionnaire    Fear of Current or Ex-Partner: No    Emotionally Abused: No    Physically Abused: No    Sexually Abused: No    Family History:   No  family history on file.   ROS:  Please see the history of present illness.   All other ROS reviewed and negative.     Physical Exam/Data:   Vitals:   03/15/23 2215 03/15/23 2230 03/15/23 2300 03/15/23 2305  BP:      Pulse: (!) 131 (!) 131 (!) 130 (!) 130  Resp: 18 19 20 20   Temp: 98.6 F (37 C) 98.8 F (37.1 C) 99.1 F (37.3 C) 99.1 F (37.3 C)  TempSrc: Bladder Bladder Bladder Bladder  SpO2: 97% 97% 97% 97%  Weight:      Height:        Intake/Output Summary (Last 24 hours) at 03/15/2023 2326 Last data filed at 03/15/2023 2300 Gross per 24 hour  Intake 3631.4 ml  Output --  Net 3631.4 ml      03/15/2023   12:43 PM 03/11/2023    2:23 PM 03/04/2023    4:07 AM  Last 3 Weights  Weight (lbs) 164 lb 166 lb 12.8 oz 164 lb  Weight (kg) 74.39 kg 75.66 kg 74.39 kg     Body mass index is 22.24 kg/m.  General:  Well nourished, well developed, intubated and sedated Cardiac:  normal S1, S2; tachycardic; no murmur/rubs/gallops Lungs:  clear to auscultation anteriorly in the bilateral lung fields Abd: soft Ext: no edema, warm and well perfused Musculoskeletal:  No deformities Skin: warm and dry  Neuro:  sedated   Relevant CV Studies:   Laboratory Data:  High Sensitivity Troponin:   Recent Labs  Lab 02/28/23 2115 02/28/23 2335 03/15/23 1251 03/15/23 1646 03/15/23 2030  TROPONINIHS 294* 1,420* 84* 96* 126*     Chemistry Recent Labs  Lab 03/11/23 1518 03/11/23 1518 03/15/23 1251 03/15/23 1552 03/15/23 1942 03/15/23 2030  NA 138   < > 134* 131* 141 139  K 5.7*  --  4.5 6.5* 5.3* 4.6  CL 99  --  102  --   --  107  CO2 21  --  19*  --   --  22  GLUCOSE 145*  --  419*  --   --  166*  BUN 39*  --  57*  --   --  54*  CREATININE 1.74*  --  2.54*  --   --  2.43*  CALCIUM 10.8*  --  8.8*  --   --  7.2*  GFRNONAA  --   --  27*  --   --  28*  ANIONGAP  --   --  13  --   --  10   < > = values in this interval not displayed.    Recent Labs  Lab 03/15/23 1251  03/15/23 2030  PROT 6.2* 4.2*  ALBUMIN 3.4* 2.5*  AST 20 15  ALT 24 21  ALKPHOS 82 54  BILITOT 1.7* 2.0*   Lipids No results for input(s): "CHOL", "TRIG", "HDL", "LABVLDL", "LDLCALC", "CHOLHDL" in the last 168 hours.  Hematology Recent Labs  Lab 03/15/23 1251 03/15/23 1552 03/15/23 1942 03/15/23 2030 03/15/23 2120  WBC 19.5*  --   --  PENDING 57.3*  RBC 4.88  --   --  4.32 4.39  HGB 14.6   < > 12.2* 12.9* 13.6  HCT 45.6   < > 36.0* 37.7* 38.7*  MCV 93.4  --   --  87.3 88.2  MCH 29.9  --   --  29.9 31.0  MCHC 32.0  --   --  34.2 35.1  RDW 13.4  --   --  13.9 14.1  PLT 494*  --   --  272 282   < > = values in this interval not displayed.   Thyroid No results for input(s): "TSH", "FREET4" in the last 168 hours.  BNPNo results for input(s): "BNP", "PROBNP" in the last 168 hours.  DDimer No results for input(s): "DDIMER" in the last 168 hours.   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 The patient presents for emergent arteriography and embolization. EXAM: 1. ULTRASOUND GUIDANCE FOR VASCULAR ACCESS OF THE RIGHT COMMON FEMORAL ARTERY 2. SELECTIVE ARTERIOGRAPHY OF THE LEFT ADRENAL ARTERY (FIRST ORDER OFF OF ABDOMINAL AORTA) 3.  PARTICLE EMBOLIZATION OF THE LEFT ADRENAL ARTERY FOR TUMOR ISCHEMIA MEDICATIONS: No additional medications. ANESTHESIA/SEDATION: The patient was intubated during the procedure and monitored by the Department of Anesthesia. CONTRAST:  50 mL Visipaque 320 FLUOROSCOPY TIME:  Radiation Exposure Index (as provided by the fluoroscopic device): 458 mGy Kerma COMPLICATIONS: None immediate. PROCEDURE: Informed consent was obtained from the patient's wife following explanation of the procedure, risks, benefits and alternatives. The patient's wife understands, agrees and consents for the procedure. All questions  were addressed. A time out was performed prior to the initiation of the procedure. Maximal barrier sterile technique utilized including caps, mask, sterile gowns, sterile gloves, large sterile drape, hand hygiene, and chlorhexidine prep. Ultrasound was performed to confirm patency of the right common femoral artery. An ultrasound image was saved and recorded. Under ultrasound guidance, access of the artery was accomplished with a micropuncture set. A 5 French vascular sheath was then advanced into the artery over a guidewire. A 5 French Cobra catheter was advanced into the abdominal aorta. This was then used to selectively catheterize the origin of the left adrenal artery. Selective arteriography was performed. A microcatheter was then advanced through the 5 French catheter into the proximal aspect of the left adrenal artery. Additional selective arteriography was performed. The catheter was then further advanced into the left adrenal artery and further arteriography performed. Particle embolization was performed in the left renal artery with 300-500 micron sized Embosphere particles. Particles were slowly infused by hand injection in diluted contrast. Ultimately, 3/4 vial of particles were instilled. Additional arteriography was performed through the microcatheter. Different micro catheters were utilized in attempting further catheter advancement over guidewires to allow placement of embolization coils into the left adrenal artery. Ultimately microcatheter and 5 French catheter were removed. Hemostasis was obtained at the femoral access site utilizing the Angio-Seal device. FINDINGS: Selective left adrenal arteriography demonstrates abnormal vascularity supplying the known mass of the left adrenal gland with a rounded region of hypervascular enhancement noted. With arteriography, there was no active extravasation of contrast noted or discrete pseudoaneurysms. The left adrenal artery was quite tortuous after takeoff  and it was difficult advancing a microcatheter much beyond its initial superiorly oriented segment. Particle embolization was successful in occluding smaller branches supplying tumor and slowing flow significantly in the main branches of the left adrenal artery. Microcatheter purchase could not be accomplished beyond the proximal segment to allow safe placement of embolization coils with inability to advance a catheter over a guidewire. Catheter arteriography demonstrates dissection of the proximal vessel likely from catheter and guidewire manipulation which was the likely cause of inability to advance the microcatheter. For this reason, proximal coil embolization of the left renal artery was not performed. This does allow for potential future repeat embolization of the left adrenal artery once the dissection heals, should the patient require future embolization such as preoperative embolization. IMPRESSION: Technically successful microsphere particle embolization of left adrenal arterial supply to a bleeding left adrenal mass. Particle embolization was performed with 3/4 of a vial of 300-500 micron sized Embosphere particles. Proximal coil embolization could not be performed due to inability to advance the microcatheter sufficiently. Arteriography did demonstrate proximal dissection of the left adrenal artery during the procedure which was likely the cause of inability to advance the catheter. Lack of use of embolization coils does allow for potential future embolization of the left adrenal artery once the dissection heals. Electronically Signed   By: Irish Lack M.D.   On: 03/15/2023 19:10   IR  Angiogram Visceral Selective  Result Date: 03/15/2023 INDICATION: Acute massive hemorrhage from a known large left adrenal mass with cardiopulmonary arrest. The patient presents for emergent arteriography and embolization. EXAM: 1. ULTRASOUND GUIDANCE FOR VASCULAR ACCESS OF THE RIGHT COMMON FEMORAL ARTERY 2.  SELECTIVE ARTERIOGRAPHY OF THE LEFT ADRENAL ARTERY (FIRST ORDER OFF OF ABDOMINAL AORTA) 3. PARTICLE EMBOLIZATION OF THE LEFT ADRENAL ARTERY FOR TUMOR ISCHEMIA MEDICATIONS: No additional medications. ANESTHESIA/SEDATION: The patient was intubated during the procedure and monitored by the Department of Anesthesia. CONTRAST:  50 mL Visipaque 320 FLUOROSCOPY TIME:  Radiation Exposure Index (as provided by the fluoroscopic device): 458 mGy Kerma COMPLICATIONS: None immediate. PROCEDURE: Informed consent was obtained from the patient's wife following explanation of the procedure, risks, benefits and alternatives. The patient's wife understands, agrees and consents for the procedure. All questions were addressed. A time out was performed prior to the initiation of the procedure. Maximal barrier sterile technique utilized including caps, mask, sterile gowns, sterile gloves, large sterile drape, hand hygiene, and chlorhexidine prep. Ultrasound was performed to confirm patency of the right common femoral artery. An ultrasound image was saved and recorded. Under ultrasound guidance, access of the artery was accomplished with a micropuncture set. A 5 French vascular sheath was then advanced into the artery over a guidewire. A 5 French Cobra catheter was advanced into the abdominal aorta. This was then used to selectively catheterize the origin of the left adrenal artery. Selective arteriography was performed. A microcatheter was then advanced through the 5 French catheter into the proximal aspect of the left adrenal artery. Additional selective arteriography was performed. The catheter was then further advanced into the left adrenal artery and further arteriography performed. Particle embolization was performed in the left renal artery with 300-500 micron sized Embosphere particles. Particles were slowly infused by hand injection in diluted contrast. Ultimately, 3/4 vial of particles were instilled. Additional arteriography was  performed through the microcatheter. Different micro catheters were utilized in attempting further catheter advancement over guidewires to allow placement of embolization coils into the left adrenal artery. Ultimately microcatheter and 5 French catheter were removed. Hemostasis was obtained at the femoral access site utilizing the Angio-Seal device. FINDINGS: Selective left adrenal arteriography demonstrates abnormal vascularity supplying the known mass of the left adrenal gland with a rounded region of hypervascular enhancement noted. With arteriography, there was no active extravasation of contrast noted or discrete pseudoaneurysms. The left adrenal artery was quite tortuous after takeoff and it was difficult advancing a microcatheter much beyond its initial superiorly oriented segment. Particle embolization was successful in occluding smaller branches supplying tumor and slowing flow significantly in the main branches of the left adrenal artery. Microcatheter purchase could not be accomplished beyond the proximal segment to allow safe placement of embolization coils with inability to advance a catheter over a guidewire. Catheter arteriography demonstrates dissection of the proximal vessel likely from catheter and guidewire manipulation which was the likely cause of inability to advance the microcatheter. For this reason, proximal coil embolization of the left renal artery was not performed. This does allow for potential future repeat embolization of the left adrenal artery once the dissection heals, should the patient require future embolization such as preoperative embolization. IMPRESSION: Technically successful microsphere particle embolization of left adrenal arterial supply to a bleeding left adrenal mass. Particle embolization was performed with 3/4 of a vial of 300-500 micron sized Embosphere particles. Proximal coil embolization could not be performed due to inability to advance the microcatheter  sufficiently. Arteriography did demonstrate proximal  dissection of the left adrenal artery during the procedure which was likely the cause of inability to advance the catheter. Lack of use of embolization coils does allow for potential future embolization of the left adrenal artery once the dissection heals. Electronically Signed   By: Irish Lack M.D.   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 The patient presents for emergent arteriography and embolization. EXAM: 1. ULTRASOUND GUIDANCE FOR VASCULAR ACCESS OF THE RIGHT COMMON FEMORAL ARTERY 2. SELECTIVE ARTERIOGRAPHY OF THE LEFT ADRENAL ARTERY (FIRST ORDER OFF OF ABDOMINAL AORTA) 3. PARTICLE EMBOLIZATION OF THE LEFT ADRENAL ARTERY FOR TUMOR ISCHEMIA MEDICATIONS: No additional medications. ANESTHESIA/SEDATION: The patient was intubated during the procedure and monitored by the Department of Anesthesia. CONTRAST:  50 mL Visipaque 320 FLUOROSCOPY TIME:  Radiation Exposure Index (as provided by the fluoroscopic device): 458 mGy Kerma COMPLICATIONS: None immediate. PROCEDURE: Informed consent was obtained from the patient's wife following explanation of the procedure, risks, benefits and alternatives. The patient's wife understands, agrees and consents for the procedure. All questions were addressed. A time out was performed prior to the initiation of the procedure. Maximal barrier sterile technique utilized including caps, mask, sterile gowns, sterile gloves, large sterile drape, hand hygiene, and chlorhexidine prep. Ultrasound was performed to confirm patency of the right common femoral artery. An ultrasound image was saved and recorded. Under ultrasound guidance, access of the artery was accomplished with a micropuncture set. A 5 French vascular sheath was then advanced into the artery over a guidewire. A 5 French Cobra  catheter was advanced into the abdominal aorta. This was then used to selectively catheterize the origin of the left adrenal artery. Selective arteriography was performed. A microcatheter was then advanced through the 5 French catheter into the proximal aspect of the left adrenal artery. Additional selective arteriography was performed. The catheter was then further advanced into the left adrenal artery and further arteriography performed. Particle embolization was performed in the left renal artery with 300-500 micron sized Embosphere particles. Particles were slowly infused by hand injection in diluted contrast. Ultimately, 3/4 vial of particles were instilled. Additional arteriography was performed through the microcatheter. Different micro catheters were utilized in attempting further catheter advancement over guidewires to allow placement of embolization coils into the left adrenal artery. Ultimately microcatheter and 5 French catheter were removed. Hemostasis was obtained at the femoral access site utilizing the Angio-Seal device. FINDINGS: Selective left adrenal arteriography demonstrates abnormal vascularity supplying the known mass of the left adrenal gland with a rounded region of hypervascular enhancement noted. With arteriography, there was no active extravasation of contrast noted or discrete pseudoaneurysms. The left adrenal artery was quite tortuous after takeoff and it was difficult advancing a microcatheter much beyond its initial superiorly oriented segment. Particle embolization was successful in occluding smaller branches supplying tumor and slowing flow significantly in the main branches of the left adrenal artery. Microcatheter purchase could not be accomplished beyond the proximal segment to allow safe placement of embolization coils with inability to advance a catheter over a guidewire. Catheter arteriography demonstrates dissection of the proximal vessel likely from catheter and guidewire  manipulation which was the likely cause of inability to advance the microcatheter. For this reason, proximal coil embolization of the left renal artery was not performed. This does allow for potential future repeat embolization of the left adrenal artery once the dissection heals, should the  patient require future embolization such as preoperative embolization. IMPRESSION: Technically successful microsphere particle embolization of left adrenal arterial supply to a bleeding left adrenal mass. Particle embolization was performed with 3/4 of a vial of 300-500 micron sized Embosphere particles. Proximal coil embolization could not be performed due to inability to advance the microcatheter sufficiently. Arteriography did demonstrate proximal dissection of the left adrenal artery during the procedure which was likely the cause of inability to advance the catheter. Lack of use of embolization coils does allow for potential future embolization of the left adrenal artery once the dissection heals. Electronically Signed   By: Irish Lack M.D.   On: 03/15/2023 19:10   DG Chest Portable 1 View  Result Date: 03/15/2023 CLINICAL DATA:  Central line placement EXAM: PORTABLE CHEST 1 VIEW COMPARISON:  Radiographs 03/15/2023 at 2:20 p.m. FINDINGS: Left subclavian central line tip in the mid SVC. Additional catheter tubing projects over the left axilla. This may represent a PICC line with tip near the left subclavian vein. Remainder unchanged from earlier today. Endotracheal tube tip in the intrathoracic trachea proximally 3.1 cm from the carina. Defibrillator pads. Stable cardiomediastinal silhouette. Aortic atherosclerotic calcification. Low lung volumes accentuate pulmonary vascularity. Bibasilar atelectasis/scarring. No definite pleural effusion. No pneumothorax. Known rib fractures are better assessed on CT. IMPRESSION: Left subclavian central line tip in the mid SVC. Additional catheter tubing projects over the left  axilla. This may represent a PICC line with tip near the left subclavian vein. Remainder unchanged from earlier today. Electronically Signed   By: Minerva Fester M.D.   On: 03/15/2023 17:46   DG Abd Portable 1 View  Result Date: 03/15/2023 CLINICAL DATA:  NG tube placement EXAM: PORTABLE ABDOMEN - 1 VIEW COMPARISON:  Chest radiographs 03/15/2023 FINDINGS: NG tube tip is presumably located within the lower esophagus is only partly visualized on this exam. Recommend advancement 7-8 cm. Remainder unchanged from CT earlier today. IMPRESSION: NG tube tip is presumably located within the lower esophagus however is only partially visualized. Recommend advancement prior to use. These results were called by telephone at the time of interpretation on 03/15/2023 at 3:45 pm to provider [facility] , who verbally acknowledged these results. Electronically Signed   By: Minerva Fester M.D.   On: 03/15/2023 15:52   CT Angio Chest/Abd/Pel for Dissection W and/or Wo Contrast  Result Date: 03/15/2023 CLINICAL DATA:  Upper left abdominal pain nausea and vomiting. Acute aortic syndrome suspected EXAM: CT ANGIOGRAPHY CHEST, ABDOMEN AND PELVIS TECHNIQUE: Non-contrast CT of the chest was initially obtained. Multidetector CT imaging through the chest, abdomen and pelvis was performed using the standard protocol during bolus administration of intravenous contrast. Multiplanar reconstructed images and MIPs were obtained and reviewed to evaluate the vascular anatomy. RADIATION DOSE REDUCTION: This exam was performed according to the departmental dose-optimization program which includes automated exposure control, adjustment of the mA and/or kV according to patient size and/or use of iterative reconstruction technique. CONTRAST:  75mL OMNIPAQUE IOHEXOL 350 MG/ML SOLN COMPARISON:  Chest radiograph 03/15/2023; MRI abdomen 03/03/2023; CTA chest 03/01/2023 FINDINGS: CTA CHEST FINDINGS Cardiovascular: Preferential opacification of the  thoracic aorta. No evidence of thoracic aortic aneurysm or dissection. Normal heart size. No pericardial effusion. Mediastinum/Nodes: Endotracheal tube tip in the intrathoracic trachea. Unremarkable esophagus. No lymphadenopathy. Lungs/Pleura: No pneumothorax. Trace left pleural effusion. Bibasilar atelectasis/scarring. Trachea and central airways are patent. Musculoskeletal: Minimal and nondisplaced fractures of the left anterior 2nd-7th ribs and right anterior 2nd-6th ribs. Review of the MIP images confirms the above  findings. CTA ABDOMEN AND PELVIS FINDINGS VASCULAR Aorta: Normal in caliber without dissection, penetrating atherosclerotic ulcer, intramural hematoma. Moderate atherosclerotic plaque without hemodynamically significant stenosis. Celiac: Normal variant with the common hepatic artery arising directly from the aorta. The splenic artery is moderately secondary to mass effect from the hematoma (circa series 7/image 146). SMA: Patent. Renals: Patent without aneurysm or dissection. Calcified plaque causes mild narrowing at the origins of both renal arteries. IMA: Patent. Inflow: Scattered atherosclerotic plaque causes up to mild narrowing. No aneurysm or dissection. Veins: No obvious venous abnormality within the limitations of this arterial phase study. Review of the MIP images confirms the above findings. NON-VASCULAR Hepatobiliary: Cholelithiasis. No evidence of cholecystitis. Unremarkable liver. Pancreas: Unremarkable. Spleen: Unremarkable. Adrenals/Urinary Tract: Heterogenous fluid compatible with blood products within the upper left retroperitoneal space extending inferiorly within the pararenal space appears to originate from the left adrenal mass where there is active hemorrhage. The hemorrhage measures approximately 15.0 x 12.6 x 15.9 cm (AP x ML x CC). The borders of the left adrenal mass or obscured by the adjacent hemorrhage however this measures approximately 6.8 x 7.0 cm in the axial plane  (7/164). This measured 6.0 by 5.3 cm on MRI 03/03/2023. Unremarkable right adrenal gland. Hemorrhage surrounds the left kidney without mass effect. Bilateral renal cysts which were previously evaluated with MRI 03/03/2023. No follow-up is recommended. No urinary calculi or hydronephrosis. Unremarkable bladder. Stomach/Bowel: Normal caliber large and small bowel. Colonic diverticulosis without diverticulitis. Normal appendix. Stomach is within normal limits. Lymphatic: No lymphadenopathy. Reproductive: Unremarkable. Other: No free intraperitoneal air. Musculoskeletal: Acute fracture or destructive osseous lesion sclerotic lesions in the bilateral iliac wings are favored to represent bone islands. Review of the MIP images confirms the above findings. IMPRESSION: 1. 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 2. Nondisplaced fractures of the left anterior 2nd-7th ribs and right anterior 2nd-6th ribs. No pneumothorax. Trace left pleural effusion. 3. Increased size of the left adrenal mass, previously evaluated with MRI 03/03/2023 Aortic Atherosclerosis (ICD10-I70.0). Critical Value/emergent results were called by telephone at the time of interpretation on 03/15/2023 at 3:16 pm to provider JULIE HAVILAND , who verbally acknowledged these results. Electronically Signed   By: Minerva Fester M.D.   On: 03/15/2023 15:24   DG Chest Portable 1 View  Result Date: 03/15/2023 CLINICAL DATA:  intubation confrimation EXAM: PORTABLE CHEST 1 VIEW COMPARISON:  CXR 02/28/23 FINDINGS: Endotracheal tube terminates at the level of the thoracic inlet approximately 4 cm above the carina. Low lung volumes. No pleural effusion. No pneumothorax no focal airspace opacity. Likely unchanged cardiac and mediastinal contours when accounting for differences in lung volumes. Visualized upper abdomen is unremarkable. IMPRESSION: Endotracheal tube terminates at the level of the thoracic inlet. Low lung volumes. Electronically Signed   By:  Lorenza Cambridge M.D.   On: 03/15/2023 14:37     Assessment and Plan:   WEIKKO LANTZY is a 67 y.o. 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

## 2023-03-15 NOTE — ED Notes (Signed)
Code Med Activated-Awaiting intubation before CT will assign bay

## 2023-03-15 NOTE — Progress Notes (Signed)
eLink Physician-Brief Progress Note Patient Name: [name] DOB: 1956/07/29 MRN: [id number]   Date of Service  03/15/2023  HPI/Events of Note  Patient following commands when all sedation turned off.  eICU Interventions  Patient re-sedated to prevent self-extubation, normothermia protocol not indicated.        Thomasene Lot Maleiah Dula 03/15/2023, 11:14 PM

## 2023-03-15 NOTE — ED Triage Notes (Signed)
Pt coming from home, complaining of upper left abdominal pain. Nausea and vomiting.  Hr 116 EKG unremarkable.  of fentanyl 18g LAC

## 2023-03-15 NOTE — ED Provider Notes (Signed)
[place] EMERGENCY DEPARTMENT AT [facility] Provider Note   CSN: [id number] Arrival date & time: 03/15/23  1230     History  Chief Complaint  Patient presents with   Abdominal Pain    Shane Sampson is a 67 y.o. male.  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  Surgery consult is this week.  Today, pt woke up fine, then developed severe left flank pain about 1 hr ago.  He has had some n/v.  He was given 100 mcg IV by EMS.          Home Medications Prior to Admission medications   Medication Sig Start Date End Date Taking? Authorizing Provider  aspirin 81 MG tablet Take 81 mg by mouth daily.    [provider]  atorvastatin (LIPITOR) 80 MG tablet Take 1 tablet (80 mg total) by mouth daily. 03/05/23   Arty Baumgartner, NP  carvedilol (COREG) 6.25 MG tablet Take 1 tablet (6.25 mg total) by mouth 2 (two) times daily. 03/11/23   Carlos Levering, NP  empagliflozin (JARDIANCE) 10 MG TABS tablet Take 1 tablet (10 mg total) by mouth daily. 03/05/23   Arty Baumgartner, NP  ezetimibe (ZETIA) 10 MG tablet Take 10 mg by mouth daily.    [provider]  nitroGLYCERIN (NITROSTAT) 0.4 MG SL tablet Place 1 tablet (0.4 mg total) under the tongue every 5 (five) minutes x 3 doses as needed for chest pain. 03/04/23   Arty Baumgartner, NP  sacubitril-valsartan (ENTRESTO) 24-26 MG Take 1 tablet by mouth 2 (two) times daily. 03/04/23   Arty Baumgartner, NP  spironolactone (ALDACTONE) 25 MG tablet Take 1/2 tablet (12.5 mg total) by mouth daily. 03/05/23   Arty Baumgartner, NP  tamsulosin (FLOMAX) 0.4 MG CAPS capsule Take 0.4 mg by mouth daily.    [provider]      Allergies    Patient has no known allergies.    Review of Systems   Review of  Systems  Gastrointestinal:  Positive for abdominal pain, nausea and vomiting.  All other systems reviewed and are negative.   Physical Exam Updated Vital Signs BP 97/79   Pulse (!) 125   Temp 97.7 F (36.5 C) (Oral)   Resp (!) 28   Ht 6' (1.829 m)   Wt 74.4 kg   SpO2 100%   BMI 22.24 kg/m  Physical Exam Vitals and nursing note reviewed.  Constitutional:      General: He is in acute distress.     Appearance: He is well-developed. He is ill-appearing and diaphoretic.  HENT:     Head: Normocephalic and atraumatic.     Mouth/Throat:     Mouth: Mucous membranes are moist.     Pharynx: Oropharynx is clear.  Eyes:     Extraocular Movements: Extraocular movements intact.     Pupils: Pupils are equal, round, and reactive to light.  Cardiovascular:     Rate and Rhythm: Regular rhythm. Tachycardia present.     Heart sounds: Normal heart sounds.  Pulmonary:     Effort: Pulmonary effort is normal.     Breath sounds: Normal breath sounds.  Abdominal:     General: Abdomen is flat. Bowel sounds are normal.     Palpations: Abdomen is  soft.     Tenderness: There is abdominal tenderness in the left upper quadrant.  Skin:    Capillary Refill: Capillary refill takes less than 2 seconds.  Neurological:     General: No focal deficit present.     Mental Status: He is alert and oriented to person, place, and time.  Psychiatric:        Mood and Affect: Mood normal.        Behavior: Behavior normal.     ED Results / Procedures / Treatments   Labs (all labs ordered are listed, but only abnormal results are displayed) Labs Reviewed  CBC WITH DIFFERENTIAL/PLATELET - Abnormal; Notable for the following components:      Result Value   WBC 19.5 (*)    Platelets 494 (*)    Neutro Abs 15.4 (*)    Basophils Absolute 0.2 (*)    Abs Immature Granulocytes 0.15 (*)    All other components within normal limits  COMPREHENSIVE METABOLIC PANEL - Abnormal; Notable for the following components:    Sodium 134 (*)    CO2 19 (*)    Glucose, Bld 419 (*)    BUN 57 (*)    Creatinine, Ser 2.54 (*)    Calcium 8.8 (*)    Total Protein 6.2 (*)    Albumin 3.4 (*)    Total Bilirubin 1.7 (*)    GFR, Estimated 27 (*)    All other components within normal limits  CBG MONITORING, ED - Abnormal; Notable for the following components:   Glucose-Capillary 292 (*)    All other components within normal limits  I-STAT ARTERIAL BLOOD GAS, ED - Abnormal; Notable for the following components:   pH, Arterial 7.260 (*)    pO2, Arterial 306 (*)    Bicarbonate 16.1 (*)    TCO2 17 (*)    Acid-base deficit 10.0 (*)    Sodium 131 (*)    Potassium 6.5 (*)    Calcium, Ion 1.08 (*)    HCT 33.0 (*)    Hemoglobin 11.2 (*)    All other components within normal limits  TROPONIN I (HIGH SENSITIVITY) - Abnormal; Notable for the following components:   Troponin I (High Sensitivity) 84 (*)    All other components within normal limits  CULTURE, BLOOD (ROUTINE X 2)  CULTURE, BLOOD (ROUTINE X 2)  LIPASE, BLOOD  URINALYSIS, ROUTINE W REFLEX MICROSCOPIC  LACTIC ACID, PLASMA  LACTIC ACID, PLASMA  HEMOGLOBIN AND HEMATOCRIT, BLOOD  BLOOD GAS, ARTERIAL  BLOOD GAS, ARTERIAL  TYPE AND SCREEN  PREPARE RBC (CROSSMATCH)  TYPE AND SCREEN  PREPARE RBC (CROSSMATCH)  ABO/RH  TROPONIN I (HIGH SENSITIVITY)    EKG EKG Interpretation  Date/Time:  Tuesday March 15 2023 14:16:15 EDT Ventricular Rate:  127 PR Interval:  169 QRS Duration: 78 QT Interval:  251 QTC Calculation: 365 R Axis:   73 Text Interpretation: Sinus tachycardia Consider right atrial enlargement Nonspecific T abnormalities, lateral leads No significant change since last tracing Confirmed by Jacalyn Lefevre [phone]) on 03/15/2023 3:50:05 PM  Radiology DG Abd Portable 1 View  Result Date: 03/15/2023 CLINICAL DATA:  NG tube placement EXAM: PORTABLE ABDOMEN - 1 VIEW COMPARISON:  Chest radiographs 03/15/2023 FINDINGS: NG tube tip is presumably located within  the lower esophagus is only partly visualized on this exam. Recommend advancement 7-8 cm. Remainder unchanged from CT earlier today. IMPRESSION: NG tube tip is presumably located within the lower esophagus however is only partially visualized. Recommend advancement prior to use. These  results were called by telephone at the time of interpretation on 03/15/2023 at 3:45 pm to provider [facility] , who verbally acknowledged these results. Electronically Signed   By: Minerva Fester M.D.   On: 03/15/2023 15:52   CT Angio Chest/Abd/Pel for Dissection W and/or Wo Contrast  Result Date: 03/15/2023 CLINICAL DATA:  Upper left abdominal pain nausea and vomiting. Acute aortic syndrome suspected EXAM: CT ANGIOGRAPHY CHEST, ABDOMEN AND PELVIS TECHNIQUE: Non-contrast CT of the chest was initially obtained. Multidetector CT imaging through the chest, abdomen and pelvis was performed using the standard protocol during bolus administration of intravenous contrast. Multiplanar reconstructed images and MIPs were obtained and reviewed to evaluate the vascular anatomy. RADIATION DOSE REDUCTION: This exam was performed according to the departmental dose-optimization program which includes automated exposure control, adjustment of the mA and/or kV according to patient size and/or use of iterative reconstruction technique. CONTRAST:  75mL OMNIPAQUE IOHEXOL 350 MG/ML SOLN COMPARISON:  Chest radiograph 03/15/2023; MRI abdomen 03/03/2023; CTA chest 03/01/2023 FINDINGS: CTA CHEST FINDINGS Cardiovascular: Preferential opacification of the thoracic aorta. No evidence of thoracic aortic aneurysm or dissection. Normal heart size. No pericardial effusion. Mediastinum/Nodes: Endotracheal tube tip in the intrathoracic trachea. Unremarkable esophagus. No lymphadenopathy. Lungs/Pleura: No pneumothorax. Trace left pleural effusion. Bibasilar atelectasis/scarring. Trachea and central airways are patent. Musculoskeletal: Minimal and nondisplaced  fractures of the left anterior 2nd-7th ribs and right anterior 2nd-6th ribs. Review of the MIP images confirms the above findings. CTA ABDOMEN AND PELVIS FINDINGS VASCULAR Aorta: Normal in caliber without dissection, penetrating atherosclerotic ulcer, intramural hematoma. Moderate atherosclerotic plaque without hemodynamically significant stenosis. Celiac: Normal variant with the common hepatic artery arising directly from the aorta. The splenic artery is moderately secondary to mass effect from the hematoma (circa series 7/image 146). SMA: Patent. Renals: Patent without aneurysm or dissection. Calcified plaque causes mild narrowing at the origins of both renal arteries. IMA: Patent. Inflow: Scattered atherosclerotic plaque causes up to mild narrowing. No aneurysm or dissection. Veins: No obvious venous abnormality within the limitations of this arterial phase study. Review of the MIP images confirms the above findings. NON-VASCULAR Hepatobiliary: Cholelithiasis. No evidence of cholecystitis. Unremarkable liver. Pancreas: Unremarkable. Spleen: Unremarkable. Adrenals/Urinary Tract: Heterogenous fluid compatible with blood products within the upper left retroperitoneal space extending inferiorly within the pararenal space appears to originate from the left adrenal mass where there is active hemorrhage. The hemorrhage measures approximately 15.0 x 12.6 x 15.9 cm (AP x ML x CC). The borders of the left adrenal mass or obscured by the adjacent hemorrhage however this measures approximately 6.8 x 7.0 cm in the axial plane (7/164). This measured 6.0 by 5.3 cm on MRI 03/03/2023. Unremarkable right adrenal gland. Hemorrhage surrounds the left kidney without mass effect. Bilateral renal cysts which were previously evaluated with MRI 03/03/2023. No follow-up is recommended. No urinary calculi or hydronephrosis. Unremarkable bladder. Stomach/Bowel: Normal caliber large and small bowel. Colonic diverticulosis without  diverticulitis. Normal appendix. Stomach is within normal limits. Lymphatic: No lymphadenopathy. Reproductive: Unremarkable. Other: No free intraperitoneal air. Musculoskeletal: Acute fracture or destructive osseous lesion sclerotic lesions in the bilateral iliac wings are favored to represent bone islands. Review of the MIP images confirms the above findings. IMPRESSION: 1. 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 2. Nondisplaced fractures of the left anterior 2nd-7th ribs and right anterior 2nd-6th ribs. No pneumothorax. Trace left pleural effusion. 3. Increased size of the left adrenal mass, previously evaluated with MRI 03/03/2023 Aortic Atherosclerosis (ICD10-I70.0). Critical Value/emergent results were called by telephone  at the time of interpretation on 03/15/2023 at 3:16 pm to provider [facility] , who verbally acknowledged these results. Electronically Signed   By: Minerva Fester M.D.   On: 03/15/2023 15:24   DG Chest Portable 1 View  Result Date: 03/15/2023 CLINICAL DATA:  intubation confrimation EXAM: PORTABLE CHEST 1 VIEW COMPARISON:  CXR 02/28/23 FINDINGS: Endotracheal tube terminates at the level of the thoracic inlet approximately 4 cm above the carina. Low lung volumes. No pleural effusion. No pneumothorax no focal airspace opacity. Likely unchanged cardiac and mediastinal contours when accounting for differences in lung volumes. Visualized upper abdomen is unremarkable. IMPRESSION: Endotracheal tube terminates at the level of the thoracic inlet. Low lung volumes. Electronically Signed   By: Lorenza Cambridge M.D.   On: 03/15/2023 14:37    Procedures Procedure Name: Intubation Date/Time: 03/15/2023 3:54 PM  Performed by: Jacalyn Lefevre, MDPre-anesthesia Checklist: Patient identified, Patient being monitored, Emergency Drugs available, Timeout performed and Suction available Oxygen Delivery Method: Non-rebreather mask Preoxygenation: Pre-oxygenation with 100% oxygen Induction  Type: Rapid sequence Ventilation: Mask ventilation without difficulty Laryngoscope Size: Glidescope and 3 Tube size: 7.5 mm Placement Confirmation: ETT inserted through vocal cords under direct vision, CO2 detector and Breath sounds checked- equal and bilateral Secured at: 23 cm Tube secured with: ETT holder Dental Injury: Teeth and Oropharynx as per pre-operative assessment     CPR  Date/Time: 03/15/2023 3:56 PM  Performed by: Jacalyn Lefevre, MD Authorized by: Jacalyn Lefevre, MD  CPR Procedure Details:    Outcome: ROSC obtained    CPR performed via ACLS guidelines under my direct supervision.  See RN documentation for details including defibrillator use, medications, doses and timing.     Medications Ordered in ED Medications  fentaNYL in NS (65mcg/ml) infusion-PREMIX ( Intravenous Not Given 03/15/23 1514)  norepinephrine (LEVOPHED) 4mg  in (0.016 mg/mL) premix infusion (20 mcg/min Intravenous New Bag/Given 03/15/23 1502)  0.9 %  sodium chloride infusion (Manually program via Guardrails IV Fluids) (has no administration in time range)  0.9 %  sodium chloride infusion (Manually program via Guardrails IV Fluids) (has no administration in time range)  0.9 %  sodium chloride infusion (Manually program via Guardrails IV Fluids) (has no administration in time range)  0.9 %  sodium chloride infusion (has no administration in time range)  pantoprazole (PROTONIX) injection 40 mg (has no administration in time range)  Oral care mouth rinse (has no administration in time range)  Oral care mouth rinse (has no administration in time range)  fentaNYL (SUBLIMAZE) injection 25-100 mcg (has no administration in time range)  propofol (DIPRIVAN) 1000 MG/100ML infusion (has no administration in time range)  midazolam (VERSED) injection 1-2 mg (has no administration in time range)  insulin aspart (novoLOG) injection 0-15 Units (has no administration in time range)  sodium chloride 0.9  % bolus 1,000 mL (0 mLs Intravenous Stopped 03/15/23 1512)  ondansetron (ZOFRAN) injection 4 mg (4 mg Intravenous Given 03/15/23 1257)  morphine (PF) 4 MG/ML injection 4 mg (4 mg Intravenous Given 03/15/23 1257)  EPINEPHrine (ADRENALIN) 1 MG/10ML injection (1 mg Intravenous Given 03/15/23 1403)  sodium chloride 0.9 % bolus 1,000 mL (1,000 mLs Intravenous New Bag/Given 03/15/23 1430)  iohexol (OMNIPAQUE) 350 MG/ML injection 75 mL (75 mLs Intravenous Contrast Given 03/15/23 1445)    ED Course/ Medical Decision Making/ A&P  Medical Decision Making Amount and/or Complexity of Data Reviewed Labs: ordered. Radiology: ordered.  Risk Prescription drug management.   This patient presents to the ED for concern of abd pain, this involves an extensive number of treatment options, and is a complaint that carries with it a high risk of complications and morbidity.  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  The pertinent results include:  cbc with wbc elevated at 19.5; cmp with cr 2.54 and glucose elevated at 419; trop 84, lip 48   Imaging Studies ordered:  I ordered imaging studies including cxr and ct chest/abd/pelvis  I independently visualized and interpreted imaging which showed  CXR: Endotracheal tube terminates at the level of the thoracic inlet. Low  lung volumes.  CT chest/abd/pelvis:  Active hemorrhage arising from the left adrenal mass.  2. Nondisplaced fractures of the left anterior 2nd-7th ribs and  right anterior 2nd-6th ribs. No pneumothorax. Trace  left pleural  effusion.  3. Increased size of the left adrenal mass, previously evaluated  with MRI 03/03/2023    Aortic Atherosclerosis (ICD10-I70.0).   I agree with the radiologist interpretation   Cardiac Monitoring:  The patient was maintained on a cardiac monitor.  I personally viewed and interpreted the cardiac monitored which showed an underlying rhythm of: st   Medicines ordered and prescription drug management:  I have reviewed the patients home medicines and have made adjustments as needed   Test Considered:  ct   Critical Interventions:  Ivfs   Consultations Obtained:  I requested consultation with the ccm,  and discussed lab and imaging findings as well as pertinent plan -they will admit Pt d/w surgery who will see pt.  They request I also speak with IR. I spoke with Dr. Fredia Sorrow (IR) who will see pt.  Problem List / ED Course:  Abd pain:  source is bleeding from adrenal tumor.  While here, pt continued to look very uncomfortable.  His bp dropped and he coded.  It was about 1 hr after getting pain meds, so not related to that.  CPR done and pt bagged.  He was given 1 epi.  He did have ROSC and started to wake up a little.  I did intubate pt.  Pt's bp continued to be low, so he was started on levophed and given addn ivfs.  Stat CT chest/abd/pelvis ordered which showed an adrenal gland hemorrhage.  I ordered blood, spoke to Gen surgery and with IR.  They will see pt.  ICU will admit.   Multiple rib fx:  sustained during CPR.   Reevaluation:  After the interventions noted above, I reevaluated the patient and found that they have :improved   Social Determinants of Health:  Lives at home   Dispostion:  After consideration of the diagnostic results and the patients response to treatment, I feel that the patent would benefit from admission.    CRITICAL CARE Performed by: Jacalyn Lefevre   Total critical care time: 60 minutes  Critical care time was  exclusive of separately billable procedures and treating other patients.  Critical care was necessary to treat or prevent imminent or life-threatening  deterioration.  Critical care was time spent personally by me on the following activities: development of treatment plan with patient and/or surrogate as well as nursing, discussions with consultants, evaluation of patient's response to treatment, examination of patient, obtaining history from patient or surrogate, ordering and performing treatments and interventions, ordering and review of laboratory studies, ordering and review of radiographic studies, pulse oximetry and re-evaluation of patient's condition.         Final Clinical Impression(s) / ED Diagnoses Final diagnoses:  Cardiac arrest (HCC)  Closed fracture of multiple ribs of both sides, initial encounter  Hemoperitoneum  Mass of left adrenal gland [facility])    Rx / DC Orders ED Discharge Orders     None         Jacalyn Lefevre, MD 03/15/23 1558

## 2023-03-15 NOTE — Progress Notes (Signed)
eLink Physician-Brief Progress Note Patient Name: Shane Sampson DOB: 1956-05-07 MRN: [id number]   Date of Service  03/15/2023  HPI/Events of Note  Patient admitted to the ICU following presentation to the ED with severe abdominal pain which was followed shortly thereafter by PEA cardiac arrest secondary to hemorrhaging adrenal mass, ROSC was achieved and patient transferred to IR were he underwent emergent coiling of the bleeding vessel, he was intubated following cardiac arrest and is admitted to the ICU intubated and mechanically ventilated.  eICU Interventions  New Patient Evaluation.        Migdalia Dk 03/15/2023, 7:22 PM

## 2023-03-15 NOTE — H&P (Signed)
NAME:  Shane Sampson, MRN:  [id number], DOB:  Mar 21, 1956, LOS: 0 ADMISSION DATE:  03/15/2023, CONSULTATION DATE:  03/15/2023 REFERRING MD:  Dr. Particia Nearing, ER, CHIEF COMPLAINT:  Abdominal pain   History of Present Illness:  67 yo male with recent dx of Takotsubo CM and Lt adrenal mass presented with sudden onset of severe Lt abdominal pain.  Had PEA cardiac arrest in ER with ROSC after about 5 minutes.  CT abd/pelvis showed hemorrhage from adrenal mass.  Pertinent  Medical History  HTN, HLD, Pre DM, BPH, CKD 3a, Takotsubo CM, Lt adrenal mass  Significant Hospital Events: Including procedures, antibiotic start and stop dates in addition to other pertinent events   6/04 Admit, surgery and IR consulted  Interim History / Subjective:  Hx from chart, medical team and pt's wife  Objective   Blood pressure 102/73, pulse (!) 122, temperature (!) 93.9 F (34.4 C), resp. rate 18, height 6' (1.829 m), weight 74.4 kg, SpO2 100 %.    Vent Mode: PRVC FiO2 (%):  [100 %] 100 % Set Rate:  [20 bmp] 20 bmp Vt Set:  [580 mL] 580 mL PEEP:  [5 cmH20] 5 cmH20 Plateau Pressure:  [14 cmH20] 14 cmH20   Intake/Output Summary (Last 24 hours) at 03/15/2023 1847 Last data filed at 03/15/2023 1712 Gross per 24 hour  Intake 2263.44 ml  Output --  Net 2263.44 ml   Filed Weights   03/15/23 1243  Weight: 74.4 kg    Examination:  General - sedated Eyes - pupils reactive ENT - ETT in place Cardiac - regular rate/rhythm, no murmur Chest - equal breath sounds b/l, no wheezing or rales Abdomen - soft, non tender, + bowel sounds Extremities - no cyanosis, clubbing, or edema Skin - no rashes Neuro - RASS -2   Resolved Hospital Problem list     Assessment & Plan:   Lt adrenal mass with hemorrhage. - IR to take for embolization - f/u urine studies from last admission to assess for pheochromocytoma - surgery consulted  Acute hypoxic respiratory failure in setting of cardiac arrest. - full vent support -  f/u CXR, ABG  Hemorrhagic shock with lactic acidosis. - f/u CBC - continue IV fluids - pressors to keep MAP > 65  Takotsubo CM. PEA cardiac arrest. - cardiology consulted  Pre-DM with hyperglycemia. - SSI  AKI. Hyperkalemia. CKD53a. - f/u BMET - monitor urine outpt  Best Practice (right click and "Reselect all SmartList Selections" daily)   Diet/type: NPO DVT prophylaxis: SCD GI prophylaxis: PPI Lines: Central line Foley:  Yes, and it is still needed Code Status:  full code Last date of multidisciplinary goals of care discussion [updated wife at bedside]  Labs   CBC: Recent Labs  Lab 03/15/23 1251 03/15/23 1552 03/15/23 1646  WBC 19.5*  --   --   NEUTROABS 15.4*  --   --   HGB 14.6 11.2* 14.2  HCT 45.6 33.0* 43.6  MCV 93.4  --   --   PLT 494*  --   --     Basic Metabolic Panel: Recent Labs  Lab 03/11/23 1518 03/15/23 1251 03/15/23 1552  NA 138 134* 131*  K 5.7* 4.5 6.5*  CL 99 102  --   CO2 21 19*  --   GLUCOSE 145* 419*  --   BUN 39* 57*  --   CREATININE 1.74* 2.54*  --   CALCIUM 10.8* 8.8*  --    GFR: Estimated Creatinine Clearance: 29.3 mL/min (A) (  by C-G formula based on SCr of 2.54 mg/dL (H)). Recent Labs  Lab 03/15/23 1251 03/15/23 1646  WBC 19.5*  --   LATICACIDVEN  --  2.5*    Liver Function Tests: Recent Labs  Lab 03/15/23 1251  AST 20  ALT 24  ALKPHOS 82  BILITOT 1.7*  PROT 6.2*  ALBUMIN 3.4*   Recent Labs  Lab 03/15/23 1251  LIPASE 48   No results for input(s): "AMMONIA" in the last 168 hours.  ABG    Component Value Date/Time   PHART 7.260 (L) 03/15/2023 1552   PCO2ART 35.6 03/15/2023 1552   PO2ART 306 (H) 03/15/2023 1552   HCO3 16.1 (L) 03/15/2023 1552   TCO2 17 (L) 03/15/2023 1552   ACIDBASEDEF 10.0 (H) 03/15/2023 1552   O2SAT 100 03/15/2023 1552     Coagulation Profile: No results for input(s): "INR", "PROTIME" in the last 168 hours.  Cardiac Enzymes: No results for input(s): "CKTOTAL", "CKMB",  "CKMBINDEX", "TROPONINI" in the last 168 hours.  HbA1C: Hgb A1c MFr Bld  Date/Time Value Ref Range Status  03/01/2023 01:24 AM 6.3 (H) 4.8 - 5.6 % Final    Comment:    (NOTE) Pre diabetes:          5.7%-6.4%  Diabetes:              >6.4%  Glycemic control for   <7.0% adults with diabetes     CBG: Recent Labs  Lab 03/15/23 1407  GLUCAP 292*    Review of Systems:   Unable to obtain  Past Medical History:  He,  has a past medical history of Hypertension.   Surgical History:   Past Surgical History:  Procedure Laterality Date   HERNIA REPAIR     LEFT HEART CATH AND CORONARY ANGIOGRAPHY N/A 03/01/2023   Procedure: LEFT HEART CATH AND CORONARY ANGIOGRAPHY;  Surgeon: Corky Crafts, MD;  Location: [facility] INVASIVE CV LAB;  Service: Cardiovascular;  Laterality: N/A;     Social History:   reports that he has never smoked. He has never used smokeless tobacco. He reports current alcohol use of about 1.0 standard drink of alcohol per week. He reports that he does not use drugs.   Family History:  His family history is not on file.   Allergies No Known Allergies   Home Medications  Prior to Admission medications   Medication Sig Start Date End Date Taking? Authorizing Provider  aspirin 81 MG tablet Take 81 mg by mouth daily.    [provider]  atorvastatin (LIPITOR) 80 MG tablet Take 1 tablet (80 mg total) by mouth daily. 03/05/23   Arty Baumgartner, NP  carvedilol (COREG) 6.25 MG tablet Take 1 tablet (6.25 mg total) by mouth 2 (two) times daily. 03/11/23   Carlos Levering, NP  empagliflozin (JARDIANCE) 10 MG TABS tablet Take 1 tablet (10 mg total) by mouth daily. 03/05/23   Arty Baumgartner, NP  ezetimibe (ZETIA) 10 MG tablet Take 10 mg by mouth daily.    [provider]  nitroGLYCERIN (NITROSTAT) 0.4 MG SL tablet Place 1 tablet (0.4 mg total) under the tongue every 5 (five) minutes x 3 doses as needed for chest pain. 03/04/23   Arty Baumgartner,  NP  sacubitril-valsartan (ENTRESTO) 24-26 MG Take 1 tablet by mouth 2 (two) times daily. 03/04/23   Arty Baumgartner, NP  spironolactone (ALDACTONE) 25 MG tablet Take 1/2 tablet (12.5 mg total) by mouth daily. 03/05/23   Arty Baumgartner, NP  tamsulosin (FLOMAX) 0.4 MG CAPS capsule Take 0.4 mg by mouth daily.    [provider]     Critical care time: 42 minutes  Coralyn Helling, MD [facility] Pulmonary/Critical Care Pager - [phone] or [phone] 03/15/2023, 6:55 PM

## 2023-03-15 NOTE — Procedures (Signed)
Arterial Catheter Insertion Procedure Note  Shane Sampson  [id number]  January 03, 1956  Date:03/15/23  Time:4:57 PM    Provider Performing: Cheri Fowler    Procedure: Insertion of Arterial Line (40981) with US guidance (19147)   Indication(s) Blood pressure monitoring and/or need for frequent ABGs  Consent Risks of the procedure as well as the alternatives and risks of each were explained to the patient and/or caregiver.  Consent for the procedure was obtained and is signed in the bedside chart  Anesthesia None   Time Out Verified patient identification, verified procedure, site/side was marked, verified correct patient position, special equipment/implants available, medications/allergies/relevant history reviewed, required imaging and test results available.   Sterile Technique Maximal sterile technique including full sterile barrier drape, hand hygiene, sterile gown, sterile gloves, mask, hair covering, sterile ultrasound probe cover (if used).   Procedure Description Area of catheter insertion was cleaned with chlorhexidine and draped in sterile fashion. With real-time ultrasound guidance an arterial catheter was placed into the left  Axillary  artery.  Appropriate arterial tracings confirmed on monitor.     Complications/Tolerance None; patient tolerated the procedure well.   EBL Minimal   Specimen(s) None

## 2023-03-15 NOTE — Code Documentation (Signed)
CPR started.

## 2023-03-15 NOTE — Anesthesia Postprocedure Evaluation (Signed)
Anesthesia Post Note  Patient: Shane Sampson  Procedure(s) Performed: IR WITH ANESTHESIA     Patient location during evaluation: SICU Anesthesia Type: General Level of consciousness: sedated Pain management: pain level controlled Vital Signs Assessment: post-procedure vital signs reviewed and stable Respiratory status: patient remains intubated per anesthesia plan Cardiovascular status: stable Postop Assessment: no apparent nausea or vomiting Anesthetic complications: no  No notable events documented.  Last Vitals:  Vitals:   03/15/23 1653 03/15/23 1940  BP: 102/73   Pulse:    Resp: 18   Temp: (!) 34.4 C   SpO2:  100%    Last Pain:  Vitals:   03/15/23 1245  TempSrc: Oral  PainSc:                  [name]

## 2023-03-16 ENCOUNTER — Inpatient Hospital Stay (HOSPITAL_COMMUNITY): Payer: 59

## 2023-03-16 ENCOUNTER — Encounter (HOSPITAL_COMMUNITY): Payer: Self-pay | Admitting: Radiology

## 2023-03-16 DIAGNOSIS — I5181 Takotsubo syndrome: Secondary | ICD-10-CM

## 2023-03-16 DIAGNOSIS — I428 Other cardiomyopathies: Secondary | ICD-10-CM

## 2023-03-16 DIAGNOSIS — R0603 Acute respiratory distress: Secondary | ICD-10-CM | POA: Diagnosis not present

## 2023-03-16 DIAGNOSIS — E2749 Other adrenocortical insufficiency: Secondary | ICD-10-CM | POA: Diagnosis not present

## 2023-03-16 DIAGNOSIS — E278 Other specified disorders of adrenal gland: Secondary | ICD-10-CM | POA: Diagnosis not present

## 2023-03-16 DIAGNOSIS — I5022 Chronic systolic (congestive) heart failure: Secondary | ICD-10-CM

## 2023-03-16 LAB — CBC
HCT: 37.7 % — ABNORMAL LOW (ref 39.0–52.0)
HCT: 38.9 % — ABNORMAL LOW (ref 39.0–52.0)
HCT: 34.7 % — ABNORMAL LOW (ref 39.0–52.0)
Hemoglobin: 12.9 g/dL — ABNORMAL LOW (ref 13.0–17.0)
Hemoglobin: 13.7 g/dL (ref 13.0–17.0)
Hemoglobin: 11.9 g/dL — ABNORMAL LOW (ref 13.0–17.0)
MCH: 31.1 pg (ref 26.0–34.0)
MCH: 30.2 pg (ref 26.0–34.0)
MCHC: 35.2 g/dL (ref 30.0–36.0)
MCHC: 34.3 g/dL (ref 30.0–36.0)
MCV: 88.2 fL (ref 80.0–100.0)
MCV: 88.1 fL (ref 80.0–100.0)
Platelets: 272 10*3/uL (ref 150–400)
Platelets: 307 10*3/uL (ref 150–400)
Platelets: 285 10*3/uL (ref 150–400)
RBC: 4.32 MIL/uL (ref 4.22–5.81)
RBC: 4.41 MIL/uL (ref 4.22–5.81)
RBC: 3.94 MIL/uL — ABNORMAL LOW (ref 4.22–5.81)
RDW: 13.9 % (ref 11.5–15.5)
RDW: 14.4 % (ref 11.5–15.5)
RDW: 14.6 % (ref 11.5–15.5)
WBC: 52.3 10*3/uL (ref 4.0–10.5)
WBC: 55.8 10*3/uL (ref 4.0–10.5)
WBC: 37.2 10*3/uL — ABNORMAL HIGH (ref 4.0–10.5)
nRBC: 0 % (ref 0.0–0.2)
nRBC: 0 % (ref 0.0–0.2)

## 2023-03-16 LAB — TRIGLYCERIDES: Triglycerides: 69 mg/dL (ref <150)

## 2023-03-16 LAB — GLUCOSE, CAPILLARY
Glucose-Capillary: 279 mg/dL — ABNORMAL HIGH (ref 70–99)
Glucose-Capillary: 356 mg/dL — ABNORMAL HIGH (ref 70–99)
Glucose-Capillary: 140 mg/dL — ABNORMAL HIGH (ref 70–99)
Glucose-Capillary: 156 mg/dL — ABNORMAL HIGH (ref 70–99)
Glucose-Capillary: 144 mg/dL — ABNORMAL HIGH (ref 70–99)
Glucose-Capillary: 42 mg/dL — CL (ref 70–99)
Glucose-Capillary: 186 mg/dL — ABNORMAL HIGH (ref 70–99)
Glucose-Capillary: 147 mg/dL — ABNORMAL HIGH (ref 70–99)
Glucose-Capillary: 153 mg/dL — ABNORMAL HIGH (ref 70–99)

## 2023-03-16 LAB — ECHOCARDIOGRAM COMPLETE
Height: 72 in
Weight: 2624 oz

## 2023-03-16 LAB — BPAM RBC
Blood Product Expiration Date: 202406242359
Blood Product Expiration Date: 202406242359
Blood Product Expiration Date: 202406292359
ISSUE DATE / TIME: 202406041545
ISSUE DATE / TIME: 202406041611
Unit Type and Rh: 5100
Unit Type and Rh: 5100

## 2023-03-16 LAB — COMPREHENSIVE METABOLIC PANEL
ALT: 21 U/L (ref 0–44)
ALT: 21 U/L (ref 0–44)
AST: 20 U/L (ref 15–41)
AST: 26 U/L (ref 15–41)
Albumin: 2.6 g/dL — ABNORMAL LOW (ref 3.5–5.0)
Albumin: 2.6 g/dL — ABNORMAL LOW (ref 3.5–5.0)
Alkaline Phosphatase: 63 U/L (ref 38–126)
Alkaline Phosphatase: 61 U/L (ref 38–126)
Anion gap: 9 (ref 5–15)
Anion gap: 14 (ref 5–15)
BUN: 52 mg/dL — ABNORMAL HIGH (ref 8–23)
BUN: 58 mg/dL — ABNORMAL HIGH (ref 8–23)
CO2: 25 mmol/L (ref 22–32)
CO2: 23 mmol/L (ref 22–32)
Calcium: 7.9 mg/dL — ABNORMAL LOW (ref 8.9–10.3)
Calcium: 8.4 mg/dL — ABNORMAL LOW (ref 8.9–10.3)
Chloride: 104 mmol/L (ref 98–111)
Chloride: 98 mmol/L (ref 98–111)
Creatinine, Ser: 2.64 mg/dL — ABNORMAL HIGH (ref 0.61–1.24)
Creatinine, Ser: 3.02 mg/dL — ABNORMAL HIGH (ref 0.61–1.24)
GFR, Estimated: 26 mL/min — ABNORMAL LOW (ref >60)
GFR, Estimated: 22 mL/min — ABNORMAL LOW (ref >60)
Glucose, Bld: 170 mg/dL — ABNORMAL HIGH (ref 70–99)
Glucose, Bld: 177 mg/dL — ABNORMAL HIGH (ref 70–99)
Potassium: 4.5 mmol/L (ref 3.5–5.1)
Potassium: 4.7 mmol/L (ref 3.5–5.1)
Sodium: 138 mmol/L (ref 135–145)
Sodium: 135 mmol/L (ref 135–145)
Total Bilirubin: 1.9 mg/dL — ABNORMAL HIGH (ref 0.3–1.2)
Total Bilirubin: 1.7 mg/dL — ABNORMAL HIGH (ref 0.3–1.2)
Total Protein: 4.9 g/dL — ABNORMAL LOW (ref 6.5–8.1)
Total Protein: 4.9 g/dL — ABNORMAL LOW (ref 6.5–8.1)

## 2023-03-16 LAB — TYPE AND SCREEN
Antibody Screen: NEGATIVE
Unit division: 0
Unit division: 0

## 2023-03-16 LAB — PROTIME-INR
INR: 1.1 (ref 0.8–1.2)
Prothrombin Time: 14.4 seconds (ref 11.4–15.2)

## 2023-03-16 LAB — TROPONIN I (HIGH SENSITIVITY)
Troponin I (High Sensitivity): 105 ng/L (ref <18)
Troponin I (High Sensitivity): 83 ng/L — ABNORMAL HIGH (ref <18)

## 2023-03-16 LAB — LACTIC ACID, PLASMA: Lactic Acid, Venous: 1.8 mmol/L (ref 0.5–1.9)

## 2023-03-16 MED ORDER — ONDANSETRON HCL 4 MG/2ML IJ SOLN: 4.0000 mg | Freq: Four times a day (QID) | INTRAMUSCULAR | Status: DC

## 2023-03-16 MED ORDER — ACETAMINOPHEN 325 MG PO TABS
650.0000 mg | ORAL_TABLET | ORAL | Status: DC | PRN
  Administered 2023-03-16: 650 mg via ORAL
  Filled 2023-03-16: qty 2

## 2023-03-16 MED ORDER — DEXTROSE 50 % IV SOLN
INTRAVENOUS | Status: AC
  Filled 2023-03-16: qty 50

## 2023-03-16 MED ORDER — ACETAMINOPHEN 325 MG PO TABS: 650.0000 mg | ORAL_TABLET | ORAL | Status: DC | PRN

## 2023-03-16 MED ORDER — SODIUM CHLORIDE 0.9 % IV SOLN: INTRAVENOUS | Status: DC

## 2023-03-16 MED ORDER — ONDANSETRON HCL 4 MG/2ML IJ SOLN
INTRAMUSCULAR | Status: AC
  Filled 2023-03-16: qty 2

## 2023-03-16 MED ORDER — ONDANSETRON HCL 4 MG/2ML IJ SOLN
4.0000 mg | Freq: Once | INTRAMUSCULAR | Status: AC
  Administered 2023-03-16: 4 mg via INTRAVENOUS

## 2023-03-16 MED ORDER — CHLORHEXIDINE GLUCONATE CLOTH 2 % EX PADS: 6.0000 | MEDICATED_PAD | Freq: Every day | CUTANEOUS | Status: DC

## 2023-03-16 MED ORDER — CALCIUM GLUCONATE-NACL 2-0.675 GM/100ML-% IV SOLN
2.0000 g | Freq: Once | INTRAVENOUS | Status: AC
  Administered 2023-03-16: 2000 mg via INTRAVENOUS
  Filled 2023-03-16: qty 100

## 2023-03-16 MED ORDER — MORPHINE SULFATE (PF) 2 MG/ML IV SOLN
INTRAVENOUS | Status: AC
  Administered 2023-03-16: 2 mg via INTRAMUSCULAR
  Filled 2023-03-16: qty 1

## 2023-03-16 MED ORDER — ORAL CARE MOUTH RINSE
15.0000 mL | OROMUCOSAL | Status: DC
  Administered 2023-03-16 (×2): 15 mL via OROMUCOSAL

## 2023-03-16 MED ORDER — ONDANSETRON HCL 4 MG/2ML IJ SOLN
4.0000 mg | INTRAMUSCULAR | Status: DC | PRN
  Administered 2023-03-16 – 2023-03-17 (×2): 4 mg via INTRAVENOUS
  Filled 2023-03-16: qty 2

## 2023-03-16 MED ORDER — DEXTROSE-SODIUM CHLORIDE 5-0.9 % IV SOLN: INTRAVENOUS | Status: DC

## 2023-03-16 MED ORDER — ONDANSETRON HCL 4 MG/2ML IJ SOLN
4.0000 mg | Freq: Four times a day (QID) | INTRAMUSCULAR | Status: DC | PRN
  Administered 2023-03-16: 4 mg via INTRAVENOUS
  Filled 2023-03-16 (×2): qty 2

## 2023-03-16 MED ORDER — MORPHINE SULFATE (PF) 2 MG/ML IV SOLN
2.0000 mg | Freq: Once | INTRAVENOUS | Status: AC
  Administered 2023-03-16: 2 mg via INTRAVENOUS
  Filled 2023-03-16: qty 1

## 2023-03-16 MED ORDER — OXYCODONE HCL 5 MG PO TABS
5.0000 mg | ORAL_TABLET | Freq: Four times a day (QID) | ORAL | Status: DC | PRN
  Administered 2023-03-16 – 2023-03-17 (×2): 5 mg via ORAL
  Filled 2023-03-16 (×2): qty 1

## 2023-03-16 MED ORDER — MORPHINE SULFATE (PF) 2 MG/ML IV SOLN: 2.0000 mg | Freq: Once | INTRAVENOUS | Status: AC

## 2023-03-16 NOTE — Progress Notes (Signed)
eLink Physician-Brief Progress Note Patient Name: Shane Sampson DOB: [birth year] MRN: [id number]   Date of Service  03/16/2023  HPI/Events of Note  Patient with sub-optimal pain control, Morphine 2 mg iv x 1 ordered + Oxycodone 5 mg po Q 6 hours PRN pain.  eICU Interventions  See above.        Shane Sampson 03/16/2023, 11:00 PM

## 2023-03-16 NOTE — Progress Notes (Signed)
Rounding Note    Patient Name: Shane Sampson Date of Encounter: 03/16/2023  [facility] Cardiologist: Little Ishikawa, MD   Subjective   Intubated but awake and responding to questions. No chest pain.   Inpatient Medications    Scheduled Meds:  Chlorhexidine Gluconate Cloth  6 each Topical Daily   hydrocortisone sod succinate (SOLU-CORTEF) inj  100 mg Intravenous Q8H   insulin aspart  0-15 Units Subcutaneous Q4H   mouth rinse  15 mL Mouth Rinse Q2H   pantoprazole (PROTONIX) IV  40 mg Intravenous Daily   Continuous Infusions:  sodium chloride     calcium gluconate 2,000 mg (03/16/23 0813)   EPINEPHrine (ADRENALIN) 10 mg in sodium chloride 0.9 % 250 mL (0.04 mg/mL) infusion Stopped (03/15/23 1712)   fentaNYL infusion INTRAVENOUS 250 mcg/hr (03/16/23 0600)   norepinephrine (LEVOPHED) Adult infusion Stopped (03/15/23 2130)   propofol (DIPRIVAN) infusion Stopped (03/15/23 2130)   sodium bicarbonate 150 mEq in dextrose 5 % 1,150 mL infusion 125 mL/hr at 03/16/23 0616   vasopressin Stopped (03/15/23 2103)   PRN Meds: acetaminophen, fentaNYL (SUBLIMAZE) injection, midazolam, mouth rinse   Vital Signs    Vitals:   03/16/23 0630 03/16/23 0700 03/16/23 0729 03/16/23 0750  BP:  121/85  (!) 89/64  Pulse: (!) 107 (!) 116  (!) 116  Resp: 20 20  20   Temp: 99 F (37.2 C) 98.8 F (37.1 C) 98.8 F (37.1 C)   TempSrc:      SpO2: 97% 97%  96%  Weight:      Height:        Intake/Output Summary (Last 24 hours) at 03/16/2023 0828 Last data filed at 03/16/2023 0600 Gross per 24 hour  Intake 4763.36 ml  Output --  Net 4763.36 ml      03/15/2023   12:43 PM 03/11/2023    2:23 PM 03/04/2023    4:07 AM  Last 3 Weights  Weight (lbs) 164 lb 166 lb 12.8 oz 164 lb  Weight (kg) 74.39 kg 75.66 kg 74.39 kg      Telemetry    Sinus tachycardia 110-120s - Personally Reviewed  ECG    ST with HR 127 - Personally Reviewed  Physical Exam   GEN: Intubated but awake and  responding to questions  Neck: No JVD Cardiac: Tachycardic, regular, no murmurs Respiratory: Coarse vent sounds GI: Soft, nontender, non-distended  MS: No edema; No deformity. Neuro:  Intubated but responding to questions Psych: Normal affect   Labs    High Sensitivity Troponin:   Recent Labs  Lab 02/28/23 2115 02/28/23 2335 03/15/23 1251 03/15/23 1646 03/15/23 2030  TROPONINIHS 294* 1,420* 84* 96* 126*     Chemistry Recent Labs  Lab 03/15/23 1251 03/15/23 1552 03/15/23 1942 03/15/23 2030 03/16/23 0318  NA 134*   < > 141 139 138  K 4.5   < > 5.3* 4.6 4.5  CL 102  --   --  107 104  CO2 19*  --   --  22 25  GLUCOSE 419*  --   --  166* 170*  BUN 57*  --   --  54* 52*  CREATININE 2.54*  --   --  2.43* 2.64*  CALCIUM 8.8*  --   --  7.2* 7.9*  PROT 6.2*  --   --  4.2* 4.9*  ALBUMIN 3.4*  --   --  2.5* 2.6*  AST 20  --   --  15 20  ALT 24  --   --  21 21  ALKPHOS 82  --   --  54 63  BILITOT 1.7*  --   --  2.0* 1.9*  GFRNONAA 27*  --   --  28* 26*  ANIONGAP 13  --   --  10 9   < > = values in this interval not displayed.    Lipids  Recent Labs  Lab 03/16/23 0318  TRIG 69    Hematology Recent Labs  Lab 03/15/23 2030 03/15/23 2120 03/16/23 0318  WBC PENDING 57.3* 55.8*  RBC 4.32 4.39 4.41  HGB 12.9* 13.6 13.7  HCT 37.7* 38.7* 38.9*  MCV 87.3 88.2 88.2  MCH 29.9 31.0 31.1  MCHC 34.2 35.1 35.2  RDW 13.9 14.1 14.4  PLT 272 282 307   Thyroid No results for input(s): "TSH", "FREET4" in the last 168 hours.  BNPNo results for input(s): "BNP", "PROBNP" in the last 168 hours.  DDimer No results for input(s): "DDIMER" in the last 168 hours.   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 The patient presents for emergent arteriography and embolization. EXAM: 1. ULTRASOUND GUIDANCE FOR VASCULAR ACCESS OF THE  RIGHT COMMON FEMORAL ARTERY 2. SELECTIVE ARTERIOGRAPHY OF THE LEFT ADRENAL ARTERY (FIRST ORDER OFF OF ABDOMINAL AORTA) 3. PARTICLE EMBOLIZATION OF THE LEFT ADRENAL ARTERY FOR TUMOR ISCHEMIA MEDICATIONS: No additional medications. ANESTHESIA/SEDATION: The patient was intubated during the procedure and monitored by the Department of Anesthesia. CONTRAST:  50 mL Visipaque 320 FLUOROSCOPY TIME:  Radiation Exposure Index (as provided by the fluoroscopic device): 458 mGy Kerma COMPLICATIONS: None immediate. PROCEDURE: Informed consent was obtained from the patient's wife following explanation of the procedure, risks, benefits and alternatives. The patient's wife understands, agrees and consents for the procedure. All questions were addressed. A time out was performed prior to the initiation of the procedure. Maximal barrier sterile technique utilized including caps, mask, sterile gowns, sterile gloves, large sterile drape, hand hygiene, and chlorhexidine prep. Ultrasound was performed to confirm patency of the right common femoral artery. An ultrasound image was saved and recorded. Under ultrasound guidance, access of the artery was accomplished with a micropuncture set. A 5 French vascular sheath was then advanced into the artery over a guidewire. A 5 French Cobra catheter was advanced into the abdominal aorta. This was then used to selectively catheterize the origin of the left adrenal artery. Selective arteriography was performed. A microcatheter was then advanced through the 5 French catheter into the proximal aspect of the left adrenal artery. Additional selective arteriography was performed. The catheter was then further advanced into the left adrenal artery and further arteriography performed. Particle embolization was performed in the left renal artery with 300-500 micron sized Embosphere particles. Particles were slowly infused by hand injection in diluted contrast. Ultimately, 3/4 vial of particles were  instilled. Additional arteriography was performed through the microcatheter. Different micro catheters were utilized in attempting further catheter advancement over guidewires to allow placement of embolization coils into the left adrenal artery. Ultimately microcatheter and 5 French catheter were removed. Hemostasis was obtained at the femoral access site utilizing the Angio-Seal device. FINDINGS: Selective left adrenal arteriography demonstrates abnormal vascularity supplying the known mass of the left adrenal gland with a rounded region of hypervascular enhancement noted. With arteriography, there was no active extravasation of contrast noted or discrete pseudoaneurysms. The left adrenal artery was quite tortuous after takeoff and  it was difficult advancing a microcatheter much beyond its initial superiorly oriented segment. Particle embolization was successful in occluding smaller branches supplying tumor and slowing flow significantly in the main branches of the left adrenal artery. Microcatheter purchase could not be accomplished beyond the proximal segment to allow safe placement of embolization coils with inability to advance a catheter over a guidewire. Catheter arteriography demonstrates dissection of the proximal vessel likely from catheter and guidewire manipulation which was the likely cause of inability to advance the microcatheter. For this reason, proximal coil embolization of the left renal artery was not performed. This does allow for potential future repeat embolization of the left adrenal artery once the dissection heals, should the patient require future embolization such as preoperative embolization. IMPRESSION: Technically successful microsphere particle embolization of left adrenal arterial supply to a bleeding left adrenal mass. Particle embolization was performed with 3/4 of a vial of 300-500 micron sized Embosphere particles. Proximal coil embolization could not be performed due to  inability to advance the microcatheter sufficiently. Arteriography did demonstrate proximal dissection of the left adrenal artery during the procedure which was likely the cause of inability to advance the catheter. Lack of use of embolization coils does allow for potential future embolization of the left adrenal artery once the dissection heals. Electronically Signed   By: Irish Lack M.D.   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 The patient presents for emergent arteriography and embolization. EXAM: 1. ULTRASOUND GUIDANCE FOR VASCULAR ACCESS OF THE RIGHT COMMON FEMORAL ARTERY 2. SELECTIVE ARTERIOGRAPHY OF THE LEFT ADRENAL ARTERY (FIRST ORDER OFF OF ABDOMINAL AORTA) 3. PARTICLE EMBOLIZATION OF THE LEFT ADRENAL ARTERY FOR TUMOR ISCHEMIA MEDICATIONS: No additional medications. ANESTHESIA/SEDATION: The patient was intubated during the procedure and monitored by the Department of Anesthesia. CONTRAST:  50 mL Visipaque 320 FLUOROSCOPY TIME:  Radiation Exposure Index (as provided by the fluoroscopic device): 458 mGy Kerma COMPLICATIONS: None immediate. PROCEDURE: Informed consent was obtained from the patient's wife following explanation of the procedure, risks, benefits and alternatives. The patient's wife understands, agrees and consents for the procedure. All questions were addressed. A time out was performed prior to the initiation of the procedure. Maximal barrier sterile technique utilized including caps, mask, sterile gowns, sterile gloves, large sterile drape, hand hygiene, and chlorhexidine prep. Ultrasound was performed to confirm patency of the right common femoral artery. An ultrasound image was saved and recorded. Under ultrasound guidance, access of the artery was accomplished with a micropuncture set. A 5 French vascular sheath was then advanced into the  artery over a guidewire. A 5 French Cobra catheter was advanced into the abdominal aorta. This was then used to selectively catheterize the origin of the left adrenal artery. Selective arteriography was performed. A microcatheter was then advanced through the 5 French catheter into the proximal aspect of the left adrenal artery. Additional selective arteriography was performed. The catheter was then further advanced into the left adrenal artery and further arteriography performed. Particle embolization was performed in the left renal artery with 300-500 micron sized Embosphere particles. Particles were slowly infused by hand injection in diluted contrast. Ultimately, 3/4 vial of particles were instilled. Additional arteriography was performed through the microcatheter. Different micro catheters were utilized in attempting further catheter advancement over guidewires to allow placement of embolization coils into the left adrenal artery. Ultimately microcatheter and 5 French catheter were removed. Hemostasis was obtained  at the femoral access site utilizing the Angio-Seal device. FINDINGS: Selective left adrenal arteriography demonstrates abnormal vascularity supplying the known mass of the left adrenal gland with a rounded region of hypervascular enhancement noted. With arteriography, there was no active extravasation of contrast noted or discrete pseudoaneurysms. The left adrenal artery was quite tortuous after takeoff and it was difficult advancing a microcatheter much beyond its initial superiorly oriented segment. Particle embolization was successful in occluding smaller branches supplying tumor and slowing flow significantly in the main branches of the left adrenal artery. Microcatheter purchase could not be accomplished beyond the proximal segment to allow safe placement of embolization coils with inability to advance a catheter over a guidewire. Catheter arteriography demonstrates dissection of the proximal  vessel likely from catheter and guidewire manipulation which was the likely cause of inability to advance the microcatheter. For this reason, proximal coil embolization of the left renal artery was not performed. This does allow for potential future repeat embolization of the left adrenal artery once the dissection heals, should the patient require future embolization such as preoperative embolization. IMPRESSION: Technically successful microsphere particle embolization of left adrenal arterial supply to a bleeding left adrenal mass. Particle embolization was performed with 3/4 of a vial of 300-500 micron sized Embosphere particles. Proximal coil embolization could not be performed due to inability to advance the microcatheter sufficiently. Arteriography did demonstrate proximal dissection of the left adrenal artery during the procedure which was likely the cause of inability to advance the catheter. Lack of use of embolization coils does allow for potential future embolization of the left adrenal artery once the dissection heals. Electronically Signed   By: Irish Lack M.D.   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 The patient presents for emergent arteriography and embolization. EXAM: 1. ULTRASOUND GUIDANCE FOR VASCULAR ACCESS OF THE RIGHT COMMON FEMORAL ARTERY 2. SELECTIVE ARTERIOGRAPHY OF THE LEFT ADRENAL ARTERY (FIRST ORDER OFF OF ABDOMINAL AORTA) 3. PARTICLE EMBOLIZATION OF THE LEFT ADRENAL ARTERY FOR TUMOR ISCHEMIA MEDICATIONS: No additional medications. ANESTHESIA/SEDATION: The patient was intubated during the procedure and monitored by the Department of Anesthesia. CONTRAST:  50 mL Visipaque 320 FLUOROSCOPY TIME:  Radiation Exposure Index (as provided by the fluoroscopic device): 458 mGy Kerma COMPLICATIONS: None immediate. PROCEDURE: Informed consent was  obtained from the patient's wife following explanation of the procedure, risks, benefits and alternatives. The patient's wife understands, agrees and consents for the procedure. All questions were addressed. A time out was performed prior to the initiation of the procedure. Maximal barrier sterile technique utilized including caps, mask, sterile gowns, sterile gloves, large sterile drape, hand hygiene, and chlorhexidine prep. Ultrasound was performed to confirm patency of the right common femoral artery. An ultrasound image was saved and recorded. Under ultrasound guidance, access of the artery was accomplished with a micropuncture set. A 5 French vascular sheath was then advanced into the artery over a guidewire. A 5 French Cobra catheter was advanced into the abdominal aorta. This was then used to selectively catheterize the origin of the left adrenal artery. Selective arteriography was performed. A microcatheter was then advanced through the 5 French catheter into the proximal aspect of the left adrenal artery. Additional selective arteriography was performed. The catheter was then further advanced into the left adrenal artery and further arteriography performed. Particle embolization was performed in the left renal artery with 300-500 micron sized Embosphere  particles. Particles were slowly infused by hand injection in diluted contrast. Ultimately, 3/4 vial of particles were instilled. Additional arteriography was performed through the microcatheter. Different micro catheters were utilized in attempting further catheter advancement over guidewires to allow placement of embolization coils into the left adrenal artery. Ultimately microcatheter and 5 French catheter were removed. Hemostasis was obtained at the femoral access site utilizing the Angio-Seal device. FINDINGS: Selective left adrenal arteriography demonstrates abnormal vascularity supplying the known mass of the left adrenal gland with a rounded region  of hypervascular enhancement noted. With arteriography, there was no active extravasation of contrast noted or discrete pseudoaneurysms. The left adrenal artery was quite tortuous after takeoff and it was difficult advancing a microcatheter much beyond its initial superiorly oriented segment. Particle embolization was successful in occluding smaller branches supplying tumor and slowing flow significantly in the main branches of the left adrenal artery. Microcatheter purchase could not be accomplished beyond the proximal segment to allow safe placement of embolization coils with inability to advance a catheter over a guidewire. Catheter arteriography demonstrates dissection of the proximal vessel likely from catheter and guidewire manipulation which was the likely cause of inability to advance the microcatheter. For this reason, proximal coil embolization of the left renal artery was not performed. This does allow for potential future repeat embolization of the left adrenal artery once the dissection heals, should the patient require future embolization such as preoperative embolization. IMPRESSION: Technically successful microsphere particle embolization of left adrenal arterial supply to a bleeding left adrenal mass. Particle embolization was performed with 3/4 of a vial of 300-500 micron sized Embosphere particles. Proximal coil embolization could not be performed due to inability to advance the microcatheter sufficiently. Arteriography did demonstrate proximal dissection of the left adrenal artery during the procedure which was likely the cause of inability to advance the catheter. Lack of use of embolization coils does allow for potential future embolization of the left adrenal artery once the dissection heals. Electronically Signed   By: Irish Lack M.D.   On: 03/15/2023 19:10   DG Chest Portable 1 View  Result Date: 03/15/2023 CLINICAL DATA:  Central line placement EXAM: PORTABLE CHEST 1 VIEW  COMPARISON:  Radiographs 03/15/2023 at 2:20 p.m. FINDINGS: Left subclavian central line tip in the mid SVC. Additional catheter tubing projects over the left axilla. This may represent a PICC line with tip near the left subclavian vein. Remainder unchanged from earlier today. Endotracheal tube tip in the intrathoracic trachea proximally 3.1 cm from the carina. Defibrillator pads. Stable cardiomediastinal silhouette. Aortic atherosclerotic calcification. Low lung volumes accentuate pulmonary vascularity. Bibasilar atelectasis/scarring. No definite pleural effusion. No pneumothorax. Known rib fractures are better assessed on CT. IMPRESSION: Left subclavian central line tip in the mid SVC. Additional catheter tubing projects over the left axilla. This may represent a PICC line with tip near the left subclavian vein. Remainder unchanged from earlier today. Electronically Signed   By: Minerva Fester M.D.   On: 03/15/2023 17:46   DG Abd Portable 1 View  Result Date: 03/15/2023 CLINICAL DATA:  NG tube placement EXAM: PORTABLE ABDOMEN - 1 VIEW COMPARISON:  Chest radiographs 03/15/2023 FINDINGS: NG tube tip is presumably located within the lower esophagus is only partly visualized on this exam. Recommend advancement 7-8 cm. Remainder unchanged from CT earlier today. IMPRESSION: NG tube tip is presumably located within the lower esophagus however is only partially visualized. Recommend advancement prior to use. These results were called by telephone at the time of interpretation on 03/15/2023  at 3:45 pm to provider [facility] , who verbally acknowledged these results. Electronically Signed   By: Minerva Fester M.D.   On: 03/15/2023 15:52   CT Angio Chest/Abd/Pel for Dissection W and/or Wo Contrast  Result Date: 03/15/2023 CLINICAL DATA:  Upper left abdominal pain nausea and vomiting. Acute aortic syndrome suspected EXAM: CT ANGIOGRAPHY CHEST, ABDOMEN AND PELVIS TECHNIQUE: Non-contrast CT of the chest was initially  obtained. Multidetector CT imaging through the chest, abdomen and pelvis was performed using the standard protocol during bolus administration of intravenous contrast. Multiplanar reconstructed images and MIPs were obtained and reviewed to evaluate the vascular anatomy. RADIATION DOSE REDUCTION: This exam was performed according to the departmental dose-optimization program which includes automated exposure control, adjustment of the mA and/or kV according to patient size and/or use of iterative reconstruction technique. CONTRAST:  75mL OMNIPAQUE IOHEXOL 350 MG/ML SOLN COMPARISON:  Chest radiograph 03/15/2023; MRI abdomen 03/03/2023; CTA chest 03/01/2023 FINDINGS: CTA CHEST FINDINGS Cardiovascular: Preferential opacification of the thoracic aorta. No evidence of thoracic aortic aneurysm or dissection. Normal heart size. No pericardial effusion. Mediastinum/Nodes: Endotracheal tube tip in the intrathoracic trachea. Unremarkable esophagus. No lymphadenopathy. Lungs/Pleura: No pneumothorax. Trace left pleural effusion. Bibasilar atelectasis/scarring. Trachea and central airways are patent. Musculoskeletal: Minimal and nondisplaced fractures of the left anterior 2nd-7th ribs and right anterior 2nd-6th ribs. Review of the MIP images confirms the above findings. CTA ABDOMEN AND PELVIS FINDINGS VASCULAR Aorta: Normal in caliber without dissection, penetrating atherosclerotic ulcer, intramural hematoma. Moderate atherosclerotic plaque without hemodynamically significant stenosis. Celiac: Normal variant with the common hepatic artery arising directly from the aorta. The splenic artery is moderately secondary to mass effect from the hematoma (circa series 7/image 146). SMA: Patent. Renals: Patent without aneurysm or dissection. Calcified plaque causes mild narrowing at the origins of both renal arteries. IMA: Patent. Inflow: Scattered atherosclerotic plaque causes up to mild narrowing. No aneurysm or dissection. Veins: No  obvious venous abnormality within the limitations of this arterial phase study. Review of the MIP images confirms the above findings. NON-VASCULAR Hepatobiliary: Cholelithiasis. No evidence of cholecystitis. Unremarkable liver. Pancreas: Unremarkable. Spleen: Unremarkable. Adrenals/Urinary Tract: Heterogenous fluid compatible with blood products within the upper left retroperitoneal space extending inferiorly within the pararenal space appears to originate from the left adrenal mass where there is active hemorrhage. The hemorrhage measures approximately 15.0 x 12.6 x 15.9 cm (AP x ML x CC). The borders of the left adrenal mass or obscured by the adjacent hemorrhage however this measures approximately 6.8 x 7.0 cm in the axial plane (7/164). This measured 6.0 by 5.3 cm on MRI 03/03/2023. Unremarkable right adrenal gland. Hemorrhage surrounds the left kidney without mass effect. Bilateral renal cysts which were previously evaluated with MRI 03/03/2023. No follow-up is recommended. No urinary calculi or hydronephrosis. Unremarkable bladder. Stomach/Bowel: Normal caliber large and small bowel. Colonic diverticulosis without diverticulitis. Normal appendix. Stomach is within normal limits. Lymphatic: No lymphadenopathy. Reproductive: Unremarkable. Other: No free intraperitoneal air. Musculoskeletal: Acute fracture or destructive osseous lesion sclerotic lesions in the bilateral iliac wings are favored to represent bone islands. Review of the MIP images confirms the above findings. IMPRESSION: 1. 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 2. Nondisplaced fractures of the left anterior 2nd-7th ribs and right anterior 2nd-6th ribs. No pneumothorax. Trace left pleural effusion. 3. Increased size of the left adrenal mass, previously evaluated with MRI 03/03/2023 Aortic Atherosclerosis (ICD10-I70.0). Critical Value/emergent results were called by telephone at the time of interpretation on 03/15/2023 at 3:16 pm to  provider Jacalyn Lefevre , who verbally acknowledged these results. Electronically Signed   By: Minerva Fester M.D.   On: 03/15/2023 15:24   DG Chest Portable 1 View  Result Date: 03/15/2023 CLINICAL DATA:  intubation confrimation EXAM: PORTABLE CHEST 1 VIEW COMPARISON:  CXR 02/28/23 FINDINGS: Endotracheal tube terminates at the level of the thoracic inlet approximately 4 cm above the carina. Low lung volumes. No pleural effusion. No pneumothorax no focal airspace opacity. Likely unchanged cardiac and mediastinal contours when accounting for differences in lung volumes. Visualized upper abdomen is unremarkable. IMPRESSION: Endotracheal tube terminates at the level of the thoracic inlet. Low lung volumes. Electronically Signed   By: Lorenza Cambridge M.D.   On: 03/15/2023 14:37    Cardiac Studies   Cardiac Studies & Procedures   CARDIAC CATHETERIZATION  CARDIAC CATHETERIZATION 03/01/2023  Narrative   Mid LAD lesion is 30% stenosed.  Diffuse mild to moderate CAD.   LV end diastolic pressure is normal.   There is no aortic valve stenosis.   Tortuous right subclavian requiring 85 cm destination sheath.  Mild to moderate diffuse coronary disease in the major epicardial vessels.  No culprit lesion found.  Patient had a total of 45 minutes of chest pain.  No return of symptoms.  Possibilities include small vessel disease, vasospasm or myocarditis.  Starting heparin 2 hours after TR band removal.  Discussed with the patient's wife, Lanora Manis.  Findings Coronary Findings Diagnostic  Dominance: Co-dominant  Left Anterior Descending There is mild diffuse disease throughout the vessel. Mid LAD lesion is 30% stenosed.  Left Circumflex The vessel exhibits minimal luminal irregularities.  Right Coronary Artery There is mild diffuse disease throughout the vessel.  Intervention  No interventions have been documented.   STRESS TESTS  MYOCARDIAL PERFUSION IMAGING 01/15/2020  Narrative  Nuclear  stress EF: 46%. Visually, the EF appears better than the 46% computer calculation .  There was no ST segment deviation noted during stress.  This is a low risk study. There is no evidence of ischemia or previous infarction.  The study is normal.   ECHOCARDIOGRAM  ECHOCARDIOGRAM COMPLETE 03/01/2023  Narrative ECHOCARDIOGRAM REPORT    Patient Name:   Shane Sampson Date of Exam: 03/01/2023 Medical Rec #:  [id number]     Height:       72.0 in Accession #:    [id number]    Weight:       172.6 lb Date of Birth:  Mar 07, 1956     BSA:          2.001 m Patient Age:    67 years      BP:           132/78 mmHg Patient Gender: M             HR:           93 bpm. Exam Location:  Inpatient  Procedure: 2D Echo, Cardiac Doppler and Color Doppler  Indications:    Acute ischemic heart disease  History:        Patient has prior history of Echocardiogram examinations. Risk Factors:Hypertension.  Sonographer:    Mike Gip Referring Phys: [id number] Donnie Coffin VARANASI  IMPRESSIONS   1. Atypical pattern of abnormal wall motion involving the mid segments of the heart with preserved apical contractility. 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 Cannot exclude myocarditis. Left ventricular ejection fraction, by estimation, is 45 to 50%. The left ventricle has mildly decreased function. The left ventricle demonstrates regional wall  motion abnormalities (see scoring diagram/findings for description). Left ventricular diastolic parameters are consistent with Grade I diastolic dysfunction (impaired relaxation). 2. Right ventricular systolic function is normal. The right ventricular size is normal. Tricuspid regurgitation signal is inadequate for assessing PA pressure. 3. Left atrial size was mildly dilated. 4. The mitral valve is normal in structure. No evidence of mitral valve regurgitation. No evidence of mitral stenosis. 5. The aortic valve is tricuspid. Aortic valve regurgitation is not  visualized. No aortic stenosis is present.  Comparison(s): Prior images reviewed side by side. The left ventricular function is worsened. The left ventricular diastolic function has improved. The left ventricular wall motion abnormalities are new.  FINDINGS Left Ventricle: Atypical pattern of abnormal wall motion involving the mid segments of the heart with preserved apical contractility. 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 Cannot exclude myocarditis. Left ventricular ejection fraction, by estimation, is 45 to 50%. The left ventricle has mildly decreased function. The left ventricle demonstrates regional wall motion abnormalities. The left ventricular internal cavity size was normal in size. There is no left ventricular hypertrophy. Left ventricular diastolic parameters are consistent with Grade I diastolic dysfunction (impaired relaxation). Normal left ventricular filling pressure.   LV Wall Scoring: The mid anteroseptal segment, mid inferolateral segment, mid anterolateral segment, mid inferoseptal segment, mid anterior segment, and mid inferior segment are hypokinetic. The entire apex, basal anteroseptal segment, basal inferolateral segment, basal anterolateral segment, basal anterior segment, basal inferior segment, and basal inferoseptal segment are normal.  Right Ventricle: The right ventricular size is normal. No increase in right ventricular wall thickness. Right ventricular systolic function is normal. Tricuspid regurgitation signal is inadequate for assessing PA pressure.  Left Atrium: Left atrial size was mildly dilated.  Right Atrium: Right atrial size was normal in size.  Pericardium: There is no evidence of pericardial effusion.  Mitral Valve: The mitral valve is normal in structure. No evidence of mitral valve regurgitation. No evidence of mitral valve stenosis.  Tricuspid Valve: The tricuspid valve is normal in structure. Tricuspid valve regurgitation is not  demonstrated.  Aortic Valve: The aortic valve is tricuspid. Aortic valve regurgitation is not visualized. No aortic stenosis is present.  Pulmonic Valve: The pulmonic valve was grossly normal. Pulmonic valve regurgitation is not visualized.  Aorta: The aortic root and ascending aorta are structurally normal, with no evidence of dilitation.  IAS/Shunts: No atrial level shunt detected by color flow Doppler.   LEFT VENTRICLE PLAX 2D LVIDd:         4.60 cm      Diastology LVIDs:         3.30 cm      LV e' medial:    5.77 cm/s LV PW:         1.00 cm      LV E/e' medial:  8.9 LV IVS:        1.10 cm      LV e' lateral:   8.81 cm/s LV E/e' lateral: 5.8  LV Volumes (MOD) LV vol d, MOD A2C: 104.0 ml LV vol d, MOD A4C: 116.0 ml LV vol s, MOD A2C: 51.8 ml LV vol s, MOD A4C: 66.8 ml LV SV MOD A2C:     52.2 ml LV SV MOD A4C:     116.0 ml LV SV MOD BP:      52.6 ml  RIGHT VENTRICLE RV Basal diam:  3.80 cm RV S prime:     12.50 cm/s TAPSE (M-mode): 1.8 cm  LEFT ATRIUM  Index        RIGHT ATRIUM           Index LA diam:        4.20 cm 2.10 cm/m   RA Area:     11.80 cm LA Vol (A2C):   46.1 ml 23.04 ml/m  RA Volume:   21.70 ml  10.84 ml/m LA Vol (A4C):   59.3 ml 29.63 ml/m LA Biplane Vol: 57.0 ml 28.48 ml/m AORTIC VALVE LVOT Vmax:   123.00 cm/s LVOT Vmean:  86.200 cm/s LVOT VTI:    0.208 m  AORTA Ao Asc diam: 3.30 cm  MITRAL VALVE MV Area (PHT): 5.64 cm     SHUNTS MV E velocity: 51.40 cm/s   Systemic VTI: 0.21 m MV A velocity: 109.00 cm/s MV E/A ratio:  0.47  Mihai Croitoru MD Electronically signed by Thurmon Fair MD Signature Date/Time: 03/01/2023/4:28:00 PM    Final     CT SCANS  CT CARDIAC SCORING (SELF PAY ONLY) 12/31/2019  Narrative CLINICAL DATA:  67 year old white male with hyperlipidemia.  EXAM: CT CARDIAC CORONARY ARTERY CALCIUM SCORE  TECHNIQUE: Non-contrast imaging through the heart was performed using prospective ECG gating. Image  post processing was performed on an independent workstation, allowing for quantitative analysis of the heart and coronary arteries. Note that this exam targets the heart and the chest was not imaged in its entirety.  COMPARISON:  None.  FINDINGS: CORONARY CALCIUM SCORES:  Left Main: 48.3  LAD: 401  LCx: 309  RCA: 307  Total Agatston Score: 1065  MESA database percentile: 94  AORTA MEASUREMENTS:  Ascending Aorta: 35 mm  Descending Aorta: 28 mm  OTHER FINDINGS:  Small amount of calcium at the aortic arch and descending thoracic aorta. Normal caliber of the thoracic aorta. Large amount of coronary artery calcium. No significant lymph node enlargement in the visualized mediastinal or hilar regions. Heart size is normal. No significant pericardial fluid. Images of the upper abdomen are unremarkable. No large pleural effusions. 5 mm nodule in the right lower lobe on sequence 9, image 63. Partially calcified nodule in the right middle lobe on sequence 9 image, image 37 measures 5 mm. 4 mm nodule in the right upper lobe on sequence 9, image 6. Subtle nodule in the right middle lobe may be associated with a small fissure on sequence 9, image 30. 2 mm nodule in the lingula on sequence 9, image 48. No acute bone abnormality.  IMPRESSION: 1. Age advanced coronary artery calcium. Coronary calcium score is 1065 and this is at percentile 94 for patients of the same age, gender and ethnicity. 2. Several small pulmonary nodules scattered throughout the visualized lungs. Some of these pulmonary nodules are calcified. These small pulmonary nodules are indeterminate but could represent postinflammatory changes and granulomas. No follow-up needed if patient is low-risk (and has no known or suspected primary neoplasm). Non-contrast chest CT can be considered in 12 months if patient is high-risk. This recommendation follows the consensus statement: Guidelines for Management of  Incidental Pulmonary Nodules Detected on CT Images: From the Fleischner Society 2017; Radiology 2017; 284:228-243. 3.  Aortic Atherosclerosis (ICD10-I70.0).   Electronically Signed By: Richarda Overlie M.D. On: 12/31/2019 10:52   CARDIAC MRI  MR CARDIAC MORPHOLOGY W WO CONTRAST 03/02/2023  Narrative CLINICAL DATA:  55M p/w chest pain, troponin up to 1420. Cath shows nonobstructive CAD. Echo with EF 45-50%, with hypokinesis of mid segments  EXAM: CARDIAC MRI  TECHNIQUE: The patient was scanned on a 1.5  Tesla Siemens magnet. A dedicated cardiac coil was used. Functional imaging was done using Fiesta sequences. 2,3, and 4 chamber views were done to assess for RWMA's. Modified Simpson's rule using a short axis stack was used to calculate an ejection fraction on a dedicated work [employer]. The patient received 10 cc of Gadavist. After 10 minutes inversion recovery sequences were used to assess for infiltration and scar tissue. Phase contrast velocity mapping was performed above the aortic and pulmonic valves  CONTRAST:  10 cc  of Gadavist  FINDINGS: Left ventricle:  -Normal size  -Asymmetric hypertrophy measuring 17mm in basal septum (8mm in posterior wall)  -Moderate systolic dysfunction.  Hypokinesis of mid segments  -Elevated ECV (30%).  Normal T2 values  -RV insertion site LGE. LGE accounts for 2% of total myocardial mass  LV EF: 34% (Normal 49-79%)  Absolute volumes:  LV EDV: (Normal 95-215 mL)  LV ESV: (Normal 25-85 mL)  LV SV: 60mL (Normal 61-145 mL)  CO: 6.7L/min (Normal 3.4-7.8 L/min)  Indexed volumes:  LV EDV: 56mL/sq-m (Normal 50-108 mL/sq-m)  LV ESV: 34mL/sq-m (Normal 11-47 mL/sq-m)  LV SV: 30mL/sq-m (Normal 33-72 mL/sq-m)  CI: 3.4L/min/sq-m (Normal 1.8-4.2 L/min/sq-m)  Right ventricle: Normal size and systolic function  RV EF:  50% (Normal 51-80%)  Absolute volumes:  RV EDV: (Normal 109-217 mL)  RV  ESV: 63mL (Normal 23-91 mL)  RV SV: 63mL (Normal 71-141 mL)  CO: 6.9L/min (Normal 2.8-8.8 L/min)  Indexed volumes:  RV EDV: 70mL/sq-m (Normal 58-109 mL/sq-m)  RV ESV: 23mL/sq-m (Normal 12-46 mL/sq-m)  RV SV: 74mL/sq-m (Normal 38-71 mL/sq-m)  CI: 3.5L/min/sq-m (Normal 1.7-4.2 L/min/sq-m)  Left atrium: Normal size  Right atrium: Normal size  Mitral valve: Trivial regurgitation  Aortic valve: Trivial regurgitation  Tricuspid valve: Trivial regurgitation  Pulmonic valve: Trivial regurgitation  Aorta: Normal proximal ascending aorta  Pericardium: Normal  IMPRESSION: 1. There is hypokinesis of all LV mid ventricular segments, which considering presentation with troponin elevation and normal coronary arteries on cath, suggests mid ventricular Takotsubo cardiomyopathy  2. Asymmetric LV hypertrophy measuring 17mm in basal septum (8mm in posterior wall), meeting criteria for hypertrophic cardiomyopathy  3. RV insertion site LGE, which is commonly seen in HCM. LGE accounts for 2% of total myocardial mass  4.  Normal LV size with moderate systolic dysfunction (EF 34%)  5.  Normal RV size and systolic function (EF 50%)   Electronically Signed By: Epifanio Lesches M.D. On: 03/02/2023 23:36          Patient Profile     67 y.o. 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 Cardiology consulted for labile blood pressures.   Assessment & Plan    #Acute Adrenal Hemorrhage s/p IR Embolization: #Hemorrhagic Shock: -Patient with known left adrenal mass presenting with LUQ pain, nausea and vomiting found to have left adrenal hemorrhage now s/p IR embolization -Initially required pressor support, now with improvement of HD  status  -Continue management per ICU team  #Recent Takostubo CM: -Patient with admission in 02/2023 for NSTEMI with clean coronaries on cath -TTE with LVEF 45-50% with mid-segment WMA most consistent with atypical takostubo pattern -CMR consistent with EF 34% with mid-wall hypokinesis consistent with takostubo -Now presented with hemorrhagic shock as above -Will follow-up repeat TTE -Holding  GDMT in the setting of shock as above   #HD Lability: -Occurred in the setting of left adrenal hemorrhage, hemorrhagic shock and PEA arrest -Overall, BP has stabilized overnight -Continue supportive measures      For questions or updates, please contact [facility] Please consult [email or web address] for contact info under        Signed, Meriam Sprague, MD  03/16/2023, 8:28 AM

## 2023-03-16 NOTE — Progress Notes (Signed)
Pt able to wean on CPAP/PS 8/5 from [phone]. Pt returned to full support due to apneas/ low tidal volumes. RT will continue to monitor and be available as needed.

## 2023-03-16 NOTE — Progress Notes (Signed)
pressures continue to drop w/ pt becoming diaphoretic and vomiting. MD aware and new orders in

## 2023-03-16 NOTE — Plan of Care (Addendum)
PCCM:  8:08 PM Reached out to [facility] regarding pending transfer request (initiated last night, 6/4PM). [facility]) confirmed patient's intake information that I sent over last night; I provided a clinical update and name of family's physician contact Dr. Vivia Ewing (MICU attending). Transfer Center will contact Dr. Melodye Ped regarding transfer and will reach back out to me.  8:52 PM Received return phone call from [facility]. Connected with MICU Fellow, Dr. Jola Schmidt. Provided general clinical synopsis of patient's admission thus far. Dr. Ranae Palms stated that while transfer would be appropriate, no beds are available at present. Dr. Melodye Ped (MICU Attending) will be coming on service tomorrow morning 6/6 and will be fielding Transfer Center calls at that time. Took 36M ICU number [phone]; [facility] will call [facility]M in the morning for clinical updates and to reassess bed situation (ICU versus SDU).  Tim Lair, PA-C [facility] 03/16/23 8:07 PM  Please see [email or web address] for pager details.  From 7A-7P if no response, please call [phone] After hours, please call ELink [phone]

## 2023-03-16 NOTE — Progress Notes (Signed)
Hypoglycemic Event  CBG: 42  Treatment: 8 oz juice/soda  Symptoms: Sweaty w/ nausea   Follow-up CBG: Time:1600 CBG Result:186  Possible Reasons for Event: poor intake  Comments/MD notified: D5 drip     Jolynne Spurgin  Reyna-Nieblas

## 2023-03-16 NOTE — Progress Notes (Signed)
Progress Note: General Surgery Service   Chief Complaint/Subjective: Awake on vent, not complaining of abdominal pain  Objective: Vital signs in last 24 hours: Temp:  [93.6 F (34.2 C)-99.9 F (37.7 C)] 98.8 F (37.1 C) (06/05 0900) Pulse Rate:  [91-138] 118 (06/05 0900) Resp:  [16-44] 21 (06/05 0900) BP: (56-216)/(47-119) 165/101 (06/05 0900) SpO2:  [94 %-100 %] 94 % (06/05 1015) Arterial Line BP: (60-318)/(36-315) 171/91 (06/05 0900) FiO2 (%):  [30 %-100 %] 30 % (06/05 0835) Weight:  [74.4 kg] 74.4 kg (06/04 1243) Last BM Date :  (PTA)  Intake/Output from previous day: 06/04 0701 - 06/05 0700 In: 4901.8 [I.V.:2651.8; IV Piggyback:2250] Out: -  Intake/Output this shift: Total I/O In: 392 [I.V.:315.2; IV Piggyback:76.8] Out: -   Constitutional: NAD; conversant; no deformities Eyes: Moist conjunctiva; no lid lag; anicteric; PERRL Neck: Trachea midline; no thyromegaly Lungs: Normal respiratory effort; no tactile fremitus CV: RRR; no palpable thrills; no pitting edema GI: Abd soft, nontender, some fullness in left flank; no palpable hepatosplenomegaly MSK: Normal range of motion of extremities; no clubbing/cyanosis Psychiatric: Appropriate affect; alert and oriented x3 Lymphatic: No palpable cervical or axillary lymphadenopathy  Lab Results: CBC  Recent Labs    03/15/23 2120 03/16/23 0318  WBC 57.3* 55.8*  HGB 13.6 13.7  HCT 38.7* 38.9*  PLT 282 307   BMET Recent Labs    03/15/23 2030 03/16/23 0318  NA 139 138  K 4.6 4.5  CL 107 104  CO2 22 25  GLUCOSE 166* 170*  BUN 54* 52*  CREATININE 2.43* 2.64*  CALCIUM 7.2* 7.9*   PT/INR Recent Labs    03/15/23 2030 03/16/23 0318  LABPROT 15.9* 14.4  INR 1.3* 1.1   ABG Recent Labs    03/15/23 1552 03/15/23 1942  PHART 7.260* 7.407  HCO3 16.1* 28.7*    Anti-infectives: Anti-infectives (From admission, onward)    None       Medications: Scheduled Meds:  Chlorhexidine Gluconate Cloth  6 each  Topical Daily   insulin aspart  0-15 Units Subcutaneous Q4H   mouth rinse  15 mL Mouth Rinse Q4H   Continuous Infusions:  sodium chloride     PRN Meds:.acetaminophen, mouth rinse  Assessment/Plan:  Shane Sampson is a 67 year old male with left adrenal tumor hemorrhage.  Events yesterday noted - Intubation, CPR, IR embolization, ICU admission with resuscitation Doing well this morning, awake on vent not complaining of abdominal pain No acute surgical intervention recommended, would delay intervention until recovered from this bleeding episode.    LOS: 1 day    Shane Sampson, Shane Sampson  [facility] Use [email or web address] to contact on call provider  Daily Billing: [id number] - Moderate MDM

## 2023-03-16 NOTE — Procedures (Signed)
Extubation Procedure Note  Patient Details:   Name: Shane Sampson DOB: 1956/02/17 MRN: [id number]   Airway Documentation:    Vent end date: 03/16/23 Vent end time: 1015   Evaluation  O2 sats: stable throughout Complications: No apparent complications Patient did tolerate procedure well. Bilateral Breath Sounds: Clear   Yes  Pt extubated per physician order. Pt suctioned via ETT/orally prior, positive cuff leak. Upon extubation pt able to speak name, give a good cough and no stridor heard at this time. Pt placed on 3L nasal cannula. RT will continue to monitor and be available as needed.   Derinda Late 03/16/2023, 10:17 AM

## 2023-03-16 NOTE — Progress Notes (Signed)
MD at bedside and EKG given to MD. EKG in pt file

## 2023-03-16 NOTE — Progress Notes (Signed)
Communicated with MD and NP about families wish for MD  to reach back out to [facility]. NP said communicated yesterday and ICU was full.

## 2023-03-16 NOTE — Progress Notes (Signed)
Echocardiogram 2D Echocardiogram has been performed.  Shane Sampson 03/16/2023, 9:01 AM

## 2023-03-16 NOTE — Progress Notes (Signed)
Patient discussed with Dr. Fredricka Bonine, given recent events she would recommend referral to tertiary center for follow up to discuss left adrenalectomy. Referral made to [facility] and they will contact patient with follow up information. No acute surgery planned, patient needs to recover from acute hemorrhagic shock. General surgery will sign off.   Juliet Rude, [facility] 03/16/2023, 1:33 PM Please see Amion for pager number during day hours 7:00am-4:30pm

## 2023-03-16 NOTE — Progress Notes (Addendum)
NAME:  Shane Sampson, MRN:  [id number], DOB:  04-Jul-1956, LOS: 1 ADMISSION DATE:  03/15/2023, CONSULTATION DATE:  03/15/2023 REFERRING MD:  Dr. 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  Recently hospitalized for chest pain on 5/20-5/24, dx with takotsubo cardiomyopathy and Acute HFmrEF. 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 Discharged with outpatient eval (pending surgery referral and metanephrines).   Per wife, patient has felt sick the last 2 days with really low bps (<90/60s) and HR in 120-130s. K+ 5.7 on 5/31 and instructed by cardiology to adjust GDMT. This morning, started to feel very nauseous with "stomach cramping." Wife stepped out to grab lunch when severe LUQ and flank pain started and EMS was called.   On arrival to ED, RR 25, HR 130, and BP86/69. EKG unremarkable. Initial evaluation pertinent for trop 84, WBC 19.5, glucose 419, Na 134, Cr 2.54, and BUN 57. Pt acutely decompensated with brief episode of PEA. Code Med activated, resulting in CPR and intubation. STAT CTA with active hemorrhage 15 x 12.6 x 15.9 cm in upper left retroperitoneal space arising from left adrenal mass.  General surgery and IR consulted for emergent evaluation. PCCM consulted for admission.  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 Emergent IR left adrenal artery embolization   Interim History / Subjective:  Overnight: Labile blood pressures, poorly responsive to levophed, bolus, and vasopression. Responsive to bicarb. Eventually weaned off pressors. Followed commands with weaned sedation  AM: Extubated, blood pressures stablizing. Wife and family updated at beside.   Objective   Blood pressure 121/85, pulse (!) 116, temperature 98.8 F (37.1 C), resp. rate 20, height 6' (1.829 m), weight 74.4 kg, SpO2 97 %.    Vent Mode: PRVC FiO2 (%):  [40 %-100 %] 40 % Set Rate:  [20 bmp] 20 bmp Vt Set:  [580 mL] 580 mL PEEP:  [5 cmH20] 5 cmH20 Plateau Pressure:  [14 cmH20-18 cmH20] 18 cmH20   Intake/Output Summary (Last 24 hours) at 03/16/2023 0753 Last data filed at 03/16/2023 0600 Gross per 24 hour  Intake 4763.36 ml  Output --  Net 4763.36 ml   Filed Weights   03/15/23 1243  Weight: 74.4 kg    Examination: General: critically ill patient lying in bed, in NAD, able to converse HENT: NCAT, normal conjunctiva, equal pupils, nasal cannula in place Lungs: CTAB, normal WOB Cardiovascular: tachycardia, normal rhythm, no appreciated murmurs, rubs, or gallops, pulses +2 bilaterally Abdomen: soft,non tender, non distended Extremities: no cyanosis, clubbing, or edema. Skin warm and dry without lesions Neuro: Alert. No focal deficits appreciated. Answers questions appropriately, moves extremities spontaneously  Ancillary tests reviewed by me  WBC 55.8 Lactate 1.8 Random cortisol > 100 Cr 2.64, BUN 52 Hg 13.7 Glucose 170 Trop 96 > 126 Pending echo  Assessment & Plan:  Left adrenal mass with hemorrhage s/p emergent left adrenal artery embolization with possible dissection  c/f pheochromocytoma Admitting CT with 15 x 12.6 x 15.9cm hemorrhage in upper left retroperitoneal space arising from left adrenal mass. Emergent left adrenal arteriography showed no  active extravasation or pseudoaneurysm. Particular with exertion done. IR unable to perform coil embolization due to vessel tortuosity, probable dissection of artery during procedure. Now stable.  - Surgery consulted - Obtaining 24h urine metanephrines. Previous normal blood adrenal studies 03/03/23.   Acute hypoxic respiratory failure in setting of cardiac arrest, improving  Multiple rib fractures S/p CPR and epi x1, ROSC achieved on arrival. CT chest with left anterior 2nd-7th rib and right anterior 2nd-6th rib fractures. Successfully extubated this morning. - Nasal cannula as needed; goal spo2 > 90 - Adequate pulmonary hygiene, encourage ICS - Tylenol as needed for rib pain  Hemorrhagic shock with lactic acidosis s/p 4U pRBC in ER  Leukocytosis Now stable, no evidence of obvious hemorrhage. Hgb 13.7. Lactate downtrending. WBC > 50k, secondary to acute stress and steroids. Steroids discontinued now.  - Transfuse again if Hgb < 7 - Higher threshold if suspecting ACS (Hgb <8) - Transfuse regardless Hgb value if active bleeding with hemodynamic instability - Try to transfuse 1U pRBC as possible with exception of active hemorrhage  Labile hypotension and hypertension  HFmrEF  Takotsubo cardiomyopathy  PEA cardiac arrest  Dyslipidemia Had labile Bps after surgery, responded to bicarb. Now off pressors this AM, BP improving. Home regimen PTA: ASA, statin, coreg, jardiance, spironolactone.  - Cardiology consulted; holding GDMT for now - Trending trop - Pending echocardiogram - Discontinue bicarb gtt and hydrocort - MAP goal > 65; pressors as needed - Consider alpha 1 blocker - doxazin if HTN/labile  AKI on CKD III Baseline Cr around 1.5. Arrived with Cr 2.54, secondary to above - Maintenance IV fluids. Encouraging po intake as patient improves. - Monitor BMP - Monitor I/O, foley  Prediabetes A1c 6.3. Glucose 419 on arrival. Home jardiance. - CBGs + SSI  BPH Home tamsulosin -  holding med  Best Practice (right click and "Reselect all SmartList Selections" daily)   Diet/type: NPO DVT prophylaxis: SCD GI prophylaxis: N/A Lines: N/A Foley:  N/A Code Status:  full code Last date of multidisciplinary goals of care discussion [Wife at bedside]  Labs   CBC: Recent Labs  Lab 03/15/23 1251 03/15/23 1552 03/15/23 1646 03/15/23 1942 03/15/23 2030 03/15/23 2120 03/16/23 0318  WBC 19.5*  --   --   --  PENDING 57.3* 55.8*  NEUTROABS 15.4*  --   --   --   --   --   --   HGB 14.6   < > 14.2 12.2* 12.9* 13.6 13.7  HCT 45.6   < > 43.6 36.0* 37.7* 38.7* 38.9*  MCV 93.4  --   --   --  87.3 88.2 88.2  PLT 494*  --   --   --  272 282 307   < > = values in this interval not displayed.     Basic Metabolic Panel: Recent Labs  Lab 03/11/23 1518 03/15/23 1251 03/15/23 1552 03/15/23 1942 03/15/23 2030 03/16/23 0318  NA 138 134* 131* 141 139 138  K 5.7* 4.5 6.5* 5.3* 4.6 4.5  CL 99 102  --   --  107 104  CO2 21 19*  --   --  22 25  GLUCOSE 145* 419*  --   --  166* 170*  BUN 39* 57*  --   --  54* 52*  CREATININE 1.74* 2.54*  --   --  2.43* 2.64*  CALCIUM 10.8* 8.8*  --   --  7.2* 7.9*    GFR: Estimated Creatinine Clearance: 28.2 mL/min (A) (by C-G formula based on SCr of 2.64 mg/dL (H)). Recent Labs  Lab 03/15/23 1251 03/15/23 1646 03/15/23 2030 03/15/23 2120 03/16/23 0318  WBC 19.5*  --  PENDING 57.3* 55.8*  LATICACIDVEN  --  2.5* 2.2*  --  1.8     Liver Function Tests: Recent Labs  Lab 03/15/23 1251 03/15/23 2030 03/16/23 0318  AST 20 15 20   ALT 24 21 21   ALKPHOS 82 54 63  BILITOT 1.7* 2.0* 1.9*  PROT 6.2* 4.2* 4.9*  ALBUMIN 3.4* 2.5* 2.6*    Recent Labs  Lab 03/15/23 1251  LIPASE 48    No results for input(s): "AMMONIA" in the last 168 hours.  ABG    Component Value Date/Time   PHART 7.407 03/15/2023 1942   PCO2ART 45.5 03/15/2023 1942   PO2ART 98 03/15/2023 1942   HCO3 28.7 (H) 03/15/2023 1942   TCO2 30 03/15/2023 1942    ACIDBASEDEF 10.0 (H) 03/15/2023 1552   O2SAT 76.9 03/15/2023 2010     Coagulation Profile: Recent Labs  Lab 03/15/23 2030 03/16/23 0318  INR 1.3* 1.1    Cardiac Enzymes: No results for input(s): "CKTOTAL", "CKMB", "CKMBINDEX", "TROPONINI" in the last 168 hours.  HbA1C: Hgb A1c MFr Bld  Date/Time Value Ref Range Status  03/01/2023 01:24 AM 6.3 (H) 4.8 - 5.6 % Final    Comment:    (NOTE) Pre diabetes:          5.7%-6.4%  Diabetes:              >6.4%  Glycemic control for   <7.0% adults with diabetes     CBG: Recent Labs  Lab 03/15/23 1407 03/15/23 1936 03/15/23 2336 03/16/23 0332 03/16/23 0727  GLUCAP 292* 225* 149* 140* 156*     Review of Systems:   Unable to obtain, critically ill  Past Medical History:  He,  has a past medical history of Hypertension.   Surgical History:   Past Surgical History:  Procedure Laterality Date   HERNIA REPAIR     IR ANGIOGRAM VISCERAL SELECTIVE  03/15/2023   IR EMBO TUMOR ORGAN ISCHEMIA INFARCT INC GUIDE ROADMAPPING  03/15/2023   IR US GUIDE VASC ACCESS RIGHT  03/15/2023   LEFT HEART CATH AND CORONARY ANGIOGRAPHY N/A 03/01/2023   Procedure: LEFT HEART CATH AND CORONARY ANGIOGRAPHY;  Surgeon: Corky Crafts, MD;  Location: [facility] INVASIVE CV LAB;  Service: Cardiovascular;  Laterality: N/A;     Social History:   reports that he has never smoked. He has never used smokeless tobacco. He reports current alcohol use of about 1.0 standard drink of alcohol per week. He reports that he does not use drugs.   Family History:  His family history is not on file.   Allergies No Known Allergies   Home Medications  Prior to Admission medications   Medication Sig Start Date End Date Taking? Authorizing Provider  aspirin 81 MG tablet Take 81 mg by mouth daily.    [provider]  atorvastatin (LIPITOR) 80 MG tablet Take 1 tablet (80 mg total) by mouth daily. 03/05/23   Arty Baumgartner, NP  carvedilol (COREG) 6.25 MG tablet  Take 1 tablet (6.25 mg total) by mouth 2 (two) times daily. 03/11/23   Carlos Levering, NP  empagliflozin (JARDIANCE) 10 MG  TABS tablet Take 1 tablet (10 mg total) by mouth daily. 03/05/23   Arty Baumgartner, NP  ezetimibe (ZETIA) 10 MG tablet Take 10 mg by mouth daily.    [provider]  nitroGLYCERIN (NITROSTAT) 0.4 MG SL tablet Place 1 tablet (0.4 mg total) under the tongue every 5 (five) minutes x 3 doses as needed for chest pain. 03/04/23   Arty Baumgartner, NP  sacubitril-valsartan (ENTRESTO) 24-26 MG Take 1 tablet by mouth 2 (two) times daily. 03/04/23   Arty Baumgartner, NP  spironolactone (ALDACTONE) 25 MG tablet Take 1/2 tablet (12.5 mg total) by mouth daily. 03/05/23   Arty Baumgartner, NP  tamsulosin (FLOMAX) 0.4 MG CAPS capsule Take 0.4 mg by mouth daily.    [provider]     Critical care time    Adolph Pollack, MS4

## 2023-03-16 NOTE — ED Notes (Signed)
Pt blood pressure became hypotensive and nurse went to check on patient has he simultaneously hit the call bell for assistance. Nurse found patient diaphoretic, pale, lying in a pool of sweat. Pt then rolled his eyes back and became unresponsive. Checked for a femoral pulse, patient was in PEA, CPR started immediately.

## 2023-03-17 ENCOUNTER — Inpatient Hospital Stay (HOSPITAL_COMMUNITY): Payer: 59

## 2023-03-17 DIAGNOSIS — N179 Acute kidney failure, unspecified: Secondary | ICD-10-CM

## 2023-03-17 DIAGNOSIS — R578 Other shock: Secondary | ICD-10-CM | POA: Diagnosis not present

## 2023-03-17 DIAGNOSIS — E2749 Other adrenocortical insufficiency: Secondary | ICD-10-CM | POA: Diagnosis not present

## 2023-03-17 LAB — CBC WITH DIFFERENTIAL/PLATELET
Abs Immature Granulocytes: 0 10*3/uL (ref 0.00–0.07)
Basophils Absolute: 0.3 10*3/uL — ABNORMAL HIGH (ref 0.0–0.1)
Basophils Relative: 1 %
Eosinophils Absolute: 0 10*3/uL (ref 0.0–0.5)
Eosinophils Relative: 0 %
HCT: 31.8 % — ABNORMAL LOW (ref 39.0–52.0)
Hemoglobin: 10.8 g/dL — ABNORMAL LOW (ref 13.0–17.0)
Lymphocytes Relative: 6 %
Lymphs Abs: 2 10*3/uL (ref 0.7–4.0)
MCH: 30.2 pg (ref 26.0–34.0)
MCHC: 34 g/dL (ref 30.0–36.0)
MCV: 88.8 fL (ref 80.0–100.0)
Monocytes Absolute: 0.7 10*3/uL (ref 0.1–1.0)
Monocytes Relative: 2 %
Neutro Abs: 29.9 10*3/uL — ABNORMAL HIGH (ref 1.7–7.7)
Neutrophils Relative %: 91 %
Platelets: 284 10*3/uL (ref 150–400)
RBC: 3.58 MIL/uL — ABNORMAL LOW (ref 4.22–5.81)
RDW: 14.6 % (ref 11.5–15.5)
WBC: 32.9 10*3/uL — ABNORMAL HIGH (ref 4.0–10.5)
nRBC: 0 % (ref 0.0–0.2)
nRBC: 0 /100 WBC

## 2023-03-17 LAB — COMPREHENSIVE METABOLIC PANEL
ALT: 21 U/L (ref 0–44)
AST: 25 U/L (ref 15–41)
Albumin: 2.6 g/dL — ABNORMAL LOW (ref 3.5–5.0)
Alkaline Phosphatase: 56 U/L (ref 38–126)
Anion gap: 13 (ref 5–15)
BUN: 67 mg/dL — ABNORMAL HIGH (ref 8–23)
CO2: 23 mmol/L (ref 22–32)
Calcium: 8.1 mg/dL — ABNORMAL LOW (ref 8.9–10.3)
Chloride: 98 mmol/L (ref 98–111)
Creatinine, Ser: 3.75 mg/dL — ABNORMAL HIGH (ref 0.61–1.24)
GFR, Estimated: 17 mL/min — ABNORMAL LOW (ref >60)
Glucose, Bld: 149 mg/dL — ABNORMAL HIGH (ref 70–99)
Potassium: 4.7 mmol/L (ref 3.5–5.1)
Sodium: 134 mmol/L — ABNORMAL LOW (ref 135–145)
Total Bilirubin: 1.5 mg/dL — ABNORMAL HIGH (ref 0.3–1.2)
Total Protein: 5.1 g/dL — ABNORMAL LOW (ref 6.5–8.1)

## 2023-03-17 LAB — MAGNESIUM: Magnesium: 2 mg/dL (ref 1.7–2.4)

## 2023-03-17 LAB — GLUCOSE, CAPILLARY
Glucose-Capillary: 142 mg/dL — ABNORMAL HIGH (ref 70–99)
Glucose-Capillary: 136 mg/dL — ABNORMAL HIGH (ref 70–99)
Glucose-Capillary: 130 mg/dL — ABNORMAL HIGH (ref 70–99)
Glucose-Capillary: 125 mg/dL — ABNORMAL HIGH (ref 70–99)
Glucose-Capillary: 135 mg/dL — ABNORMAL HIGH (ref 70–99)

## 2023-03-17 LAB — PHOSPHORUS: Phosphorus: 9.3 mg/dL — ABNORMAL HIGH (ref 2.5–4.6)

## 2023-03-17 MED ORDER — OXYCODONE HCL 5 MG PO TABS
5.0000 mg | ORAL_TABLET | ORAL | Status: DC | PRN
  Administered 2023-03-17 (×4): 5 mg via ORAL
  Filled 2023-03-17 (×4): qty 1

## 2023-03-17 MED ORDER — SENNOSIDES-DOCUSATE SODIUM 8.6-50 MG PO TABS: 1.0000 | ORAL_TABLET | Freq: Two times a day (BID) | ORAL | Status: DC

## 2023-03-17 MED ORDER — ONDANSETRON HCL 4 MG/2ML IJ SOLN
4.0000 mg | INTRAMUSCULAR | 0 refills | Status: DC | PRN
  Filled 2023-03-17: qty 2, 1d supply, fill #0

## 2023-03-17 MED ORDER — HYDROMORPHONE HCL 1 MG/ML IJ SOLN
0.5000 mg | INTRAMUSCULAR | Status: DC | PRN
  Administered 2023-03-17 – 2023-03-18 (×5): 1 mg via INTRAVENOUS
  Filled 2023-03-17 (×5): qty 1

## 2023-03-17 MED ORDER — ACETAMINOPHEN 500 MG PO TABS
1000.0000 mg | ORAL_TABLET | Freq: Four times a day (QID) | ORAL | Status: DC
  Administered 2023-03-17 (×3): 1000 mg via ORAL
  Filled 2023-03-17 (×3): qty 2

## 2023-03-17 MED ORDER — TAMSULOSIN HCL 0.4 MG PO CAPS
0.4000 mg | ORAL_CAPSULE | Freq: Every day | ORAL | Status: DC
  Administered 2023-03-17: 0.4 mg via ORAL
  Filled 2023-03-17: qty 1

## 2023-03-17 MED ORDER — INSULIN ASPART 100 UNIT/ML IJ SOLN
0.0000 [IU] | Freq: Every day | INTRAMUSCULAR | Status: DC
  Administered 2023-03-17: 0 [IU] via SUBCUTANEOUS

## 2023-03-17 MED ORDER — CARVEDILOL 3.125 MG PO TABS: 3.1250 mg | ORAL_TABLET | Freq: Two times a day (BID) | ORAL | Status: DC

## 2023-03-17 MED ORDER — ORAL CARE MOUTH RINSE: 15.0000 mL | OROMUCOSAL | Status: DC | PRN

## 2023-03-17 MED ORDER — INSULIN ASPART 100 UNIT/ML IJ SOLN
0.0000 [IU] | Freq: Three times a day (TID) | INTRAMUSCULAR | 11 refills | Status: DC
  Filled 2023-03-17: qty 10, 37d supply, fill #0

## 2023-03-17 MED ORDER — SENNOSIDES-DOCUSATE SODIUM 8.6-50 MG PO TABS
1.0000 | ORAL_TABLET | Freq: Two times a day (BID) | ORAL | Status: DC
  Administered 2023-03-17 (×2): 1 via ORAL
  Filled 2023-03-17 (×2): qty 1

## 2023-03-17 MED ORDER — LIDOCAINE 5 % EX PTCH
2.0000 | MEDICATED_PATCH | Freq: Every day | CUTANEOUS | 0 refills | Status: DC
  Filled 2023-03-17: qty 30, 15d supply, fill #0

## 2023-03-17 MED ORDER — ORAL CARE MOUTH RINSE: 15.0000 mL | OROMUCOSAL | 0 refills | Status: DC | PRN

## 2023-03-17 MED ORDER — INSULIN ASPART 100 UNIT/ML IJ SOLN
0.0000 [IU] | Freq: Three times a day (TID) | INTRAMUSCULAR | Status: DC
  Administered 2023-03-17 (×2): 1 [IU] via SUBCUTANEOUS

## 2023-03-17 MED ORDER — METHOCARBAMOL 500 MG PO TABS
1000.0000 mg | ORAL_TABLET | Freq: Three times a day (TID) | ORAL | Status: DC
  Administered 2023-03-17 (×3): 1000 mg via ORAL
  Filled 2023-03-17 (×3): qty 2

## 2023-03-17 MED ORDER — CHLORHEXIDINE GLUCONATE CLOTH 2 % EX PADS: 6.0000 | MEDICATED_PAD | Freq: Every day | CUTANEOUS | Status: DC

## 2023-03-17 MED ORDER — ACETAMINOPHEN 500 MG PO TABS
1000.0000 mg | ORAL_TABLET | Freq: Four times a day (QID) | ORAL | 0 refills | Status: AC
  Filled 2023-03-17: qty 30, 4d supply, fill #0

## 2023-03-17 MED ORDER — LIDOCAINE 5 % EX PTCH
2.0000 | MEDICATED_PATCH | Freq: Every day | CUTANEOUS | Status: DC
  Administered 2023-03-17: 2 via TRANSDERMAL
  Filled 2023-03-17: qty 2

## 2023-03-17 MED ORDER — METHOCARBAMOL 1000 MG PO TABS: 1000.0000 mg | ORAL_TABLET | Freq: Three times a day (TID) | ORAL | Status: DC

## 2023-03-17 MED ORDER — CARVEDILOL 3.125 MG PO TABS
3.1250 mg | ORAL_TABLET | Freq: Two times a day (BID) | ORAL | Status: DC
  Administered 2023-03-17: 3.125 mg via ORAL
  Filled 2023-03-17 (×2): qty 1

## 2023-03-17 MED ORDER — LIDOCAINE 5 % EX PTCH: 1.0000 | MEDICATED_PATCH | Freq: Every day | CUTANEOUS | Status: DC

## 2023-03-17 MED ORDER — INSULIN ASPART 100 UNIT/ML IJ SOLN
0.0000 [IU] | Freq: Every day | INTRAMUSCULAR | 11 refills | Status: DC
  Filled 2023-03-17: qty 10, 200d supply, fill #0

## 2023-03-17 NOTE — Progress Notes (Signed)
NAME:  Shane Sampson, MRN:  [id number], DOB:  12/17/1955, LOS: 2 ADMISSION DATE:  03/15/2023, CONSULTATION DATE:  03/15/2023 REFERRING MD:  Dr. 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  Recently hospitalized for chest pain on 5/20-5/24, dx with takotsubo cardiomyopathy and Acute HFmrEF. 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 Discharged with outpatient eval (pending surgery referral and metanephrines).   Per wife, patient has felt sick the last 2 days with really low bps (<90/60s) and HR in 120-130s. K+ 5.7 on 5/31 and instructed by cardiology to adjust GDMT. This morning, started to feel very nauseous with "stomach cramping." Wife stepped out to grab lunch when severe LUQ and flank pain started and EMS was called.   On arrival to ED, RR 25, HR 130, and BP86/69. EKG unremarkable. Initial evaluation pertinent for trop 84, WBC 19.5, glucose 419, Na 134, Cr 2.54, and BUN 57. Pt acutely decompensated with brief episode of PEA. Code Med activated, resulting in CPR and intubation. STAT CTA with active hemorrhage 15 x 12.6 x 15.9 cm in upper left retroperitoneal space arising from left adrenal mass.  General surgery and IR consulted for emergent evaluation. PCCM consulted for admission.  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 Emergent IR left adrenal artery embolization   Interim History / Subjective:  Overnight: Sub-optimal pain control, given morphine + oxycodone. Attempt to transfer for [facility] but no bed available.   AM: Endorsing upper abdominal and chest pain, just received oxycodone. Nauseous. Requiring 4L via [place]. Awaiting call from [facility] Transfer.   Objective   Blood pressure 133/73, pulse (!) 104, temperature 98.6 F (37 C), resp. rate 19, height 6' (1.829 m), weight 74.4 kg, SpO2 91 %.        Intake/Output Summary (Last 24 hours) at 03/17/2023 0925 Last data filed at 03/17/2023 0900 Gross per 24 hour  Intake 1679.78 ml  Output 225 ml  Net 1454.78 ml    Filed Weights   03/15/23 1243  Weight: 74.4 kg    Examination: General: critically ill patient lying in bed, uncomfortable, able to converse HENT: NCAT, normal conjunctiva, equal pupils, nasal cannula in place Lungs: shallow breaths from pain, tachypnea, diminished lung sounds without wheezing Cardiovascular: tachycardia, normal rhythm, no appreciated murmurs, rubs, or gallops, pulses +2 bilaterally Abdomen: soft, non distended Extremities: no cyanosis, clubbing, or edema. Skin warm and dry without lesions Neuro: A&Ox3, no focal deficits  Ancillary tests reviewed by me  Na 134, glucose 149, Cr 3.75, Phosphorus 9.3, Ca 8.1, albumin 2.6 WBC 32.9, Hg 10.8 CT ab/pelv with no changes in mass, decreased hematoma, 1.7cm fluid collection adjacent to right common femoral artery  Assessment & Plan:   Left adrenal mass with hemorrhage s/p emergent left adrenal artery embolization with possible dissection  c/f pheochromocytoma Now stable s/p emergent  left adrenal arteriography. Particular with exertion done. IR unable to perform coil embolization due to vessel tortuosity, possible dissection of artery during procedure. Repeat CT 6/6 with no acute changes, decreased hematoma.  - Referral to  [facility], pending transfer. Eventually will need adrenalectomy when more stable - Awaiting 24h urine metanephrines - Will call lab to assess prior plasma metanephrines (collected 5/21).  Acute hypoxic respiratory failure in setting of cardiac arrest  Multiple rib fractures S/p CPR and epi x1, ROSC achieved on arrival. CT chest with left anterior 2nd-7th rib and right anterior 2nd-6th rib fractures. Worsening pain throughout the night with reassuring STAT CT. Required dilaudid and morphine.  - Nasal cannula as needed; goal spo2 > 90 - Adequate pulmonary hygiene, encourage ICS - Multimodal pain regimen:  - Sch tylenol, robaxin, lidocaine patches  - oxycodone 5mg  q4h prn and IV dilaudid q4h prn for moderate to severe - PT/ OT eval  Hemorrhagic shock with lactic acidosis s/p 4U pRBC in ER, improved  Now stable, no evidence of obvious hemorrhage. Hgb 10.8. Lactate downtrending. - Transfuse again if Hgb < 7 - Higher threshold if suspecting ACS (Hgb <8) - Transfuse regardless Hgb value if active bleeding with hemodynamic instability - Try to transfuse 1U pRBC as possible with exception of active hemorrhage  AKI on CKD III 2/2 ATN  Hyperphosphatemia Worsening Cr to 3.75. Baseline Cr around 1.5. Secondary to shock and IV contrast. Phos 9.3 this AM. - Maintenance IV fluids - NS 75 mL/hr. - Monitor BMP - Monitor I/O, foley  Labile hypotension and hypertension  Had labile Bps after surgery, now improved and not requiring pressors. Mildly hypertensive this AM. - RESUME home coreg at lower dose - 3.125mg  BID - MAP goal > 65  HFmrEF  Takotsubo cardiomyopathy  PEA cardiac arrest  Dyslipidemia TTE with LVEF 45-50% in 02/2023. Home regimen PTA: ASA, statin, coreg, jardiance, spironolactone.  - TTE once more comfortable and able to lie on left side, failed attempt 6/5. - Holding GDMT for now  Leukocytosis, improving  WBC > 50 yesterday, now down trending. Likely secondary to acute stress and  steroids now discontinued - Continue to monitor  Hypoglycemia, resolved  Prediabetes A1c 6.3. Home jardiance. Hypoglycemic yesterday, responded well to juice and gentle D5W. - CBGs + SSI - Goal glucose 140-180 - Hypoglycemia protocol   Nausea Likely exacerbated by pain and stress. - zofran prn  BPH - RESUME home tamsulosin  Best Practice (right click and "Reselect all SmartList Selections" daily)   Diet/type: NPO DVT prophylaxis: SCD GI prophylaxis: N/A Lines: N/A Foley:  N/A Code Status:  full code Last date of multidisciplinary goals of care discussion [Wife at bedside]  Labs   CBC: Recent Labs  Lab 03/15/23 1251 03/15/23 1552 03/15/23 2030 03/15/23 2120 03/16/23 0318 03/16/23 1827 03/17/23 0300  WBC 19.5*  --  52.3* 57.3* 55.8* 37.2* 32.9*  NEUTROABS 15.4*  --   --   --   --   --  29.9*  HGB 14.6   < > 12.9* 13.6 13.7 11.9* 10.8*  HCT 45.6   < > 37.7* 38.7* 38.9* 34.7* 31.8*  MCV 93.4  --  87.3 88.2 88.2 88.1 88.8  PLT 494*  --  272 282 307 285 284   < > = values in this interval not displayed.     Basic Metabolic Panel: Recent Labs  Lab 03/15/23 1251 03/15/23 1552 03/15/23 1942 03/15/23 2030 03/16/23 0318 03/16/23 1827 03/17/23 0300  NA 134*   < >  141 139 138 135 134*  K 4.5   < > 5.3* 4.6 4.5 4.7 4.7  CL 102  --   --  107 104 98 98  CO2 19*  --   --  22 25 23 23   GLUCOSE 419*  --   --  166* 170* 177* 149*  BUN 57*  --   --  54* 52* 58* 67*  CREATININE 2.54*  --   --  2.43* 2.64* 3.02* 3.75*  CALCIUM 8.8*  --   --  7.2* 7.9* 8.4* 8.1*  MG  --   --   --   --   --   --  2.0  PHOS  --   --   --   --   --   --  9.3*   < > = values in this interval not displayed.    GFR: Estimated Creatinine Clearance: 19.8 mL/min (A) (by C-G formula based on SCr of 3.75 mg/dL (H)). Recent Labs  Lab 03/15/23 1646 03/15/23 2030 03/15/23 2120 03/16/23 0318 03/16/23 1827 03/17/23 0300  WBC  --  52.3* 57.3* 55.8* 37.2* 32.9*  LATICACIDVEN 2.5* 2.2*  --  1.8   --   --      Liver Function Tests: Recent Labs  Lab 03/15/23 1251 03/15/23 2030 03/16/23 0318 03/16/23 1827 03/17/23 0300  AST 20 15 20 26 25   ALT 24 21 21 21 21   ALKPHOS 82 54 63 61 56  BILITOT 1.7* 2.0* 1.9* 1.7* 1.5*  PROT 6.2* 4.2* 4.9* 4.9* 5.1*  ALBUMIN 3.4* 2.5* 2.6* 2.6* 2.6*    Recent Labs  Lab 03/15/23 1251  LIPASE 48    No results for input(s): "AMMONIA" in the last 168 hours.  ABG    Component Value Date/Time   PHART 7.407 03/15/2023 1942   PCO2ART 45.5 03/15/2023 1942   PO2ART 98 03/15/2023 1942   HCO3 28.7 (H) 03/15/2023 1942   TCO2 30 03/15/2023 1942   ACIDBASEDEF 10.0 (H) 03/15/2023 1552   O2SAT 76.9 03/15/2023 2010     Coagulation Profile: Recent Labs  Lab 03/15/23 2030 03/16/23 0318  INR 1.3* 1.1     Cardiac Enzymes: No results for input(s): "CKTOTAL", "CKMB", "CKMBINDEX", "TROPONINI" in the last 168 hours.  HbA1C: Hgb A1c MFr Bld  Date/Time Value Ref Range Status  03/01/2023 01:24 AM 6.3 (H) 4.8 - 5.6 % Final    Comment:    (NOTE) Pre diabetes:          5.7%-6.4%  Diabetes:              >6.4%  Glycemic control for   <7.0% adults with diabetes     CBG: Recent Labs  Lab 03/16/23 1622 03/16/23 1954 03/16/23 2314 03/17/23 0302 03/17/23 0740  GLUCAP 186* 147* 153* 142* 136*     Review of Systems:   Unable to obtain, critically ill  Past Medical History:  He,  has a past medical history of Hypertension.   Surgical History:   Past Surgical History:  Procedure Laterality Date   HERNIA REPAIR     IR ANGIOGRAM VISCERAL SELECTIVE  03/15/2023   IR EMBO TUMOR ORGAN ISCHEMIA INFARCT INC GUIDE ROADMAPPING  03/15/2023   IR US GUIDE VASC ACCESS RIGHT  03/15/2023   LEFT HEART CATH AND CORONARY ANGIOGRAPHY N/A 03/01/2023   Procedure: LEFT HEART CATH AND CORONARY ANGIOGRAPHY;  Surgeon: Corky Crafts, MD;  Location: [facility] INVASIVE CV LAB;  Service: Cardiovascular;  Laterality: N/A;   RADIOLOGY WITH  ANESTHESIA N/A 03/15/2023    Procedure: IR WITH ANESTHESIA;  Surgeon: Radiologist, Medication, MD;  Location: [facility] OR;  Service: Radiology;  Laterality: N/A;     Social History:   reports that he has never smoked. He has never used smokeless tobacco. He reports current alcohol use of about 1.0 standard drink of alcohol per week. He reports that he does not use drugs.   Family History:  His family history is not on file.   Allergies No Known Allergies   Home Medications  Prior to Admission medications   Medication Sig Start Date End Date Taking? Authorizing Provider  aspirin 81 MG tablet Take 81 mg by mouth daily.    [provider]  atorvastatin (LIPITOR) 80 MG tablet Take 1 tablet (80 mg total) by mouth daily. 03/05/23   Arty Baumgartner, NP  carvedilol (COREG) 6.25 MG tablet Take 1 tablet (6.25 mg total) by mouth 2 (two) times daily. 03/11/23   Carlos Levering, NP  empagliflozin (JARDIANCE) 10 MG TABS tablet Take 1 tablet (10 mg total) by mouth daily. 03/05/23   Arty Baumgartner, NP  ezetimibe (ZETIA) 10 MG tablet Take 10 mg by mouth daily.    [provider]  nitroGLYCERIN (NITROSTAT) 0.4 MG SL tablet Place 1 tablet (0.4 mg total) under the tongue every 5 (five) minutes x 3 doses as needed for chest pain. 03/04/23   Arty Baumgartner, NP  sacubitril-valsartan (ENTRESTO) 24-26 MG Take 1 tablet by mouth 2 (two) times daily. 03/04/23   Arty Baumgartner, NP  spironolactone (ALDACTONE) 25 MG tablet Take 1/2 tablet (12.5 mg total) by mouth daily. 03/05/23   Arty Baumgartner, NP  tamsulosin (FLOMAX) 0.4 MG CAPS capsule Take 0.4 mg by mouth daily.    [provider]     Critical care time    Adolph Pollack, MS4

## 2023-03-17 NOTE — Progress Notes (Signed)
Rounding Note    Patient Name: Shane Sampson Date of Encounter: 03/17/2023  [facility] Cardiologist: Little Ishikawa, MD   Subjective   Extubated yesterday. Now on [place]. Complains of LUQ pain today.   Inpatient Medications    Scheduled Meds:  acetaminophen  1,000 mg Oral Q6H   carvedilol  3.125 mg Oral BID WC   Chlorhexidine Gluconate Cloth  6 each Topical Daily   insulin aspart  0-5 Units Subcutaneous QHS   insulin aspart  0-9 Units Subcutaneous TID WC   lidocaine  2 patch Transdermal Daily   methocarbamol  1,000 mg Oral TID   tamsulosin  0.4 mg Oral Daily   Continuous Infusions:  sodium chloride 75 mL/hr at 03/17/23 0900   PRN Meds: HYDROmorphone (DILAUDID) injection, ondansetron (ZOFRAN) IV, mouth rinse, oxyCODONE   Vital Signs    Vitals:   03/17/23 0742 03/17/23 0800 03/17/23 0815 03/17/23 0900  BP:   133/73   Pulse:  (!) 103 (!) 106 (!) 104  Resp:  (!) 25 (!) 25 19  Temp: 98.6 F (37 C) 98.6 F (37 C) 98.6 F (37 C) 98.6 F (37 C)  TempSrc: Bladder     SpO2:  92% 91% 91%  Weight:      Height:        Intake/Output Summary (Last 24 hours) at 03/17/2023 0947 Last data filed at 03/17/2023 0900 Gross per 24 hour  Intake 1679.78 ml  Output 225 ml  Net 1454.78 ml       03/15/2023   12:43 PM 03/11/2023    2:23 PM 03/04/2023    4:07 AM  Last 3 Weights  Weight (lbs) 164 lb 166 lb 12.8 oz 164 lb  Weight (kg) 74.39 kg 75.66 kg 74.39 kg      Telemetry    Sinus tachycardia 100-110s - Personally Reviewed  ECG    No new tracing- Personally Reviewed  Physical Exam   GEN: Awake, alert, mildly uncomfortable appearing Neck: No JVD Cardiac: Tachycardic, regular, no murmurs Respiratory: CTAB GI: Soft, nontender, non-distended  MS: No edema; No deformity. Neuro:  AAOx3, grossly nonfocal Psych: Normal affect   Labs    High Sensitivity Troponin:   Recent Labs  Lab 03/15/23 1251 03/15/23 1646 03/15/23 2030 03/16/23 0724 03/16/23 1827   TROPONINIHS 84* 96* 126* 105* 83*      Chemistry Recent Labs  Lab 03/16/23 0318 03/16/23 1827 03/17/23 0300  NA 138 135 134*  K 4.5 4.7 4.7  CL 104 98 98  CO2 25 23 23   GLUCOSE 170* 177* 149*  BUN 52* 58* 67*  CREATININE 2.64* 3.02* 3.75*  CALCIUM 7.9* 8.4* 8.1*  MG  --   --  2.0  PROT 4.9* 4.9* 5.1*  ALBUMIN 2.6* 2.6* 2.6*  AST 20 26 25   ALT 21 21 21   ALKPHOS 63 61 56  BILITOT 1.9* 1.7* 1.5*  GFRNONAA 26* 22* 17*  ANIONGAP 9 14 13      Lipids  Recent Labs  Lab 03/16/23 0318  TRIG 69     Hematology Recent Labs  Lab 03/16/23 0318 03/16/23 1827 03/17/23 0300  WBC 55.8* 37.2* 32.9*  RBC 4.41 3.94* 3.58*  HGB 13.7 11.9* 10.8*  HCT 38.9* 34.7* 31.8*  MCV 88.2 88.1 88.8  MCH 31.1 30.2 30.2  MCHC 35.2 34.3 34.0  RDW 14.4 14.6 14.6  PLT 307 285 284    Thyroid No results for input(s): "TSH", "FREET4" in the last 168 hours.  BNPNo results  for input(s): "BNP", "PROBNP" in the last 168 hours.  DDimer No results for input(s): "DDIMER" in the last 168 hours.   Radiology    CT ABDOMEN PELVIS WO CONTRAST  Result Date: 03/17/2023 CLINICAL DATA:  Acute nonlocalized abdominal pain EXAM: CT ABDOMEN AND PELVIS WITHOUT CONTRAST TECHNIQUE: Multidetector CT imaging of the abdomen and pelvis was performed following the standard protocol without IV contrast. RADIATION DOSE REDUCTION: This exam was performed according to the departmental dose-optimization program which includes automated exposure control, adjustment of the mA and/or kV according to patient size and/or use of iterative reconstruction technique. COMPARISON:  CTA chest abdomen and pelvis 03/15/2023 FINDINGS: Lower chest: Increased small bilateral pleural effusions and associated atelectasis since 03/15/2023. Pneumonia is difficult to exclude. Hepatobiliary: Unremarkable liver. Vicarious excretion of contrast in the gallbladder. No biliary dilation. Pancreas: Unremarkable. Spleen: Unremarkable. 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 Is not substantially changed in size from 03/15/2023. Heterogenous hyperdense fluid in the left retroperitoneum has slightly decreased from 03/15/2023 compatible with decreasing hematoma. Normal right adrenal gland. No urinary calculi or hydronephrosis. Foley catheter in the nondistended bladder Stomach/Bowel: Normal caliber large and small bowel. No bowel wall thickening. Large stool burden in the cecum. Stomach is within normal limits. Vascular/Lymphatic: Advanced aortic atherosclerotic calcification. Small 1.7 cm fluid collection adjacent to the right common femoral artery likely related to access for embolization performed 03/15/2023. If there is concern for pseudoaneurysm, ultrasound is recommended. Reproductive: Unremarkable. Other: Left retroperitoneal hematoma extending inferiorly in the anterior pararenal space into the pelvis. There is increased blood products in the pelvis likely due to redistribution. Increased fluid about the liver also likely due to redistribution. No free intraperitoneal air. Musculoskeletal: No acute osseous abnormality. IMPRESSION: 1. Increased small bilateral pleural effusions and associated atelectasis since 03/15/2023. Pneumonia is difficult to exclude. 2. No significant change in size of the 7.0 cm left adrenal mass. 3. Left retroperitoneal hematoma extending inferiorly in the anterior pararenal space into the pelvis has slightly decreased from 03/15/2023. There is increased blood products in the pelvis likely due to redistribution. 4. Small 1.7 cm fluid collection adjacent to the right common femoral artery likely related to access for embolization performed 03/15/2023. If there is concern for pseudoaneurysm, ultrasound is recommended. Aortic Atherosclerosis (ICD10-I70.0). Electronically Signed   By: Minerva Fester M.D.   On: 03/17/2023 02:55   ECHOCARDIOGRAM COMPLETE  Result Date: 03/16/2023    ECHOCARDIOGRAM  REPORT   Patient Name:   ELLSWORTH CHARLEBOIS Date of Exam: 03/16/2023 Medical Rec #:  [id number]     Height:       72.0 in Accession #:    [id number]    Weight:       164.0 lb Date of Birth:  06-15-1956     BSA:          1.958 m Patient Age:    67 years      BP:           121/85 mmHg Patient Gender: M             HR:           120 bpm. Exam Location:  Inpatient Procedure: Limited Echo Indications:    [place] respiratory distress R06.03,                 Cardiomyopathy-Unspecified I42.9  History:        Patient has prior history of Echocardiogram examinations, most  recent 03/01/2023. Previous Myocardial Infarction; Risk                 Factors:Hypertension and Dyslipidemia. Takotsubo syndrome, CKD                 stage 3.  Sonographer:    Lucendia Herrlich Sonographer#2:  Irving Burton Senior Referring Phys: Reche Dixon MURALI RAMASWAMY IMPRESSIONS  1. Endocardial segments not visualized so cannot comment on EF or wall motion.  2. Right ventricular systolic function was not well visualized. The right ventricular size is not well visualized.  3. The mitral valve was not well visualized. Cannot assess for mitral regurgistation due to poor acoustical windows.  4. The aortic valve was not well visualized. Cannot assess for AI due to poor images.  5. Pulmonic valve regurgitation Cannot assess for PR due to poor windows.  6. Consider TEE or cardiac MRI FINDINGS  Left Ventricle: Endocardial segments not visualized so cannot comment on EF or wall motion. Left ventricular diastolic function could not be evaluated. Right Ventricle: The right ventricular size is not well visualized. Right vetricular wall thickness was not assessed. . Left Atrium: Left atrial size was not well visualized. Right Atrium: Right atrial size was not well visualized. Pericardium: The pericardium was not well visualized. Mitral Valve: The mitral valve was not well visualized. Cannot assess due to poor acoustical windows mitral valve regurgitation. Tricuspid Valve:  The tricuspid valve is not well visualized. Cannot assess for TR due to images  Aortic Valve: The aortic valve was not well visualized. Cannot assess for AI due to poor images. Pulmonic Valve: The pulmonic valve was not assessed. Cannot assess for PR due to poor windows. Aorta: Aortic root could not be assessed. Venous: The inferior vena cava was not well visualized. IAS/Shunts: The interatrial septum was not assessed. 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 The patient presents for emergent arteriography and embolization. EXAM: 1. ULTRASOUND GUIDANCE FOR VASCULAR ACCESS OF THE RIGHT COMMON FEMORAL ARTERY 2. SELECTIVE ARTERIOGRAPHY OF THE LEFT ADRENAL ARTERY (FIRST ORDER OFF OF ABDOMINAL AORTA) 3. PARTICLE EMBOLIZATION OF THE LEFT ADRENAL ARTERY FOR TUMOR ISCHEMIA MEDICATIONS: No additional medications. ANESTHESIA/SEDATION: The patient was intubated during the procedure and monitored by the Department of Anesthesia. CONTRAST:  50 mL Visipaque 320 FLUOROSCOPY TIME:  Radiation Exposure Index (as provided by the fluoroscopic device): 458 mGy Kerma COMPLICATIONS: None immediate. PROCEDURE: Informed consent was obtained from the patient's wife following explanation of the procedure, risks, benefits and alternatives. The patient's wife understands, agrees and consents for the procedure. All questions were addressed. A time out was performed prior to the initiation of the procedure. Maximal barrier sterile technique utilized including caps, mask, sterile gowns, sterile gloves, large sterile drape, hand hygiene, and chlorhexidine prep. Ultrasound was performed to confirm patency of the right common femoral artery. An ultrasound image was saved and recorded. Under  ultrasound guidance, access of the artery was accomplished with a micropuncture set. A 5 French vascular sheath was then advanced into the artery over a guidewire. A 5 French Cobra catheter was advanced into the abdominal aorta. This was then used to selectively catheterize the origin of the left adrenal artery. Selective arteriography was performed. A microcatheter was then advanced through the 5 French catheter into the proximal  aspect of the left adrenal artery. Additional selective arteriography was performed. The catheter was then further advanced into the left adrenal artery and further arteriography performed. Particle embolization was performed in the left renal artery with 300-500 micron sized Embosphere particles. Particles were slowly infused by hand injection in diluted contrast. Ultimately, 3/4 vial of particles were instilled. Additional arteriography was performed through the microcatheter. Different micro catheters were utilized in attempting further catheter advancement over guidewires to allow placement of embolization coils into the left adrenal artery. Ultimately microcatheter and 5 French catheter were removed. Hemostasis was obtained at the femoral access site utilizing the Angio-Seal device. FINDINGS: Selective left adrenal arteriography demonstrates abnormal vascularity supplying the known mass of the left adrenal gland with a rounded region of hypervascular enhancement noted. With arteriography, there was no active extravasation of contrast noted or discrete pseudoaneurysms. The left adrenal artery was quite tortuous after takeoff and it was difficult advancing a microcatheter much beyond its initial superiorly oriented segment. Particle embolization was successful in occluding smaller branches supplying tumor and slowing flow significantly in the main branches of the left adrenal artery. Microcatheter purchase could not be accomplished beyond the proximal segment to allow safe placement of  embolization coils with inability to advance a catheter over a guidewire. Catheter arteriography demonstrates dissection of the proximal vessel likely from catheter and guidewire manipulation which was the likely cause of inability to advance the microcatheter. For this reason, proximal coil embolization of the left renal artery was not performed. This does allow for potential future repeat embolization of the left adrenal artery once the dissection heals, should the patient require future embolization such as preoperative embolization. IMPRESSION: Technically successful microsphere particle embolization of left adrenal arterial supply to a bleeding left adrenal mass. Particle embolization was performed with 3/4 of a vial of 300-500 micron sized Embosphere particles. Proximal coil embolization could not be performed due to inability to advance the microcatheter sufficiently. Arteriography did demonstrate proximal dissection of the left adrenal artery during the procedure which was likely the cause of inability to advance the catheter. Lack of use of embolization coils does allow for potential future embolization of the left adrenal artery once the dissection heals. Electronically Signed   By: Irish Lack M.D.   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 The patient presents for emergent arteriography and embolization. EXAM: 1. ULTRASOUND GUIDANCE FOR VASCULAR ACCESS OF THE RIGHT COMMON FEMORAL ARTERY 2. SELECTIVE ARTERIOGRAPHY OF THE LEFT ADRENAL ARTERY (FIRST ORDER OFF OF ABDOMINAL AORTA) 3. PARTICLE EMBOLIZATION OF THE LEFT ADRENAL ARTERY FOR TUMOR ISCHEMIA MEDICATIONS: No additional medications. ANESTHESIA/SEDATION: The patient was intubated during the procedure and monitored by the Department of Anesthesia. CONTRAST:  50 mL Visipaque 320 FLUOROSCOPY TIME:  Radiation  Exposure Index (as provided by the fluoroscopic device): 458 mGy Kerma COMPLICATIONS: None immediate. PROCEDURE: Informed consent was obtained from the patient's wife following explanation of the procedure, risks, benefits and alternatives. The patient's wife understands, agrees and consents for the procedure. All questions were addressed. A time out was performed prior to the initiation of the procedure. Maximal barrier sterile technique utilized including caps, mask, sterile gowns, sterile gloves, large sterile drape, hand hygiene, and chlorhexidine prep. Ultrasound was performed to confirm patency of the right common femoral artery. An ultrasound image was saved and recorded. Under ultrasound guidance, access of the artery was accomplished with a micropuncture  set. A 5 French vascular sheath was then advanced into the artery over a guidewire. A 5 French Cobra catheter was advanced into the abdominal aorta. This was then used to selectively catheterize the origin of the left adrenal artery. Selective arteriography was performed. A microcatheter was then advanced through the 5 French catheter into the proximal aspect of the left adrenal artery. Additional selective arteriography was performed. The catheter was then further advanced into the left adrenal artery and further arteriography performed. Particle embolization was performed in the left renal artery with 300-500 micron sized Embosphere particles. Particles were slowly infused by hand injection in diluted contrast. Ultimately, 3/4 vial of particles were instilled. Additional arteriography was performed through the microcatheter. Different micro catheters were utilized in attempting further catheter advancement over guidewires to allow placement of embolization coils into the left adrenal artery. Ultimately microcatheter and 5 French catheter were removed. Hemostasis was obtained at the femoral access site utilizing the Angio-Seal device. FINDINGS: Selective  left adrenal arteriography demonstrates abnormal vascularity supplying the known mass of the left adrenal gland with a rounded region of hypervascular enhancement noted. With arteriography, there was no active extravasation of contrast noted or discrete pseudoaneurysms. The left adrenal artery was quite tortuous after takeoff and it was difficult advancing a microcatheter much beyond its initial superiorly oriented segment. Particle embolization was successful in occluding smaller branches supplying tumor and slowing flow significantly in the main branches of the left adrenal artery. Microcatheter purchase could not be accomplished beyond the proximal segment to allow safe placement of embolization coils with inability to advance a catheter over a guidewire. Catheter arteriography demonstrates dissection of the proximal vessel likely from catheter and guidewire manipulation which was the likely cause of inability to advance the microcatheter. For this reason, proximal coil embolization of the left renal artery was not performed. This does allow for potential future repeat embolization of the left adrenal artery once the dissection heals, should the patient require future embolization such as preoperative embolization. IMPRESSION: Technically successful microsphere particle embolization of left adrenal arterial supply to a bleeding left adrenal mass. Particle embolization was performed with 3/4 of a vial of 300-500 micron sized Embosphere particles. Proximal coil embolization could not be performed due to inability to advance the microcatheter sufficiently. Arteriography did demonstrate proximal dissection of the left adrenal artery during the procedure which was likely the cause of inability to advance the catheter. Lack of use of embolization coils does allow for potential future embolization of the left adrenal artery once the dissection heals. Electronically Signed   By: Irish Lack M.D.   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 The patient presents for emergent arteriography and embolization. EXAM: 1. ULTRASOUND GUIDANCE FOR VASCULAR ACCESS OF THE RIGHT COMMON FEMORAL ARTERY 2. SELECTIVE ARTERIOGRAPHY OF THE LEFT ADRENAL ARTERY (FIRST ORDER OFF OF ABDOMINAL AORTA) 3. PARTICLE EMBOLIZATION OF THE LEFT ADRENAL ARTERY FOR TUMOR ISCHEMIA MEDICATIONS: No additional medications. ANESTHESIA/SEDATION: The patient was intubated during the procedure and monitored by the Department of Anesthesia. CONTRAST:  50 mL Visipaque 320 FLUOROSCOPY TIME:  Radiation Exposure Index (as provided by the fluoroscopic device): 458 mGy Kerma COMPLICATIONS: None immediate. PROCEDURE: Informed consent was obtained from the patient's wife following explanation of the procedure, risks, benefits and alternatives. The patient's wife understands, agrees and consents for the procedure. All questions were addressed. A time out was  performed prior to the initiation of the procedure. Maximal barrier sterile technique utilized including caps, mask, sterile gowns, sterile gloves, large sterile drape, hand hygiene, and chlorhexidine prep. Ultrasound was performed to confirm patency of the right common femoral artery. An ultrasound image was saved and recorded. Under ultrasound guidance, access of the artery was accomplished with a micropuncture set. A 5 French vascular sheath was then advanced into the artery over a guidewire. A 5 French Cobra catheter was advanced into the abdominal aorta. This was then used to selectively catheterize the origin of the left adrenal artery. Selective arteriography was performed. A microcatheter was then advanced through the 5 French catheter into the proximal aspect of the left adrenal artery. Additional selective arteriography was performed. The catheter was then further advanced  into the left adrenal artery and further arteriography performed. Particle embolization was performed in the left renal artery with 300-500 micron sized Embosphere particles. Particles were slowly infused by hand injection in diluted contrast. Ultimately, 3/4 vial of particles were instilled. Additional arteriography was performed through the microcatheter. Different micro catheters were utilized in attempting further catheter advancement over guidewires to allow placement of embolization coils into the left adrenal artery. Ultimately microcatheter and 5 French catheter were removed. Hemostasis was obtained at the femoral access site utilizing the Angio-Seal device. FINDINGS: Selective left adrenal arteriography demonstrates abnormal vascularity supplying the known mass of the left adrenal gland with a rounded region of hypervascular enhancement noted. With arteriography, there was no active extravasation of contrast noted or discrete pseudoaneurysms. The left adrenal artery was quite tortuous after takeoff and it was difficult advancing a microcatheter much beyond its initial superiorly oriented segment. Particle embolization was successful in occluding smaller branches supplying tumor and slowing flow significantly in the main branches of the left adrenal artery. Microcatheter purchase could not be accomplished beyond the proximal segment to allow safe placement of embolization coils with inability to advance a catheter over a guidewire. Catheter arteriography demonstrates dissection of the proximal vessel likely from catheter and guidewire manipulation which was the likely cause of inability to advance the microcatheter. For this reason, proximal coil embolization of the left renal artery was not performed. This does allow for potential future repeat embolization of the left adrenal artery once the dissection heals, should the patient require future embolization such as preoperative embolization. IMPRESSION:  Technically successful microsphere particle embolization of left adrenal arterial supply to a bleeding left adrenal mass. Particle embolization was performed with 3/4 of a vial of 300-500 micron sized Embosphere particles. Proximal coil embolization could not be performed due to inability to advance the microcatheter sufficiently. Arteriography did demonstrate proximal dissection of the left adrenal artery during the procedure which was likely the cause of inability to advance the catheter. Lack of use of embolization coils does allow for potential future embolization of the left adrenal artery once the dissection heals. Electronically Signed   By: Irish Lack M.D.   On: 03/15/2023 19:10   DG Chest Portable 1 View  Result Date: 03/15/2023 CLINICAL DATA:  Central line placement EXAM: PORTABLE CHEST 1 VIEW COMPARISON:  Radiographs 03/15/2023 at 2:20 p.m. FINDINGS: Left subclavian central line tip in the mid SVC. Additional catheter tubing projects over the left axilla. This may represent a PICC line with tip near the left subclavian vein. Remainder unchanged from earlier today. Endotracheal tube tip in the intrathoracic trachea proximally 3.1 cm from the carina. Defibrillator pads. Stable cardiomediastinal silhouette. Aortic atherosclerotic calcification. Low lung volumes accentuate pulmonary vascularity.  Bibasilar atelectasis/scarring. No definite pleural effusion. No pneumothorax. Known rib fractures are better assessed on CT. IMPRESSION: Left subclavian central line tip in the mid SVC. Additional catheter tubing projects over the left axilla. This may represent a PICC line with tip near the left subclavian vein. Remainder unchanged from earlier today. Electronically Signed   By: Minerva Fester M.D.   On: 03/15/2023 17:46   DG Abd Portable 1 View  Result Date: 03/15/2023 CLINICAL DATA:  NG tube placement EXAM: PORTABLE ABDOMEN - 1 VIEW COMPARISON:  Chest radiographs 03/15/2023 FINDINGS: NG tube tip is  presumably located within the lower esophagus is only partly visualized on this exam. Recommend advancement 7-8 cm. Remainder unchanged from CT earlier today. IMPRESSION: NG tube tip is presumably located within the lower esophagus however is only partially visualized. Recommend advancement prior to use. These results were called by telephone at the time of interpretation on 03/15/2023 at 3:45 pm to provider [facility] , who verbally acknowledged these results. Electronically Signed   By: Minerva Fester M.D.   On: 03/15/2023 15:52   CT Angio Chest/Abd/Pel for Dissection W and/or Wo Contrast  Result Date: 03/15/2023 CLINICAL DATA:  Upper left abdominal pain nausea and vomiting. Acute aortic syndrome suspected EXAM: CT ANGIOGRAPHY CHEST, ABDOMEN AND PELVIS TECHNIQUE: Non-contrast CT of the chest was initially obtained. Multidetector CT imaging through the chest, abdomen and pelvis was performed using the standard protocol during bolus administration of intravenous contrast. Multiplanar reconstructed images and MIPs were obtained and reviewed to evaluate the vascular anatomy. RADIATION DOSE REDUCTION: This exam was performed according to the departmental dose-optimization program which includes automated exposure control, adjustment of the mA and/or kV according to patient size and/or use of iterative reconstruction technique. CONTRAST:  75mL OMNIPAQUE IOHEXOL 350 MG/ML SOLN COMPARISON:  Chest radiograph 03/15/2023; MRI abdomen 03/03/2023; CTA chest 03/01/2023 FINDINGS: CTA CHEST FINDINGS Cardiovascular: Preferential opacification of the thoracic aorta. No evidence of thoracic aortic aneurysm or dissection. Normal heart size. No pericardial effusion. Mediastinum/Nodes: Endotracheal tube tip in the intrathoracic trachea. Unremarkable esophagus. No lymphadenopathy. Lungs/Pleura: No pneumothorax. Trace left pleural effusion. Bibasilar atelectasis/scarring. Trachea and central airways are patent. Musculoskeletal:  Minimal and nondisplaced fractures of the left anterior 2nd-7th ribs and right anterior 2nd-6th ribs. Review of the MIP images confirms the above findings. CTA ABDOMEN AND PELVIS FINDINGS VASCULAR Aorta: Normal in caliber without dissection, penetrating atherosclerotic ulcer, intramural hematoma. Moderate atherosclerotic plaque without hemodynamically significant stenosis. Celiac: Normal variant with the common hepatic artery arising directly from the aorta. The splenic artery is moderately secondary to mass effect from the hematoma (circa series 7/image 146). SMA: Patent. Renals: Patent without aneurysm or dissection. Calcified plaque causes mild narrowing at the origins of both renal arteries. IMA: Patent. Inflow: Scattered atherosclerotic plaque causes up to mild narrowing. No aneurysm or dissection. Veins: No obvious venous abnormality within the limitations of this arterial phase study. Review of the MIP images confirms the above findings. NON-VASCULAR Hepatobiliary: Cholelithiasis. No evidence of cholecystitis. Unremarkable liver. Pancreas: Unremarkable. Spleen: Unremarkable. Adrenals/Urinary Tract: Heterogenous fluid compatible with blood products within the upper left retroperitoneal space extending inferiorly within the pararenal space appears to originate from the left adrenal mass where there is active hemorrhage. The hemorrhage measures approximately 15.0 x 12.6 x 15.9 cm (AP x ML x CC). The borders of the left adrenal mass or obscured by the adjacent hemorrhage however this measures approximately 6.8 x 7.0 cm in the axial plane (7/164). This measured 6.0 by 5.3 cm on MRI  03/03/2023. Unremarkable right adrenal gland. Hemorrhage surrounds the left kidney without mass effect. Bilateral renal cysts which were previously evaluated with MRI 03/03/2023. No follow-up is recommended. No urinary calculi or hydronephrosis. Unremarkable bladder. Stomach/Bowel: Normal caliber large and small bowel. Colonic  diverticulosis without diverticulitis. Normal appendix. Stomach is within normal limits. Lymphatic: No lymphadenopathy. Reproductive: Unremarkable. Other: No free intraperitoneal air. Musculoskeletal: Acute fracture or destructive osseous lesion sclerotic lesions in the bilateral iliac wings are favored to represent bone islands. Review of the MIP images confirms the above findings. IMPRESSION: 1. 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 2. Nondisplaced fractures of the left anterior 2nd-7th ribs and right anterior 2nd-6th ribs. No pneumothorax. Trace left pleural effusion. 3. Increased size of the left adrenal mass, previously evaluated with MRI 03/03/2023 Aortic Atherosclerosis (ICD10-I70.0). Critical Value/emergent results were called by telephone at the time of interpretation on 03/15/2023 at 3:16 pm to provider JULIE HAVILAND , who verbally acknowledged these results. Electronically Signed   By: Minerva Fester M.D.   On: 03/15/2023 15:24   DG Chest Portable 1 View  Result Date: 03/15/2023 CLINICAL DATA:  intubation confrimation EXAM: PORTABLE CHEST 1 VIEW COMPARISON:  CXR 02/28/23 FINDINGS: Endotracheal tube terminates at the level of the thoracic inlet approximately 4 cm above the carina. Low lung volumes. No pleural effusion. No pneumothorax no focal airspace opacity. Likely unchanged cardiac and mediastinal contours when accounting for differences in lung volumes. Visualized upper abdomen is unremarkable. IMPRESSION: Endotracheal tube terminates at the level of the thoracic inlet. Low lung volumes. Electronically Signed   By: Lorenza Cambridge M.D.   On: 03/15/2023 14:37    Cardiac Studies   Cardiac Studies & Procedures   CARDIAC CATHETERIZATION  CARDIAC CATHETERIZATION 03/01/2023  Narrative   Mid LAD lesion is 30% stenosed.  Diffuse mild to moderate CAD.   LV end diastolic pressure is normal.   There is no aortic valve stenosis.   Tortuous right subclavian requiring 85 cm destination  sheath.  Mild to moderate diffuse coronary disease in the major epicardial vessels.  No culprit lesion found.  Patient had a total of 45 minutes of chest pain.  No return of symptoms.  Possibilities include small vessel disease, vasospasm or myocarditis.  Starting heparin 2 hours after TR band removal.  Discussed with the patient's wife, Lanora Manis.  Findings Coronary Findings Diagnostic  Dominance: Co-dominant  Left Anterior Descending There is mild diffuse disease throughout the vessel. Mid LAD lesion is 30% stenosed.  Left Circumflex The vessel exhibits minimal luminal irregularities.  Right Coronary Artery There is mild diffuse disease throughout the vessel.  Intervention  No interventions have been documented.   STRESS TESTS  MYOCARDIAL PERFUSION IMAGING 01/15/2020  Narrative  Nuclear stress EF: 46%. Visually, the EF appears better than the 46% computer calculation .  There was no ST segment deviation noted during stress.  This is a low risk study. There is no evidence of ischemia or previous infarction.  The study is normal.  ECHOCARDIOGRAM  ECHOCARDIOGRAM COMPLETE 03/16/2023  Narrative ECHOCARDIOGRAM REPORT    Patient Name:   Shane Sampson Date of Exam: 03/16/2023 Medical Rec #:  [id number]     Height:       72.0 in Accession #:    [id number]    Weight:       164.0 lb Date of Birth:  [birth year]     BSA:          1.958 m Patient Age:    40 years  BP:           121/85 mmHg Patient Gender: M             HR:           120 bpm. Exam Location:  Inpatient  Procedure: Limited Echo  Indications:    Acutre respiratory distress R06.03, Cardiomyopathy-Unspecified I42.9  History:        Patient has prior history of Echocardiogram examinations, most recent 03/01/2023. Previous Myocardial Infarction; Risk Factors:Hypertension and Dyslipidemia. Takotsubo syndrome, CKD stage 3.  Sonographer:    Lucendia Herrlich Sonographer#2:  Irving Burton Senior Referring Phys: Reche Dixon MURALI  RAMASWAMY  IMPRESSIONS   1. Endocardial segments not visualized so cannot comment on EF or wall motion. 2. Right ventricular systolic function was not well visualized. The right ventricular size is not well visualized. 3. The mitral valve was not well visualized. Cannot assess for mitral regurgistation due to poor acoustical windows. 4. The aortic valve was not well visualized. Cannot assess for AI due to poor images. 5. Pulmonic valve regurgitation Cannot assess for PR due to poor windows. 6. Consider TEE or cardiac MRI  FINDINGS Left Ventricle: Endocardial segments not visualized so cannot comment on EF or wall motion. Left ventricular diastolic function could not be evaluated.  Right Ventricle: The right ventricular size is not well visualized. Right vetricular wall thickness was not assessed. .  Left Atrium: Left atrial size was not well visualized.  Right Atrium: Right atrial size was not well visualized.  Pericardium: The pericardium was not well visualized.  Mitral Valve: The mitral valve was not well visualized. Cannot assess due to poor acoustical windows mitral valve regurgitation.  Tricuspid Valve: The tricuspid valve is not well visualized. Cannot assess for TR due to images  Aortic Valve: The aortic valve was not well visualized. Cannot assess for AI due to poor images.  Pulmonic Valve: The pulmonic valve was not assessed. Cannot assess for PR due to poor windows.  Aorta: Aortic root could not be assessed.  Venous: The inferior vena cava was not well visualized.  IAS/Shunts: The interatrial septum was not assessed.  Armanda Magic MD Electronically signed by Armanda Magic MD Signature Date/Time: 03/16/2023/9:32:04 AM    Final    CT SCANS  CT CARDIAC SCORING (SELF PAY ONLY) 12/31/2019  Narrative CLINICAL DATA:  67 year old white male with hyperlipidemia.  EXAM: CT CARDIAC CORONARY ARTERY CALCIUM SCORE  TECHNIQUE: Non-contrast imaging through the heart was  performed using prospective ECG gating. Image post processing was performed on an independent workstation, allowing for quantitative analysis of the heart and coronary arteries. Note that this exam targets the heart and the chest was not imaged in its entirety.  COMPARISON:  None.  FINDINGS: CORONARY CALCIUM SCORES:  Left Main: 48.3  LAD: 401  LCx: 309  RCA: 307  Total Agatston Score: 1065  MESA database percentile: 94  AORTA MEASUREMENTS:  Ascending Aorta: 35 mm  Descending Aorta: 28 mm  OTHER FINDINGS:  Small amount of calcium at the aortic arch and descending thoracic aorta. Normal caliber of the thoracic aorta. Large amount of coronary artery calcium. No significant lymph node enlargement in the visualized mediastinal or hilar regions. Heart size is normal. No significant pericardial fluid. Images of the upper abdomen are unremarkable. No large pleural effusions. 5 mm nodule in the right lower lobe on sequence 9, image 63. Partially calcified nodule in the right middle lobe on sequence 9 image, image 37 measures 5 mm. 4 mm nodule in the  right upper lobe on sequence 9, image 6. Subtle nodule in the right middle lobe may be associated with a small fissure on sequence 9, image 30. 2 mm nodule in the lingula on sequence 9, image 48. No acute bone abnormality.  IMPRESSION: 1. Age advanced coronary artery calcium. Coronary calcium score is 1065 and this is at percentile 94 for patients of the same age, gender and ethnicity. 2. Several small pulmonary nodules scattered throughout the visualized lungs. Some of these pulmonary nodules are calcified. These small pulmonary nodules are indeterminate but could represent postinflammatory changes and granulomas. No follow-up needed if patient is low-risk (and has no known or suspected primary neoplasm). Non-contrast chest CT can be considered in 12 months if patient is high-risk. This recommendation follows the  consensus statement: Guidelines for Management of Incidental Pulmonary Nodules Detected on CT Images: From the Fleischner Society 2017; Radiology 2017; 284:228-243. 3.  Aortic Atherosclerosis (ICD10-I70.0).   Electronically Signed By: Richarda Overlie M.D. On: 12/31/2019 10:52  CARDIAC MRI  MR CARDIAC MORPHOLOGY W WO CONTRAST 03/02/2023  Narrative CLINICAL DATA:  2M p/w chest pain, troponin up to 1420. Cath shows nonobstructive CAD. Echo with EF 45-50%, with hypokinesis of mid segments  EXAM: CARDIAC MRI  TECHNIQUE: The patient was scanned on a 1.5 Tesla Siemens magnet. A dedicated cardiac coil was used. Functional imaging was done using Fiesta sequences. 2,3, and 4 chamber views were done to assess for RWMA's. Modified Simpson's rule using a short axis stack was used to calculate an ejection fraction on a dedicated work [employer]. The patient received 10 cc of Gadavist. After 10 minutes inversion recovery sequences were used to assess for infiltration and scar tissue. Phase contrast velocity mapping was performed above the aortic and pulmonic valves  CONTRAST:  10 cc  of Gadavist  FINDINGS: Left ventricle:  -Normal size  -Asymmetric hypertrophy measuring 17mm in basal septum (8mm in posterior wall)  -Moderate systolic dysfunction.  Hypokinesis of mid segments  -Elevated ECV (30%).  Normal T2 values  -RV insertion site LGE. LGE accounts for 2% of total myocardial mass  LV EF: 34% (Normal 49-79%)  Absolute volumes:  LV EDV: (Normal 95-215 mL)  LV ESV: (Normal 25-85 mL)  LV SV: 60mL (Normal 61-145 mL)  CO: 6.7L/min (Normal 3.4-7.8 L/min)  Indexed volumes:  LV EDV: 3mL/sq-m (Normal 50-108 mL/sq-m)  LV ESV: 56mL/sq-m (Normal 11-47 mL/sq-m)  LV SV: 57mL/sq-m (Normal 33-72 mL/sq-m)  CI: 3.4L/min/sq-m (Normal 1.8-4.2 L/min/sq-m)  Right ventricle: Normal size and systolic function  RV EF:  50% (Normal 51-80%)  Absolute  volumes:  RV EDV: (Normal 109-217 mL)  RV ESV: 63mL (Normal 23-91 mL)  RV SV: 63mL (Normal 71-141 mL)  CO: 6.9L/min (Normal 2.8-8.8 L/min)  Indexed volumes:  RV EDV: 81mL/sq-m (Normal 58-109 mL/sq-m)  RV ESV: 17mL/sq-m (Normal 12-46 mL/sq-m)  RV SV: 64mL/sq-m (Normal 38-71 mL/sq-m)  CI: 3.5L/min/sq-m (Normal 1.7-4.2 L/min/sq-m)  Left atrium: Normal size  Right atrium: Normal size  Mitral valve: Trivial regurgitation  Aortic valve: Trivial regurgitation  Tricuspid valve: Trivial regurgitation  Pulmonic valve: Trivial regurgitation  Aorta: Normal proximal ascending aorta  Pericardium: Normal  IMPRESSION: 1. There is hypokinesis of all LV mid ventricular segments, which considering presentation with troponin elevation and normal coronary arteries on cath, suggests mid ventricular Takotsubo cardiomyopathy  2. Asymmetric LV hypertrophy measuring 17mm in basal septum (8mm in posterior wall), meeting criteria for hypertrophic cardiomyopathy  3. RV insertion site LGE, which is commonly  seen in HCM. LGE accounts for 2% of total myocardial mass  4.  Normal LV size with moderate systolic dysfunction (EF 34%)  5.  Normal RV size and systolic function (EF 50%)   Electronically Signed By: Epifanio Lesches M.D. On: 03/02/2023 23:36          Patient Profile     67 y.o. 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 Cardiology consulted for labile blood pressures.   Assessment & Plan    #Acute Adrenal Hemorrhage s/p IR Embolization: #Hemorrhagic Shock: -Patient with known left adrenal mass presenting with LUQ pain, nausea and vomiting found to have left adrenal hemorrhage now s/p IR embolization -Initially  required pressor support, now with improvement of HD status  -Continue management per ICU team  #Recent Takostubo CM: -Patient with admission in 02/2023 for NSTEMI with clean coronaries on cath -TTE with LVEF 45-50% with mid-segment WMA most consistent with atypical takostubo pattern -CMR consistent with EF 34% with mid-wall hypokinesis consistent with takostubo -Now presented with hemorrhagic shock as above -TTE yesterday uninterruptable; recommend repeating limited once more comfortable and able to lie on left side  -Holding GDMT in the setting of shock as above; resume as able  #HD Lability: -Occurred in the setting of left adrenal hemorrhage, hemorrhagic shock and PEA arrest -Overall, BP has stabilized -Continue supportive measures  Cardiology will sign-off. Will arrange CV follow-up once closer to discharge.      For questions or updates, please contact [facility] Please consult [email or web address] for contact info under        Signed, Meriam Sprague, MD  03/17/2023, 9:47 AM

## 2023-03-17 NOTE — Discharge Summary (Signed)
Physician Discharge Summary         Patient ID: KYERE GALATI MRN: [id number] DOB/AGE: December 25, 1955 67 y.o.  Admit date: 03/15/2023 Discharge date: 03/17/2023  Discharge Diagnoses:    Active Hospital Problems   Diagnosis Date Noted   Adrenal hemorrhage (HCC) 03/15/2023   Hemorrhagic shock (HCC) 03/17/2023   AKI (acute kidney injury) (HCC) 03/17/2023   Mass of left adrenal gland (HCC) 03/16/2023   Cardiac arrest [facility]) 03/15/2023    Resolved Hospital Problems  No resolved problems to display.      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  Recently hospitalized for chest pain on 5/20-5/24, dx with takotsubo cardiomyopathy and Acute HFmrEF. 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 Discharged with outpatient eval (pending surgery referral and metanephrines).    He presented to the ED 03/15/23, patient has felt sick the last 2 days with really low bps (<90/60s) and HR in 120-130s. K+ 5.7 on 5/31 and instructed by cardiology to adjust GDMT. This morning, started to feel very nauseous with "stomach cramping." Wife stepped out to grab lunch when severe LUQ and flank pain started and EMS was called.   CT abdomen on 6/5 showed increased pleural effusions, decrease in size of left RP hematoma, 1.7 cm fluid collection adjacent to right common femoral artery and he underwent on emergent L adrenal artery embolization.  There was a possible dissection of the artery during the procedure, therefore he was referred to [facility] for possible adrenalectomy once stable.    Discharge Plan by Active Problems     Left adrenal mass with hemorrhage s/p emergent left  adrenal artery embolization with possible dissection  c/f pheochromocytoma Now stable s/p emergent left adrenal arteriography. Particular with exertion done. IR unable to perform coil embolization due to vessel tortuosity, possible dissection of artery during procedure. Repeat CT 6/6 with no acute changes, decreased hematoma.  - Accepted to [facility] 6/6, will likely need adrenalectomy  - Awaiting 24h urine metanephrines    Acute hypoxic respiratory failure in setting of cardiac arrest  Multiple rib fractures S/p CPR and epi x1, ROSC achieved on arrival. CT chest with left anterior 2nd-7th rib and right anterior 2nd-6th rib fractures. Worsening pain throughout the night with reassuring STAT CT. Required dilaudid and morphine.  - Nasal cannula as needed; goal spo2 > 90 - Adequate pulmonary hygiene, encourage ICS - Multimodal pain regimen:             - Sch tylenol, robaxin, lidocaine patches             - oxycodone 5mg  q4h prn and IV dilaudid q4h prn for moderate to severe - PT/ OT eval   Hemorrhagic shock with lactic acidosis s/p 4U pRBC in ER, improved  Now stable, no evidence of obvious hemorrhage. Hgb 10.8. Lactate downtrending. - Transfuse again if Hgb < 7 - Higher threshold if suspecting ACS (Hgb <8) - Transfuse regardless Hgb value if active bleeding with hemodynamic instability - Try to transfuse 1U pRBC as possible with exception of active hemorrhage   AKI on CKD III 2/2 ATN  Hyperphosphatemia Worsening Cr to 3.75. Baseline Cr around 1.5. Secondary to  shock and IV contrast. Phos 9.3 this AM. - Maintenance IV fluids - NS 75 mL/hr. - Monitor BMP - Monitor I/O, foley   Labile hypotension and hypertension  Had labile Bps after surgery, now improved and not requiring pressors. Mildly hypertensive this AM. - resume home coreg at lower dose - 3.125mg  BID - MAP goal > 65   HFmrEF  Takotsubo cardiomyopathy  PEA cardiac arrest  Dyslipidemia TTE with LVEF 45-50% in 02/2023. Home  regimen PTA: ASA, statin, coreg, jardiance, spironolactone.  - TTE once more comfortable and able to lie on left side, failed attempt 6/5. - Holding GDMT for now   Leukocytosis, improving  WBC > 50 yesterday, now down trending. Likely secondary to acute stress and steroids now discontinued - Continue to monitor   Hypoglycemia, resolved  Prediabetes A1c 6.3. Home jardiance. Hypoglycemic yesterday, responded well to juice and gentle D5W. - CBGs + SSI - Goal glucose 140-180 - Hypoglycemia protocol    Nausea Likely exacerbated by pain and stress. - zofran prn   BPH - on home tamsulosin   Significant Hospital tests/ studies   CT abd/pelvis 6/4 IMPRESSION: 1. 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 2. Nondisplaced fractures of the left anterior 2nd-7th ribs and right anterior 2nd-6th ribs. No pneumothorax. Trace left pleural effusion. 3. Increased size of the left adrenal mass, previously evaluated with MRI 03/03/2023  CT abd/pelvis 6/6 IMPRESSION: 1. Increased small bilateral pleural effusions and associated atelectasis since 03/15/2023. Pneumonia is difficult to exclude. 2. No significant change in size of the 7.0 cm left adrenal mass. 3. Left retroperitoneal hematoma extending inferiorly in the anterior pararenal space into the pelvis has slightly decreased from 03/15/2023. There is increased blood products in the pelvis likely due to redistribution. 4. Small 1.7 cm fluid collection adjacent to the right common femoral artery likely related to access for embolization performed 03/15/2023. If there is concern for pseudoaneurysm, ultrasound is recommended. Procedures     6/4 IR L adrenal angiogram and embolectomy 6/4 CVC and art line Intubated 6/4-6/5   Culture data/antimicrobials    6/4 blood cultures pending   Consults      Discharge Exam: BP 119/70   Pulse 98   Temp 98.4 F (36.9 C)   Resp (!) 25   Ht 6' (1.829 m)   Wt 74.4 kg   SpO2  92%   BMI 22.24 kg/m   General:  elderly M resting in bed in no distress HEENT: MM pink/moist Neuro: alert and oriented CV: sinus on tele PULM:  no distress on 4L [place]   Labs at discharge   Lab Results  Component Value Date   CREATININE 3.75 (H) 03/17/2023   BUN 67 (H) 03/17/2023   NA 134 (L) 03/17/2023   K 4.7 03/17/2023   CL 98 03/17/2023   CO2 23 03/17/2023   Lab Results  Component Value Date   WBC 32.9 (H) 03/17/2023   HGB 10.8 (L) 03/17/2023   HCT 31.8 (L) 03/17/2023   MCV 88.8 03/17/2023   PLT 284 03/17/2023   Lab Results  Component Value Date   ALT 21 03/17/2023   AST 25 03/17/2023   ALKPHOS 56 03/17/2023   BILITOT 1.5 (H) 03/17/2023   Lab Results  Component Value Date   INR 1.1 03/16/2023   INR 1.3 (H) 03/15/2023   INR 1.2 03/01/2023    Current radiological studies    CT ABDOMEN PELVIS WO CONTRAST  Result Date: 03/17/2023 CLINICAL DATA:  Acute nonlocalized abdominal  pain EXAM: CT ABDOMEN AND PELVIS WITHOUT CONTRAST TECHNIQUE: Multidetector CT imaging of the abdomen and pelvis was performed following the standard protocol without IV contrast. RADIATION DOSE REDUCTION: This exam was performed according to the departmental dose-optimization program which includes automated exposure control, adjustment of the mA and/or kV according to patient size and/or use of iterative reconstruction technique. COMPARISON:  CTA chest abdomen and pelvis 03/15/2023 FINDINGS: Lower chest: Increased small bilateral pleural effusions and associated atelectasis since 03/15/2023. Pneumonia is difficult to exclude. Hepatobiliary: Unremarkable liver. Vicarious excretion of contrast in the gallbladder. No biliary dilation. Pancreas: Unremarkable. Spleen: Unremarkable. Adrenals/Urinary Tract: Redemonstrated heterogenous 7.0 x 6.2 cm left adrenal mass. Is not substantially changed in size from 03/15/2023. Heterogenous hyperdense fluid in the left retroperitoneum has slightly decreased from  03/15/2023 compatible with decreasing hematoma. Normal right adrenal gland. No urinary calculi or hydronephrosis. Foley catheter in the nondistended bladder Stomach/Bowel: Normal caliber large and small bowel. No bowel wall thickening. Large stool burden in the cecum. Stomach is within normal limits. Vascular/Lymphatic: Advanced aortic atherosclerotic calcification. Small 1.7 cm fluid collection adjacent to the right common femoral artery likely related to access for embolization performed 03/15/2023. If there is concern for pseudoaneurysm, ultrasound is recommended. Reproductive: Unremarkable. Other: Left retroperitoneal hematoma extending inferiorly in the anterior pararenal space into the pelvis. There is increased blood products in the pelvis likely due to redistribution. Increased fluid about the liver also likely due to redistribution. No free intraperitoneal air. Musculoskeletal: No acute osseous abnormality. IMPRESSION: 1. Increased small bilateral pleural effusions and associated atelectasis since 03/15/2023. Pneumonia is difficult to exclude. 2. No significant change in size of the 7.0 cm left adrenal mass. 3. Left retroperitoneal hematoma extending inferiorly in the anterior pararenal space into the pelvis has slightly decreased from 03/15/2023. There is increased blood products in the pelvis likely due to redistribution. 4. Small 1.7 cm fluid collection adjacent to the right common femoral artery likely related to access for embolization performed 03/15/2023. If there is concern for pseudoaneurysm, ultrasound is recommended. Aortic Atherosclerosis (ICD10-I70.0). Electronically Signed   By: Minerva Fester M.D.   On: 03/17/2023 02:55   ECHOCARDIOGRAM COMPLETE  Result Date: 03/16/2023    ECHOCARDIOGRAM REPORT   Patient Name:   Shane Sampson Date of Exam: 03/16/2023 Medical Rec #:  [id number]     Height:       72.0 in Accession #:    [id number]    Weight:       164.0 lb Date of Birth:  07/27/1956     BSA:           1.958 m Patient Age:    67 years      BP:           121/85 mmHg Patient Gender: M             HR:           120 bpm. Exam Location:  Inpatient Procedure: Limited Echo Indications:    [place]cutre respiratory distress R06.03,                 Cardiomyopathy-Unspecified I42.9  History:        Patient has prior history of Echocardiogram examinations, most                 recent 03/01/2023. Previous Myocardial Infarction; Risk                 Factors:Hypertension and Dyslipidemia. Takotsubo syndrome, CKD  stage 3.  Sonographer:    Lucendia Herrlich Sonographer#2:  Irving Burton Senior Referring Phys: Reche Dixon MURALI RAMASWAMY IMPRESSIONS  1. Endocardial segments not visualized so cannot comment on EF or wall motion.  2. Right ventricular systolic function was not well visualized. The right ventricular size is not well visualized.  3. The mitral valve was not well visualized. Cannot assess for mitral regurgistation due to poor acoustical windows.  4. The aortic valve was not well visualized. Cannot assess for AI due to poor images.  5. Pulmonic valve regurgitation Cannot assess for PR due to poor windows.  6. Consider TEE or cardiac MRI FINDINGS  Left Ventricle: Endocardial segments not visualized so cannot comment on EF or wall motion. Left ventricular diastolic function could not be evaluated. Right Ventricle: The right ventricular size is not well visualized. Right vetricular wall thickness was not assessed. . Left Atrium: Left atrial size was not well visualized. Right Atrium: Right atrial size was not well visualized. Pericardium: The pericardium was not well visualized. Mitral Valve: The mitral valve was not well visualized. Cannot assess due to poor acoustical windows mitral valve regurgitation. Tricuspid Valve: The tricuspid valve is not well visualized. Cannot assess for TR due to images  Aortic Valve: The aortic valve was not well visualized. Cannot assess for AI due to poor images. Pulmonic Valve: The  pulmonic valve was not assessed. Cannot assess for PR due to poor windows. Aorta: Aortic root could not be assessed. Venous: The inferior vena cava was not well visualized. IAS/Shunts: The interatrial septum was not assessed. Armanda Magic MD Electronically signed by Armanda Magic MD Signature Date/Time: 03/16/2023/9:32:04 AM    Final     Disposition:     There are no questions and answers to display.          Allergies as of 03/17/2023   No Known Allergies      Medication List     STOP taking these medications    aspirin 81 MG tablet   atorvastatin 80 MG tablet Commonly known as: LIPITOR   Entresto 24-26 MG Generic drug: sacubitril-valsartan   ezetimibe 10 MG tablet Commonly known as: ZETIA   Jardiance 10 MG Tabs tablet Generic drug: empagliflozin   nitroGLYCERIN 0.4 MG SL tablet Commonly known as: NITROSTAT   spironolactone 25 MG tablet Commonly known as: ALDACTONE   tamsulosin 0.4 MG Caps capsule Commonly known as: FLOMAX       TAKE these medications    acetaminophen 500 MG tablet Commonly known as: TYLENOL Take 2 tablets (1,000 mg total) by mouth every 6 (six) hours. Start taking on: March 18, 2023   carvedilol 3.125 MG tablet Commonly known as: COREG Take 1 tablet (3.125 mg total) by mouth 2 (two) times daily with a meal. Start taking on: March 18, 2023 What changed:  medication strength how much to take when to take this   Chlorhexidine Gluconate Cloth 2 % Pads Apply 6 each topically daily. Start taking on: March 18, 2023   insulin aspart 100 UNIT/ML injection Commonly known as: novoLOG Inject 0-5 Units into the skin at bedtime.   insulin aspart 100 UNIT/ML injection Commonly known as: novoLOG Inject 0-9 Units into the skin 3 (three) times daily with meals. Start taking on: March 18, 2023   lidocaine 5 % Commonly known as: LIDODERM Place 2 patches onto the skin daily. Remove & Discard patch within 12 hours or as directed by MD Start taking on:  March 18, 2023   Methocarbamol 1000 MG  Tabs Take 1,000 mg by mouth 3 (three) times daily.   mouth rinse Liqd solution 15 mLs by Mouth Rinse route as needed (for oral care).   ondansetron 4 MG/2ML Soln injection Commonly known as: ZOFRAN Inject 2 mLs (4 mg total) into the vein every 4 (four) hours as needed for nausea or vomiting.   senna-docusate 8.6-50 MG tablet Commonly known as: Senokot-S Take 1 tablet by mouth 2 (two) times daily.         Follow-up appointment   As scheduled after discharge from [facility]  Discharge Condition:    stable    Signed:  Darcella Gasman Beverley Sherrard, PA-C [facility] See Amion for pager If no response to pager , please call [phone] until 7pm After 7:00 pm call Elink  [phone]

## 2023-03-17 NOTE — Progress Notes (Signed)
Transported patient to and back from CT with no complications.

## 2023-03-17 NOTE — Progress Notes (Signed)
PT Cancellation Note  Patient Details Name: Shane Sampson MRN: [id number] DOB: Apr 10, 1956   Cancelled Treatment:    Reason Eval/Treat Not Completed: Pain limiting ability to participate (pt reporting pain 7-8/10 despite premedication and not currently able to tolerate mobility)   [name] [name] 03/17/2023, 11:46 AM [name], PT Acute Rehabilitation Services Office: [phone]

## 2023-03-17 NOTE — Progress Notes (Signed)
Report called to receiving RN, Belenda Cruise, at [facility]. [facility] life flight transport has been called and the patient has been added to list of transports. This RN was informed that it would be a few hours before patient will be picked up. Receiving nurse, patient, and patient's family updated with time frame. All questions answered for patient and patient's family at this time.

## 2023-03-17 NOTE — Progress Notes (Signed)
eLink Physician-Brief Progress Note Patient Name: Shane Sampson DOB: 07-27-1956 MRN: [id number]   Date of Service  03/17/2023  HPI/Events of Note  CT abdomen / pelvis reviewed.  eICU Interventions  No acute findings.        Vidal Lampkins U Pacer Dorn 03/17/2023, 3:12 AM

## 2023-03-17 NOTE — Progress Notes (Signed)
PT Cancellation Note  Patient Details Name: Shane Sampson MRN: [id number] DOB: Jun 30, 1956   Cancelled Treatment:    Reason Eval/Treat Not Completed: Other (comment) (RN holding pressure after aline removed)   Analysa Nutting B Leva Baine 03/17/2023, 10:20 AM [name], PT Acute Rehabilitation Services Office: [phone]

## 2023-03-17 NOTE — Progress Notes (Signed)
eLink Physician-Brief Progress Note Patient Name: Shane Sampson DOB: 1956-04-24 MRN: [id number]   Date of Service  03/17/2023  HPI/Events of Note  Patient with persistent predominantly upper abdominal pain / tenderness to palpation, he is however not guarding and rebound is not present.  eICU Interventions  Will switch analgesia to Dilaudid 0.5 - 1 mg iv Q 4 hours PRN pain and obtain a CT scan of his abdomen / pelvis to exclude an evolving acute abdomen.        Maria Gallicchio U Kagen Kunath 03/17/2023, 1:18 AM

## 2023-03-18 ENCOUNTER — Other Ambulatory Visit (HOSPITAL_COMMUNITY): Payer: Self-pay

## 2023-03-18 NOTE — Progress Notes (Signed)
Patient picked up by [employer]. VSS upon discharge. Receiving unit aware that patient is on the way.

## 2023-03-19 LAB — CULTURE, BLOOD (ROUTINE X 2)

## 2023-03-20 LAB — POCT I-STAT 7, (LYTES, BLD GAS, ICA,H+H)
Acid-base deficit: 13 mmol/L — ABNORMAL HIGH (ref 0.0–2.0)
Acid-base deficit: 7 mmol/L — ABNORMAL HIGH (ref 0.0–2.0)
Bicarbonate: 15.7 mmol/L — ABNORMAL LOW (ref 20.0–28.0)
Bicarbonate: 19.4 mmol/L — ABNORMAL LOW (ref 20.0–28.0)
Calcium, Ion: 1.14 mmol/L — ABNORMAL LOW (ref 1.15–1.40)
Calcium, Ion: 1.08 mmol/L — ABNORMAL LOW (ref 1.15–1.40)
HCT: 42 % (ref 39.0–52.0)
HCT: 40 % (ref 39.0–52.0)
Hemoglobin: 14.3 g/dL (ref 13.0–17.0)
Hemoglobin: 13.6 g/dL (ref 13.0–17.0)
O2 Saturation: 100 %
O2 Saturation: 100 %
Patient temperature: 36
Patient temperature: 34
Potassium: 6.3 mmol/L (ref 3.5–5.1)
Potassium: 5.9 mmol/L — ABNORMAL HIGH (ref 3.5–5.1)
Sodium: 134 mmol/L — ABNORMAL LOW (ref 135–145)
Sodium: 136 mmol/L (ref 135–145)
TCO2: 17 mmol/L — ABNORMAL LOW (ref 22–32)
TCO2: 21 mmol/L — ABNORMAL LOW (ref 22–32)
pCO2 arterial: 44.1 mmHg (ref 32–48)
pCO2 arterial: 35.3 mmHg (ref 32–48)
pH, Arterial: 7.154 — CL (ref 7.35–7.45)
pH, Arterial: 7.333 — ABNORMAL LOW (ref 7.35–7.45)
pO2, Arterial: 303 mmHg — ABNORMAL HIGH (ref 83–108)
pO2, Arterial: 282 mmHg — ABNORMAL HIGH (ref 83–108)

## 2023-03-20 LAB — CULTURE, BLOOD (ROUTINE X 2)
Culture: NO GROWTH
Culture: NO GROWTH

## 2023-03-24 LAB — METANEPHRINES, PLASMA
Metanephrine, Free: 688.3 pg/mL — ABNORMAL HIGH (ref 0.0–88.0)
Normetanephrine, Free: 5788.2 pg/mL — ABNORMAL HIGH (ref 0.0–285.2)

## 2023-03-26 NOTE — Progress Notes (Addendum)
Cardiology Clinic Note   Date: 03/28/2023 ID: Shane Sampson 1956/02/11, MRN [id number]  Primary Cardiologist:  Little Ishikawa, MD  Patient Profile    Shane Sampson is a 67 y.o. male who presents to the clinic today for hospital follow up.     Past medical history significant for: CAD. LHC 03/01/2023 (NSTEMI): Mild to moderate diffuse coronary disease in the major epicardial vessels.  No culprit lesion found.  Possibility of small vessel disease, vasospasm or myocarditis. HFmrEF/Takotsubo cardiomyopathy. Echo 03/01/2023: EF 45 to 50%.  Atypical pattern of abnormal wall motion involving the mid segments of the heart with preserved apical contractility, suspect midcavity variant of Takotsubo syndrome.  Grade I DD.  Normal RV function mild LAE. Cardiac MRI 03/02/2023: There is hypokinesis of all LV mid ventricular segments which considering presentation with troponin elevated normal coronary arteries on cath suggest mid ventricular Takotsubo cardiomyopathy.  Asymmetric LV hypertrophy and basal septum meeting criteria for hypertrophic cardiomyopathy.  Normal LV size with moderate systolic dysfunction.  Normal RV size and function. Echo 03/18/2023 (performed at [facility]): Normal LV function.  Mild LVH.  Normal LA pressures with normal diastolic function.  Normal RV function.  Trivial MR/TR.  No valvular stenosis. Cardiac arrest from hemorrhagic shock June 2024. Hypertension. Hyperlipidemia. Lipid panel 03/01/2023: LDL 43, HDL 30, TG 86, total 90. LPa 03/01/2023: 164.7. CKD stage IIIa.     History of Present Illness    Shane Sampson was first evaluated by Dr. Bjorn Pippin on 01/02/2020 for CAD at the request of Dr. Waynard Edwards. Patient had undergone calcium scoring which showed a calcium score of 1065 (94th percentile). Nuclear stress test was a low risk study. Echo showed normal LV/RV function, Grade II DD, mild LAE, mild aortic valve sclerosis without stenosis.   Patient presented to the ED  on 02/28/2023 with complaints of central upper nonradiating chest pain.  Upon EMS arrival patient was hypertensive at 210/120 and tachycardic at 150 bpm.  Troponin positive x 2 294>> 1420.  LHC showed mild to moderate diffuse coronary disease but no culprit lesion.  Echo with mildly reduced LV function and atypical pattern of abnormal wall motion suspicious for a variant of Takotsubo syndrome.  Cardiac MRI confirmed findings of Takotsubo cardiomyopathy.  He was discharged on 03/04/2023.   Patient was last seen in the office by me on 03/11/2023 for hospital follow-up.  He was doing very well at that time walking a couple of times a day.  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  He was hypotensive with a BP of 84/57 and 160 on recheck.  He was very concerned about mass found on his kidney and was anxious to have everything in place in 2 to 3 months to be able to have it addressed.  His carvedilol was decreased secondary to hypotension.  Patient contacted the office on 03/14/2023 with reports of hypotension and tachycardia.  He was instructed to stop Entresto and schedule for follow-up.  Patient presented to the ED on 03/15/2023 with complaints of left upper abdominal pain with nausea and vomiting.  Initial BP 97/79, heart rate 125.  Patient had cardiac arrest witnessed by RN as she went to check on him he had the call bell for assistance and was noted to be diaphoretic and pale.  His eyes rolled back and he became unresponsive.  He was in PEA and CPR was started immediately.  Patient was intubated.  CTA  chest abdomen pelvis showed active hemorrhage in the upper left retroperitoneal space arising from left adrenal mass.  Size of adrenal mass was increased when compared to MRI May 2024.  Patient had nondisplaced fractures of left anterior 2-7 ribs and right anterior 2-6 ribs.  Patient underwent emergent left adrenal arteriography and embolization.  Coil embolization unable to be  performed due to vessel tortuosity and probable dissection of artery during procedure.  BP continued to be labile.  Echo was essentially inconclusive.  Patient extubated 03/16/2023.  Patient was transferred to [facility] for further evaluation of adrenal mass on 03/18/2023.  Today, patient is accompanied by his wife. Patient is doing well since discharge from [facility]. He has been walking daily and slowly increasing his distance with good tolerance. No chest pain, shortness of breath or DOE. His rib pain is improving. He is still taking Tylenol for pain. He is not interested in cardiac rehab as he prefers to walk for exercise. He has a visit coming up on July 1st with the endocrinology surgeon at [facility] to discuss next steps. His wife is concerned about his BP. It was low today on intake at 98/64 and 102/62 on my recheck. Home BP was 128/67 before medications today and 145/76 at 8pm last night before evening dose of medicine. Discussed holding dose of carvedilol if SBP <100. Discussed patient with Dr. Bjorn Pippin after patient and wife left the office. He is in agreement with plan to keep echo and follow up for August. If surgeon wants to move up timeline, we will work with patient to accomplish that.      ROS: All other systems reviewed and are otherwise negative except as noted in History of Present Illness.  Studies Reviewed    ECG is not performed today.       Physical Exam    VS:  BP 102/62 (BP Location: Left Arm, Patient Position: Sitting, Cuff Size: Normal)   Pulse 93   Ht 6' (1.829 m)   Wt 166 lb 9.6 oz (75.6 kg)   SpO2 100%   BMI 22.60 kg/m  , BMI Body mass index is 22.6 kg/m.  GEN: Well nourished, well developed, in no acute distress. Neck: No JVD or carotid bruits. Cardiac:  RRR. No murmurs. No rubs or gallops.   Respiratory:  Respirations regular and unlabored. Clear to auscultation without rales, wheezing or rhonchi. GI: Soft, nontender, nondistended. Extremities: Radials/DP/PT 2+  and equal bilaterally. No clubbing or cyanosis. No edema.  Skin: Warm and dry, no rash. Neuro: Strength intact.  Assessment & Plan    CAD.  LHC May 2024 showed mild to moderate diffuse coronary disease.  Patient denies chest pain, shortness of breath or DOE. He is walking daily and slowly increasing his distance with good tolerance.  Continue amlodipine, carvedilol, atorvastatin and Zetia.  Cardiac arrest from hemorrhagic shock. Patient suffered cardiac arrest from hemorrhagic shock from bleeding from adrenal tumor. He underwent embolization to stop the bleeding. He was stabilized and eventually transferred to [facility] for further evaluation of adrenal mass. He reports his ribs are healing from fractures from CPR. He is taking tylenol for pain. Will get CBC today.  HFmrEF/Takotsubo cardiomyopathy.  Echo May 2024 showed EF 45 to 50%, atypical pattern of abnormal wall motion suspicious for variant of Takotsubo syndrome.  Cardiac MRI May 2024 6 showed hypokinesis of all LV mid ventricular segments suggestive of mid ventricular Takotsubo cardiomyopathy.  Repeat echo performed at [facility] showed normal LV/RV function, mild LVH. Patient  denies lower extremity edema. Euvolemic and well compensated on exam. He is scheduled for repeat echo in August.  Hypertension. BP today 98/64 on intake and 102/62 on my recheck. Home BP this morning prior to medication was 128/67 and last night prior to evening dose of medication it was 145/76. No dizziness. Discussed holding dose of carvedilol if SBP <100. They voiced understanding. Will get a BMP and CBC today.  Hyperlipidemia.  LDL May 2024 43, at goal.  Patient's Lipitor was increased and Zetia added during recent hospitalization.  Will repeat lipid panel and LFTs sometime in July/August.  Continue atorvastatin and Zetia.  Disposition: CBC and BMP today. Echo 05/31/2023. Return for previously scheduled visit with Dr. Bjorn Pippin or sooner as needed.           Signed, Etta Grandchild. Khoury Siemon, DNP, NP-C

## 2023-03-28 ENCOUNTER — Encounter: Payer: Self-pay | Admitting: Student

## 2023-03-28 ENCOUNTER — Ambulatory Visit: Payer: 59 | Attending: Student | Admitting: Student

## 2023-03-28 VITALS — BP 102/62 | HR 93 | Ht 72.0 in | Wt 166.6 lb

## 2023-03-28 DIAGNOSIS — I469 Cardiac arrest, cause unspecified: Secondary | ICD-10-CM | POA: Diagnosis not present

## 2023-03-28 DIAGNOSIS — R578 Other shock: Secondary | ICD-10-CM | POA: Diagnosis not present

## 2023-03-28 DIAGNOSIS — Z79899 Other long term (current) drug therapy: Secondary | ICD-10-CM | POA: Diagnosis not present

## 2023-03-28 DIAGNOSIS — E785 Hyperlipidemia, unspecified: Secondary | ICD-10-CM

## 2023-03-28 DIAGNOSIS — I2510 Atherosclerotic heart disease of native coronary artery without angina pectoris: Secondary | ICD-10-CM | POA: Diagnosis not present

## 2023-03-28 DIAGNOSIS — I5022 Chronic systolic (congestive) heart failure: Secondary | ICD-10-CM

## 2023-03-28 DIAGNOSIS — I10 Essential (primary) hypertension: Secondary | ICD-10-CM

## 2023-03-28 LAB — CBC

## 2023-03-28 LAB — BASIC METABOLIC PANEL
Chloride: 102 mmol/L (ref 96–106)
Creatinine, Ser: 1.13 mg/dL (ref 0.76–1.27)
Sodium: 138 mmol/L (ref 134–144)

## 2023-03-28 NOTE — Patient Instructions (Addendum)
Medication Instructions:   Your physician recommends that you continue on your current medications as directed. Please refer to the Current Medication list given to you today.  *If you need a refill on your cardiac medications before your next appointment, please call your pharmacy*  Lab Work: Shane Levering, NP recommends that you have lab work TODAY:  BMP CBC  If you have labs (blood work) drawn today and your tests are completely normal, you will receive your results only by: MyChart Message (if you have MyChart) OR A paper copy in the mail If you have any lab test that is abnormal or we need to change your treatment, we will call you to review the results.  Testing/Procedures: NONE ordered at this time of appointment   Follow-Up: At [facility], you and your health needs are our priority.  As part of our continuing mission to provide you with exceptional heart care, we have created designated Provider Care Teams.  These Care Teams include your primary Cardiologist (physician) and Advanced Practice Providers (APPs -  Physician Assistants and Nurse Practitioners) who all work together to provide you with the care you need, when you need it.  Your next appointment:   Follow up as previously scheduled   Provider:   Little Ishikawa, MD     Other Instructions

## 2023-03-29 LAB — CBC
Hematocrit: 30 % — ABNORMAL LOW (ref 37.5–51.0)
Hemoglobin: 9.8 g/dL — ABNORMAL LOW (ref 13.0–17.7)
MCH: 30.1 pg (ref 26.6–33.0)
MCHC: 32.7 g/dL (ref 31.5–35.7)
RBC: 3.26 x10E6/uL — ABNORMAL LOW (ref 4.14–5.80)
WBC: 11.9 10*3/uL — ABNORMAL HIGH (ref 3.4–10.8)

## 2023-03-29 LAB — METANEPHRINES, URINE, 24 HOUR
Metaneph Total, Ur: 8715 ug/L
Metanephrines, 24H Ur: 4358 ug/24 hr — ABNORMAL HIGH (ref 58–276)
Normetanephrine, 24H Ur: UNDETERMINED ug/24 hr
Total Volume: 500

## 2023-03-29 LAB — BASIC METABOLIC PANEL
BUN/Creatinine Ratio: 15 (ref 10–24)
BUN: 17 mg/dL (ref 8–27)
CO2: 22 mmol/L (ref 20–29)
Calcium: 8.8 mg/dL (ref 8.6–10.2)
Glucose: 110 mg/dL — ABNORMAL HIGH (ref 70–99)
Potassium: 4.8 mmol/L (ref 3.5–5.2)
eGFR: 71 mL/min/{1.73_m2} (ref >59)

## 2023-04-04 ENCOUNTER — Telehealth: Payer: Self-pay | Admitting: Internal Medicine

## 2023-04-04 NOTE — Telephone Encounter (Signed)
Dr. Marchelle Gearing- I do not see a referral to [facility]. Are you wanting [place] to place referral? Please advise what specialty, thanks.

## 2023-04-04 NOTE — Telephone Encounter (Signed)
He was inpatient. His urine metanephrines are high. Just saw the result. They were establishing with [facility]. Please ensure that has happened

## 2023-04-06 NOTE — Telephone Encounter (Signed)
Please check with wife if he ever got to [facility]

## 2023-04-11 NOTE — Telephone Encounter (Signed)
Notes from Care everywhere indicate that the pt had initial consult with [facility] today.

## 2023-04-15 NOTE — Telephone Encounter (Signed)
TY

## 2023-04-19 ENCOUNTER — Other Ambulatory Visit (HOSPITAL_COMMUNITY): Payer: 59

## 2023-05-29 NOTE — Progress Notes (Unsigned)
Cardiology Office Note:    Date:  06/02/2023   ID:  Shane, Sampson [birth year], MRN [id number]  PCP:  Rodrigo Ran, MD  Cardiologist:  Little Ishikawa, MD  Electrophysiologist:  None   Referring MD: Rodrigo Ran, MD   Chief Complaint  Patient presents with   Pre-op Exam    History of Present Illness:    Shane Sampson is a 67 y.o. male with a hx of pheochromocytoma, Takotsubo cardiomyopathy, hypertension, hyperlipidemia, CKD who presents for follow-up.  He was referred by Dr. Waynard Edwards for evaluation of coronary artery disease, initially seen on 01/02/2020.  He underwent a calcium score in 12/29/2019, which showed calcium score 1065 (94th percentile).  His atorvastatin was increased from 10 mg to 40 mg and he was referred to cardiology for further evaluation.  Smoked 1 pack/day for 6 years, quit in 1979-06-18.  Father died of brain anuerysm rupture at 40.  Mother had MI at 36.    Lexiscan Myoview on 01/15/2020 showed normal perfusion, EF 46%. TTE 01/09/2020 showed LVEF 55 to 60%, mild hypertrophy septal hypertrophy, grade 2 diastolic dysfunction, normal RV function, no significant valvular disease.  He was admitted 02/2023 with chest pain, troponin peaked at 1420.  Cardiac catheterization showed no significant CAD.  Echocardiogram concerning for Takotsubo cardiomyopathy versus myocarditis, EF 45 to 50%.  Cardiac MRI 03/02/2023 suggested mid ventricular Takotsubo cardiomyopathy, LVEF 34%, RVEF 50%, asymmetric LVH measuring 17 mm in basal septum (8 mm in posterior wall) which meets criteria for hypertrophic cardiomyopathy.  He was readmitted on 03/15/2023 with left upper quadrant pain, found to have adrenal hemorrhage with course complicated by PEA arrest and hemorrhagic shock.  Underwent IR embolization for left adrenal hemorrhage.  Repeat echocardiogram was attempted 03/16/2023, but extremely poor acoustic windows, unable to evaluate LV systolic function.  GDMT was held in setting of shock.  He was  transferred to [facility] on 6/6 and evaluated by general surgery, who recommended no acute surgical intervention in setting of recent hemorrhage.  Echocardiogram 03/18/2023 at [facility] showed LV systolic function had normalized, mild LVH, normal diastolic function, normal RV function, LVOT gradient 51 mmHg at rest and 100 mmHg with Valsalva.  He had outpatient follow-up with endocrinology at [facility], underwent 24-hour urine metanephrines which were significantly elevated, consistent with pheochromocytoma.  He was started on doxazosin.  He was seen by endocrine surgeon, who recommended surgery after allowing time for hematoma to resolve.  Echocardiogram 05/31/2023 showed EF 45 to 50%, moderate asymmetric LVH, grade 1 diastolic dysfunction, normal RV function, mild to moderate MR.  Since last clinic visit, he reports he is doing well.  He is walking 5 miles per day.  Denies any chest pain, dyspnea, lightheadedness, syncope, lower extremity edema, or palpitations.  BP Readings from Last 3 Encounters:  06/01/23 118/74  03/28/23 102/62  03/18/23 131/83   Past Medical History:  Diagnosis Date   Hypertension     Past Surgical History:  Procedure Laterality Date   HERNIA REPAIR     IR ANGIOGRAM VISCERAL SELECTIVE  03/15/2023   IR EMBO TUMOR ORGAN ISCHEMIA INFARCT INC GUIDE ROADMAPPING  03/15/2023   IR US GUIDE VASC ACCESS RIGHT  03/15/2023   LEFT HEART CATH AND CORONARY ANGIOGRAPHY N/A 03/01/2023   Procedure: LEFT HEART CATH AND CORONARY ANGIOGRAPHY;  Surgeon: Corky Crafts, MD;  Location: [facility] INVASIVE CV LAB;  Service: Cardiovascular;  Laterality: N/A;   RADIOLOGY WITH ANESTHESIA N/A 03/15/2023   Procedure: IR WITH ANESTHESIA;  Surgeon: Radiologist,  Medication, MD;  Location: [facility] OR;  Service: Radiology;  Laterality: N/A;    Current Medications: Current Meds  Medication Sig   acetaminophen (TYLENOL) 500 MG tablet Take 2 tablets (1,000 mg total) by mouth every 6 (six) hours.   amLODipine (NORVASC) 5 MG tablet  Take 1 tablet by mouth daily.   atorvastatin (LIPITOR) 80 MG tablet Take 80 mg by mouth daily.   carvedilol (COREG) 25 MG tablet Take 25 mg by mouth 2 (two) times daily with a meal.   doxazosin (CARDURA) 1 MG tablet Take 1 mg by mouth daily.   doxazosin (CARDURA) 2 MG tablet Take 6 mg by mouth daily.   ezetimibe (ZETIA) 10 MG tablet Take 1 tablet by mouth daily.   vitamin B-12 (CYANOCOBALAMIN) 250 MCG tablet Take 250 mcg by mouth daily.     Allergies:   Patient has no known allergies.   Social History   Socioeconomic History   Marital status: Married    Spouse name: Not on file   Number of children: Not on file   Years of education: Not on file   Highest education level: Not on file  Occupational History   Not on file  Tobacco Use   Smoking status: Never   Smokeless tobacco: Never  Substance and Sexual Activity   Alcohol use: Yes    Alcohol/week: 1.0 standard drink of alcohol    Types: 1 Standard drinks or equivalent per week   Drug use: No   Sexual activity: Not on file  Other Topics Concern   Not on file  Social History Narrative   Not on file   Social Determinants of Health   Financial Resource Strain: Low Risk  (03/29/2023)   Received from [facility], [facility]   Overall Financial Resource Strain (CARDIA)    Difficulty of Paying Living Expenses: Not hard at all  Food Insecurity: No Food Insecurity (03/29/2023)   Received from [facility], [facility]   Hunger Vital Sign    Worried About Running Out of Food in the Last Year: Never true    Ran Out of Food in the Last Year: Never true  Transportation Needs: No Transportation Needs (03/29/2023)   Received from [facility], [facility]   [facility] - Transportation    In the past 12 months, has lack of transportation kept you from medical appointments or from getting medications?: No    Lack of Transportation  (Non-Medical): No  Physical Activity: Not on file  Stress: Not on file  Social Connections: Not on file     Family History: Father died of brain anuerysm rupture at 87.  Mother had MI at 84.    ROS:   Please see the history of present illness.     All other systems reviewed and are negative.  EKGs/Labs/Other Studies Reviewed:    The following studies were reviewed today:   EKG:   04/14/2022: Normal sinus rhythm, rate 72, no ST abnormality  Calcium score: 1. Age advanced coronary artery calcium. Coronary calcium score is 1065 and this is at percentile 94 for patients of the same age, gender and ethnicity. 2. Several small pulmonary nodules scattered throughout the visualized lungs. Some of these pulmonary nodules are calcified. These small pulmonary nodules are indeterminate but could represent postinflammatory changes and granulomas. No follow-up needed if patient is low-risk (and has no known or suspected primary neoplasm). Non-contrast chest CT can be considered in  12 months if patient is high-risk. This recommendation follows the consensus statement: Guidelines for Management of Incidental Pulmonary Nodules Detected on CT Images: From the Fleischner Society 2017; Radiology 2017; 284:228-243. 3.  Aortic Atherosclerosis (ICD10-I70.0).  Recent Labs: 03/17/2023: ALT 21; Magnesium 2.0 03/28/2023: BUN 17; Creatinine, Ser 1.13; Hemoglobin 9.8; Platelets 535; Potassium 4.8; Sodium 138  Recent Lipid Panel    Component Value Date/Time   CHOL 90 03/01/2023 0124   TRIG 69 03/16/2023 0318   HDL 30 (L) 03/01/2023 0124   CHOLHDL 3.0 03/01/2023 0124   VLDL 17 03/01/2023 0124   LDLCALC 43 03/01/2023 0124    Physical Exam:    VS:  BP 118/74   Pulse 69   Ht 6' (1.829 m)   Wt 173 lb 6.4 oz (78.7 kg)   SpO2 96%   BMI 23.52 kg/m     Wt Readings from Last 3 Encounters:  06/01/23 173 lb 6.4 oz (78.7 kg)  03/28/23 166 lb 9.6 oz (75.6 kg)  03/15/23 164 lb (74.4 kg)     GEN:   Well nourished, well developed in no acute distress HEENT: Normal NECK: No JVD; No carotid bruits LYMPHATICS: No lymphadenopathy CARDIAC: RRR, no murmurs, rubs, gallops RESPIRATORY:  Clear to auscultation without rales, wheezing or rhonchi  ABDOMEN: Soft, non-tender, non-distended MUSCULOSKELETAL:  No edema; No deformity  SKIN: Warm and dry NEUROLOGIC:  Alert and oriented x 3 PSYCHIATRIC:  Normal affect   ASSESSMENT:    1. Pre-op evaluation   2. Takotsubo cardiomyopathy   3. HOCM (hypertrophic obstructive cardiomyopathy) (HCC)   4. Pheochromocytoma, unspecified laterality   5. Coronary artery disease involving native coronary artery of native heart without angina pectoris      PLAN:    Preop evaluation: Prior to pheochromocytoma resection.  Developed Takotsubo cardiomyopathy in May 2024, EF had normalized on echocardiogram 03/2023.  LHC 02/2023 showed no significant CAD.  He underwent echocardiogram yesterday, was read as EF 45 to 50% but appears low normal systolic function by my read.  Has had cardiac MRI concerning for HCM and had significant LVOT obstruction on echocardiogram 03/2023 at [facility] but was in setting of admission with hemorrhagic shock.  Echocardiogram yesterday showed no LVOT obstruction, suspect was caused by decreased preload in setting of hemorrhagic shock.  He is very active, reports walking 5 miles per day and denies any exertional chest pain or dyspnea.   -No further cardiac workup recommended prior to surgery.  Takotsubo cardiomyopathy: He was admitted 02/2023 with chest pain, troponin peaked at 1420.  Cardiac catheterization showed no significant CAD.  Echocardiogram concerning for Takotsubo cardiomyopathy versus myocarditis, EF 45 to 50%.  Cardiac MRI 03/02/2023 suggested mid ventricular Takotsubo cardiomyopathy, LVEF 34%, RVEF 50%, asymmetric LVH measuring 17 mm in basal septum (8 mm in posterior wall) which meets criteria for hypertrophic cardiomyopathy.   Echocardiogram 03/18/2023 at [facility] showed LV systolic function had normalized  HOCM: Cardiac MRI 03/02/2023 suggested mid ventricular Takotsubo cardiomyopathy, LVEF 34%, RVEF 50%, asymmetric LVH measuring 17 mm in basal septum (8 mm in posterior wall) which meets criteria for hypertrophic cardiomyopathy.  Echocardiogram 03/18/2023 at [facility] showed LV systolic function had normalized, mild LVH, normal diastolic function, normal RV function, LVOT gradient 51 mmHg at rest and 100 mmHg with Valsalva. -Echo with elevated LVOT gradients was in setting of admission with hemorrhagic shock.  Repeat echo yesterday shows no LVOT obstruction.  Would encourage to stay well hydrated to prevent LVOT obstruction  Pheochromocytoma: Admission in June 2024 with hemorrhagic  shock from left adrenal hemorrhage.  Follows with endocrinology and endocrine surgeon at [facility], planning surgery after allowing time for hematoma to resolve.  He is on doxazosin  CAD: Calcium score 1065 (94th percentile).  He is asymptomatic, but given significantly elevated calcium score Lexiscan Myoview was ordered 01/15/20, which showed normal perfusion.  Admission with Takotsubo cardiomyopathy 02/2023 as above.  LHC that admission showed 30% mid LAD stenosis and diffuse mild to moderate CAD. -Continue ASA, statin   Hypertension: On Coreg 25 mg twice daily, amlodipine 5 mg daily, and doxazosin 7 mg daily   AKI: Creatinine up to 3.75 during admission 03/2023.  Renal function has improved, most recently 1.0 on 03/22/2023  Hyperlipidemia:  Continue atorvastatin 40 mg daily and zetia 10 mg daily.  LDL 43 03/01/2023   RTC in 4 months  Medication Adjustments/Labs and Tests Ordered: Current medicines are reviewed at length with the patient today.  Concerns regarding medicines are outlined above.  No orders of the defined types were placed in this encounter.  No orders of the defined types were placed in this encounter.   Patient Instructions  Medication  Instructions:  Continue same medications *If you need a refill on your cardiac medications before your next appointment, please call your pharmacy*   Lab Work: None ordered   Testing/Procedures: None ordered   Follow-Up: At [facility], you and your health needs are our priority.  As part of our continuing mission to provide you with exceptional heart care, we have created designated Provider Care Teams.  These Care Teams include your primary Cardiologist (physician) and Advanced Practice Providers (APPs -  Physician Assistants and Nurse Practitioners) who all work together to provide you with the care you need, when you need it.  We recommend signing up for the patient portal called "MyChart".  Sign up information is provided on this After Visit Summary.  MyChart is used to connect with patients for Virtual Visits (Telemedicine).  Patients are able to view lab/test results, encounter notes, upcoming appointments, etc.  Non-urgent messages can be sent to your provider as well.   To learn more about what you can do with MyChart, go to [email or web address].    Your next appointment:  4 months    Provider:  Dr.Brion Sossamon     Signed, Little Ishikawa, MD  06/02/2023 5:43 PM    [facility]

## 2023-05-31 ENCOUNTER — Ambulatory Visit (HOSPITAL_COMMUNITY): Payer: 59 | Attending: Cardiology

## 2023-05-31 DIAGNOSIS — I5181 Takotsubo syndrome: Secondary | ICD-10-CM | POA: Insufficient documentation

## 2023-05-31 LAB — ECHOCARDIOGRAM COMPLETE
Area-P 1/2: 2.12 cm2
S' Lateral: 3.5 cm

## 2023-06-01 ENCOUNTER — Ambulatory Visit: Payer: 59 | Attending: Cardiology | Admitting: Cardiology

## 2023-06-01 VITALS — BP 118/74 | HR 69 | Ht 72.0 in | Wt 173.4 lb

## 2023-06-01 DIAGNOSIS — D3500 Benign neoplasm of unspecified adrenal gland: Secondary | ICD-10-CM | POA: Diagnosis not present

## 2023-06-01 DIAGNOSIS — I5181 Takotsubo syndrome: Secondary | ICD-10-CM

## 2023-06-01 DIAGNOSIS — I2510 Atherosclerotic heart disease of native coronary artery without angina pectoris: Secondary | ICD-10-CM

## 2023-06-01 DIAGNOSIS — Z01818 Encounter for other preprocedural examination: Secondary | ICD-10-CM

## 2023-06-01 DIAGNOSIS — I421 Obstructive hypertrophic cardiomyopathy: Secondary | ICD-10-CM | POA: Diagnosis not present

## 2023-06-01 NOTE — Patient Instructions (Signed)
Medication Instructions:  Continue same medications *If you need a refill on your cardiac medications before your next appointment, please call your pharmacy*   Lab Work: None ordered   Testing/Procedures: None ordered   Follow-Up: At [facility], you and your health needs are our priority.  As part of our continuing mission to provide you with exceptional heart care, we have created designated Provider Care Teams.  These Care Teams include your primary Cardiologist (physician) and Advanced Practice Providers (APPs -  Physician Assistants and Nurse Practitioners) who all work together to provide you with the care you need, when you need it.  We recommend signing up for the patient portal called "MyChart".  Sign up information is provided on this After Visit Summary.  MyChart is used to connect with patients for Virtual Visits (Telemedicine).  Patients are able to view lab/test results, encounter notes, upcoming appointments, etc.  Non-urgent messages can be sent to your provider as well.   To learn more about what you can do with MyChart, go to [email or web address].    Your next appointment:  4 months    Provider:  Dr.Schumann     

## 2023-09-11 NOTE — Progress Notes (Unsigned)
Cardiology Office Note:    Date:  09/13/2023   ID:  Garey, Turk 31-Oct-1955, MRN [id number]  PCP:  Rodrigo Ran, MD  Cardiologist:  Little Ishikawa, MD  Electrophysiologist:  None   Referring MD: Rodrigo Ran, MD   Chief Complaint  Patient presents with   Coronary Artery Disease    History of Present Illness:    Shane Sampson is a 67 y.o. male with a hx of pheochromocytoma, Takotsubo cardiomyopathy, hypertension, hyperlipidemia, CKD who presents for follow-up.  He was referred by Dr. Waynard Edwards for evaluation of coronary artery disease, initially seen on 01/02/2020.  He underwent a calcium score in 12/29/2019, which showed calcium score 1065 (94th percentile).  His atorvastatin was increased from 10 mg to 40 mg and he was referred to cardiology for further evaluation.  Smoked 1 pack/day for 6 years, quit in 1979-09-24.  Father died of brain anuerysm rupture at 38.  Mother had MI at 42.    Lexiscan Myoview on 01/15/2020 showed normal perfusion, EF 46%. TTE 01/09/2020 showed LVEF 55 to 60%, mild hypertrophy septal hypertrophy, grade 2 diastolic dysfunction, normal RV function, no significant valvular disease.  He was admitted 02/2023 with chest pain, troponin peaked at 1420.  Cardiac catheterization showed no significant CAD.  Echocardiogram concerning for Takotsubo cardiomyopathy versus myocarditis, EF 45 to 50%.  Cardiac MRI 03/02/2023 suggested mid ventricular Takotsubo cardiomyopathy, LVEF 34%, RVEF 50%, asymmetric LVH measuring 17 mm in basal septum (8 mm in posterior wall) which meets criteria for hypertrophic cardiomyopathy.  He was readmitted on 03/15/2023 with left upper quadrant pain, found to have adrenal hemorrhage with course complicated by PEA arrest and hemorrhagic shock.  Underwent IR embolization for left adrenal hemorrhage.  Repeat echocardiogram was attempted 03/16/2023, but extremely poor acoustic windows, unable to evaluate LV systolic function.  GDMT was held in setting of shock.   He was transferred to [facility] on 6/6 and evaluated by general surgery, who recommended no acute surgical intervention in setting of recent hemorrhage.  Echocardiogram 03/18/2023 at [facility] showed LV systolic function had normalized, mild LVH, normal diastolic function, normal RV function, LVOT gradient 51 mmHg at rest and 100 mmHg with Valsalva.  He had outpatient follow-up with endocrinology at [facility], underwent 24-hour urine metanephrines which were significantly elevated, consistent with pheochromocytoma.  He was started on doxazosin.  He was seen by endocrine surgeon, who recommended surgery after allowing time for hematoma to resolve.  Echocardiogram 05/31/2023 showed EF 45 to 50%, moderate asymmetric LVH, grade 1 diastolic dysfunction, normal RV function, mild to moderate MR.  Since last clinic visit, he reports he is doing well.  He is planned for surgery for his pheochromocytoma on 11/24/2023.  Denies any chest pain, dyspnea, lightheadedness, syncope, lower extremity edema, palpitations.  Walking 3-4 miles 5 days per week.  Denies any exertional symptoms.  BP Readings from Last 3 Encounters:  09/13/23 122/74  06/01/23 118/74  03/28/23 102/62   Past Medical History:  Diagnosis Date   Hypertension     Past Surgical History:  Procedure Laterality Date   HERNIA REPAIR     IR ANGIOGRAM VISCERAL SELECTIVE  03/15/2023   IR EMBO TUMOR ORGAN ISCHEMIA INFARCT INC GUIDE ROADMAPPING  03/15/2023   IR US GUIDE VASC ACCESS RIGHT  03/15/2023   LEFT HEART CATH AND CORONARY ANGIOGRAPHY N/A 03/01/2023   Procedure: LEFT HEART CATH AND CORONARY ANGIOGRAPHY;  Surgeon: Corky Crafts, MD;  Location: [facility] INVASIVE CV LAB;  Service: Cardiovascular;  Laterality: N/A;  RADIOLOGY WITH ANESTHESIA N/A 03/15/2023   Procedure: IR WITH ANESTHESIA;  Surgeon: Radiologist, Medication, MD;  Location: [facility] OR;  Service: Radiology;  Laterality: N/A;    Current Medications: Current Meds  Medication Sig   acetaminophen (TYLENOL) 500 MG  tablet Take 2 tablets (1,000 mg total) by mouth every 6 (six) hours.   amLODipine (NORVASC) 5 MG tablet Take 1 tablet by mouth daily.   atorvastatin (LIPITOR) 80 MG tablet Take 80 mg by mouth daily.   carvedilol (COREG) 25 MG tablet Take 25 mg by mouth 2 (two) times daily with a meal.   doxazosin (CARDURA) 1 MG tablet Take 1 mg by mouth daily.   doxazosin (CARDURA) 2 MG tablet Take 6 mg by mouth daily. Pt takes 1.5 tab in morning and 3 tabs a night   ezetimibe (ZETIA) 10 MG tablet Take 1 tablet by mouth daily.   sodium chloride 1 g tablet Take 1 g by mouth 3 (three) times daily with meals.   vitamin B-12 (CYANOCOBALAMIN) 250 MCG tablet Take 250 mcg by mouth daily.     Allergies:   Patient has no known allergies.   Social History   Socioeconomic History   Marital status: Married    Spouse name: Not on file   Number of children: Not on file   Years of education: Not on file   Highest education level: Not on file  Occupational History   Not on file  Tobacco Use   Smoking status: Never   Smokeless tobacco: Never  Substance and Sexual Activity   Alcohol use: Yes    Alcohol/week: 1.0 standard drink of alcohol    Types: 1 Standard drinks or equivalent per week   Drug use: No   Sexual activity: Not on file  Other Topics Concern   Not on file  Social History Narrative   Not on file   Social Determinants of Health   Financial Resource Strain: Low Risk  (03/29/2023)   Received from [facility], [facility]   Overall Financial Resource Strain (CARDIA)    Difficulty of Paying Living Expenses: Not hard at all  Food Insecurity: No Food Insecurity (03/29/2023)   Received from [facility], [facility]   Hunger Vital Sign    Worried About Running Out of Food in the Last Year: Never true    Ran Out of Food in the Last Year: Never true  Transportation Needs: No Transportation Needs (03/29/2023)   Received from [facility], [facility]   [facility] - Transportation    In the past 12 months, has lack of transportation kept you from medical appointments or from getting medications?: No    Lack of Transportation (Non-Medical): No  Physical Activity: Not on file  Stress: Not on file  Social Connections: Not on file     Family History: Father died of brain anuerysm rupture at 34.  Mother had MI at 85.    ROS:   Please see the history of present illness.     All other systems reviewed and are negative.  EKGs/Labs/Other Studies Reviewed:    The following studies were reviewed today:   EKG:   04/14/2022: Normal sinus rhythm, rate 72, no ST abnormality  Calcium score: 1. Age advanced coronary artery calcium. Coronary calcium score is 1065 and this is at percentile 94 for patients of the same age, gender and ethnicity. 2. Several small pulmonary nodules scattered throughout the visualized  lungs. Some of these pulmonary nodules are calcified. These small pulmonary nodules are indeterminate but could represent postinflammatory changes and granulomas. No follow-up needed if patient is low-risk (and has no known or suspected primary neoplasm). Non-contrast chest CT can be considered in 12 months if patient is high-risk. This recommendation follows the consensus statement: Guidelines for Management of Incidental Pulmonary Nodules Detected on CT Images: From the Fleischner Society 2017; Radiology 2017; 284:228-243. 3.  Aortic Atherosclerosis (ICD10-I70.0).  Recent Labs: 03/17/2023: ALT 21; Magnesium 2.0 03/28/2023: BUN 17; Creatinine, Ser 1.13; Hemoglobin 9.8; Platelets 535; Potassium 4.8; Sodium 138  Recent Lipid Panel    Component Value Date/Time   CHOL 90 03/01/2023 0124   TRIG 69 03/16/2023 0318   HDL 30 (L) 03/01/2023 0124   CHOLHDL 3.0 03/01/2023 0124   VLDL 17 03/01/2023 0124   LDLCALC 43 03/01/2023 0124    Physical Exam:    VS:  BP 122/74 (BP Location:  Left Arm, Patient Position: Sitting, Cuff Size: Normal)   Ht 6' (1.829 m)   Wt 184 lb 6.4 oz (83.6 kg)   SpO2 97%   BMI 25.01 kg/m     Wt Readings from Last 3 Encounters:  09/13/23 184 lb 6.4 oz (83.6 kg)  06/01/23 173 lb 6.4 oz (78.7 kg)  03/28/23 166 lb 9.6 oz (75.6 kg)     GEN:  Well nourished, well developed in no acute distress HEENT: Normal NECK: No JVD; No carotid bruits LYMPHATICS: No lymphadenopathy CARDIAC: RRR, no murmurs, rubs, gallops RESPIRATORY:  Clear to auscultation without rales, wheezing or rhonchi  ABDOMEN: Soft, non-tender, non-distended MUSCULOSKELETAL:  No edema; No deformity  SKIN: Warm and dry NEUROLOGIC:  Alert and oriented x 3 PSYCHIATRIC:  Normal affect   ASSESSMENT:    1. Coronary artery disease involving native coronary artery of native heart without angina pectoris   2. Pre-op evaluation   3. HOCM (hypertrophic obstructive cardiomyopathy) (HCC)       PLAN:    Preop evaluation: Prior to pheochromocytoma resection.  Developed Takotsubo cardiomyopathy in May 2024, EF had normalized on echocardiogram 03/2023.  LHC 02/2023 showed no significant CAD.  He underwent echocardiogram yesterday, was read as EF 45 to 50% but appears low normal systolic function by my read.  Has had cardiac MRI concerning for HCM and had significant LVOT obstruction on echocardiogram 03/2023 at [facility] but was in setting of admission with hemorrhagic shock.  Echocardiogram yesterday showed no LVOT obstruction, suspect was caused by decreased preload in setting of hemorrhagic shock.  He is very active, reports walking 5 miles per day and denies any exertional chest pain or dyspnea.   -No further cardiac workup recommended prior to surgery.  Takotsubo cardiomyopathy: He was admitted 02/2023 with chest pain, troponin peaked at 1420.  Cardiac catheterization showed no significant CAD.  Echocardiogram concerning for Takotsubo cardiomyopathy versus myocarditis, EF 45 to 50%.  Cardiac MRI  03/02/2023 suggested mid ventricular Takotsubo cardiomyopathy, LVEF 34%, RVEF 50%, asymmetric LVH measuring 17 mm in basal septum (8 mm in posterior wall) which meets criteria for hypertrophic cardiomyopathy.  Echocardiogram 03/18/2023 at [facility] showed LV systolic function had normalized  HOCM: Cardiac MRI 03/02/2023 suggested mid ventricular Takotsubo cardiomyopathy, LVEF 34%, RVEF 50%, asymmetric LVH measuring 17 mm in basal septum (8 mm in posterior wall) which meets criteria for hypertrophic cardiomyopathy.  Echocardiogram 03/18/2023 at [facility] showed LV systolic function had normalized, mild LVH, normal diastolic function, normal RV function, LVOT gradient 51 mmHg at rest and 100 mmHg with  Valsalva. -Echo with elevated LVOT gradients was in setting of admission with hemorrhagic shock.  Repeat echo 05/2023 shows no LVOT obstruction.  Would encourage to stay well hydrated to prevent LVOT obstruction  Pheochromocytoma: Admission in June 2024 with hemorrhagic shock from left adrenal hemorrhage.  Follows with endocrinology and endocrine surgeon at [facility], planning surgery after allowing time for hematoma to resolve.  Surgery planned for 09/29/2023.  He is on doxazosin  CAD: Calcium score 1065 (94th percentile).  He is asymptomatic, but given significantly elevated calcium score Lexiscan Myoview was ordered 01/15/20, which showed normal perfusion.  Admission with Takotsubo cardiomyopathy 02/2023 as above.  LHC that admission showed 30% mid LAD stenosis and diffuse mild to moderate CAD. -Continue ASA, statin   Hypertension: On Coreg 25 mg twice daily, amlodipine 5 mg daily, and doxazosin 7 mg daily   AKI: Creatinine up to 3.75 during admission 03/2023.  Renal function has improved, most recently 1.3 on 09/02/2023  Hyperlipidemia:  Continue atorvastatin 40 mg daily and zetia 10 mg daily.  LDL 43 03/01/2023   RTC in 6 months  Medication Adjustments/Labs and Tests Ordered: Current medicines are reviewed at length with  the patient today.  Concerns regarding medicines are outlined above.  No orders of the defined types were placed in this encounter.  No orders of the defined types were placed in this encounter.   Patient Instructions  Medication Instructions:  Continue current medications *If you need a refill on your cardiac medications before your next appointment, please call your pharmacy*   Lab Work: none If you have labs (blood work) drawn today and your tests are completely normal, you will receive your results only by: MyChart Message (if you have MyChart) OR A paper copy in the mail If you have any lab test that is abnormal or we need to change your treatment, we will call you to review the results.   Testing/Procedures: none   Follow-Up: At [facility], you and your health needs are our priority.  As part of our continuing mission to provide you with exceptional heart care, we have created designated Provider Care Teams.  These Care Teams include your primary Cardiologist (physician) and Advanced Practice Providers (APPs -  Physician Assistants and Nurse Practitioners) who all work together to provide you with the care you need, when you need it.  We recommend signing up for the patient portal called "MyChart".  Sign up information is provided on this After Visit Summary.  MyChart is used to connect with patients for Virtual Visits (Telemedicine).  Patients are able to view lab/test results, encounter notes, upcoming appointments, etc.  Non-urgent messages can be sent to your provider as well.   To learn more about what you can do with MyChart, go to [email or web address].    Your next appointment:   6 month(s)  Provider:   Little Ishikawa, MD     Other Instructions none    Signed, Little Ishikawa, MD  09/13/2023 5:46 PM    [facility]

## 2023-09-13 ENCOUNTER — Ambulatory Visit: Payer: 59 | Attending: Cardiology | Admitting: Cardiology

## 2023-09-13 VITALS — BP 122/74 | Ht 72.0 in | Wt 184.4 lb

## 2023-09-13 DIAGNOSIS — Z01818 Encounter for other preprocedural examination: Secondary | ICD-10-CM

## 2023-09-13 DIAGNOSIS — I421 Obstructive hypertrophic cardiomyopathy: Secondary | ICD-10-CM

## 2023-09-13 DIAGNOSIS — I2510 Atherosclerotic heart disease of native coronary artery without angina pectoris: Secondary | ICD-10-CM | POA: Diagnosis not present

## 2023-09-13 NOTE — Patient Instructions (Signed)
Medication Instructions:  Continue current medications *If you need a refill on your cardiac medications before your next appointment, please call your pharmacy*   Lab Work: none If you have labs (blood work) drawn today and your tests are completely normal, you will receive your results only by: MyChart Message (if you have MyChart) OR A paper copy in the mail If you have any lab test that is abnormal or we need to change your treatment, we will call you to review the results.   Testing/Procedures: none   Follow-Up: At [facility], you and your health needs are our priority.  As part of our continuing mission to provide you with exceptional heart care, we have created designated Provider Care Teams.  These Care Teams include your primary Cardiologist (physician) and Advanced Practice Providers (APPs -  Physician Assistants and Nurse Practitioners) who all work together to provide you with the care you need, when you need it.  We recommend signing up for the patient portal called "MyChart".  Sign up information is provided on this After Visit Summary.  MyChart is used to connect with patients for Virtual Visits (Telemedicine).  Patients are able to view lab/test results, encounter notes, upcoming appointments, etc.  Non-urgent messages can be sent to your provider as well.   To learn more about what you can do with MyChart, go to [email or web address].    Your next appointment:   6 month(s)  Provider:   Little Ishikawa, MD     Other Instructions none

## 2024-02-29 ENCOUNTER — Encounter: Payer: Self-pay | Admitting: Cardiology

## 2024-09-12 ENCOUNTER — Other Ambulatory Visit: Payer: Self-pay | Admitting: Nurse Practitioner

## 2024-09-12 DIAGNOSIS — R161 Splenomegaly, not elsewhere classified: Secondary | ICD-10-CM

## 2024-09-14 ENCOUNTER — Encounter: Payer: Self-pay | Admitting: Nurse Practitioner

## 2024-09-17 ENCOUNTER — Inpatient Hospital Stay
Admission: RE | Admit: 2024-09-17 | Discharge: 2024-09-17 | Attending: Nurse Practitioner | Admitting: Nurse Practitioner

## 2024-09-17 DIAGNOSIS — R161 Splenomegaly, not elsewhere classified: Secondary | ICD-10-CM

## 2024-09-17 MED ORDER — IOPAMIDOL (ISOVUE-370) INJECTION 76%
70.0000 mL | Freq: Once | INTRAVENOUS | Status: AC | PRN
  Administered 2024-09-17: 70 mL via INTRAVENOUS

## 2024-09-18 ENCOUNTER — Other Ambulatory Visit
